# Patient Record
Sex: Male | Born: 1950
Health system: Southern US, Community
[De-identification: ages and names within clinical notes are randomized; demographics above are authoritative.]

## PROBLEM LIST (undated history)

## (undated) ENCOUNTER — Ambulatory Visit: Source: Home / Self Care

## (undated) DIAGNOSIS — I10 Essential (primary) hypertension: Secondary | ICD-10-CM

## (undated) DIAGNOSIS — E119 Type 2 diabetes mellitus without complications: Secondary | ICD-10-CM

## (undated) DIAGNOSIS — H269 Unspecified cataract: Secondary | ICD-10-CM

## (undated) DIAGNOSIS — Z5189 Encounter for other specified aftercare: Secondary | ICD-10-CM

## (undated) DIAGNOSIS — R7302 Impaired glucose tolerance (oral): Secondary | ICD-10-CM

## (undated) DIAGNOSIS — Z9889 Other specified postprocedural states: Secondary | ICD-10-CM

## (undated) DIAGNOSIS — Z87442 Personal history of urinary calculi: Secondary | ICD-10-CM

## (undated) DIAGNOSIS — N289 Disorder of kidney and ureter, unspecified: Secondary | ICD-10-CM

## (undated) DIAGNOSIS — E7800 Pure hypercholesterolemia, unspecified: Secondary | ICD-10-CM

## (undated) DIAGNOSIS — J45909 Unspecified asthma, uncomplicated: Secondary | ICD-10-CM

## (undated) HISTORY — DX: Type 2 diabetes mellitus without complications: E11.9

## (undated) HISTORY — DX: Impaired glucose tolerance (oral): R73.02

## (undated) HISTORY — DX: Essential (primary) hypertension: I10

## (undated) HISTORY — DX: Unspecified cataract: H26.9

## (undated) HISTORY — DX: Unspecified asthma, uncomplicated: J45.909

## (undated) HISTORY — DX: Encounter for other specified aftercare: Z51.89

## (undated) HISTORY — DX: Disorder of kidney and ureter, unspecified: N28.9

## (undated) HISTORY — PX: UMBILICAL HERNIA REPAIR: SUR1181

## (undated) HISTORY — DX: Other specified postprocedural states: Z98.89

## (undated) HISTORY — DX: Personal history of urinary calculi: Z87.442

## (undated) HISTORY — PX: SPLENECTOMY: SUR1306

## (undated) HISTORY — DX: Pure hypercholesterolemia, unspecified: E78.00

---

## 2002-06-24 ENCOUNTER — Encounter: Payer: Self-pay | Admitting: General Surgery

## 2002-06-24 ENCOUNTER — Encounter: Admission: RE | Admit: 2002-06-24 | Discharge: 2002-06-24 | Payer: Self-pay | Admitting: General Surgery

## 2005-02-28 ENCOUNTER — Ambulatory Visit: Payer: Self-pay | Admitting: Internal Medicine

## 2006-06-27 ENCOUNTER — Ambulatory Visit: Payer: Self-pay | Admitting: Internal Medicine

## 2006-09-24 ENCOUNTER — Ambulatory Visit: Payer: Self-pay | Admitting: Internal Medicine

## 2007-09-20 ENCOUNTER — Encounter: Payer: Self-pay | Admitting: *Deleted

## 2007-09-20 DIAGNOSIS — Z9889 Other specified postprocedural states: Secondary | ICD-10-CM | POA: Insufficient documentation

## 2007-09-20 DIAGNOSIS — E7800 Pure hypercholesterolemia, unspecified: Secondary | ICD-10-CM | POA: Insufficient documentation

## 2007-09-20 DIAGNOSIS — I10 Essential (primary) hypertension: Secondary | ICD-10-CM | POA: Insufficient documentation

## 2007-09-20 DIAGNOSIS — Z9089 Acquired absence of other organs: Secondary | ICD-10-CM | POA: Insufficient documentation

## 2007-09-20 HISTORY — DX: Other specified postprocedural states: Z98.89

## 2007-09-20 HISTORY — DX: Pure hypercholesterolemia, unspecified: E78.00

## 2007-09-20 HISTORY — DX: Essential (primary) hypertension: I10

## 2007-12-16 ENCOUNTER — Ambulatory Visit: Payer: Self-pay | Admitting: Internal Medicine

## 2007-12-16 DIAGNOSIS — F528 Other sexual dysfunction not due to a substance or known physiological condition: Secondary | ICD-10-CM | POA: Insufficient documentation

## 2009-01-10 ENCOUNTER — Ambulatory Visit: Payer: Self-pay | Admitting: Internal Medicine

## 2009-01-10 DIAGNOSIS — J45909 Unspecified asthma, uncomplicated: Secondary | ICD-10-CM

## 2009-01-10 DIAGNOSIS — Z9109 Other allergy status, other than to drugs and biological substances: Secondary | ICD-10-CM | POA: Insufficient documentation

## 2009-01-10 HISTORY — DX: Unspecified asthma, uncomplicated: J45.909

## 2009-01-11 ENCOUNTER — Encounter: Payer: Self-pay | Admitting: Internal Medicine

## 2009-01-12 LAB — CONVERTED CEMR LAB
ALT: 28 units/L (ref 0–53)
AST: 28 units/L (ref 0–37)
Albumin: 3.9 g/dL (ref 3.5–5.2)
Alkaline Phosphatase: 127 units/L — ABNORMAL HIGH (ref 39–117)
BUN: 12 mg/dL (ref 6–23)
Bacteria, UA: NEGATIVE
Basophils Absolute: 0.1 10*3/uL (ref 0.0–0.1)
Basophils Relative: 0.6 % (ref 0.0–3.0)
Bilirubin Urine: NEGATIVE
Bilirubin, Direct: 0.2 mg/dL (ref 0.0–0.3)
CO2: 29 meq/L (ref 19–32)
Calcium: 9.5 mg/dL (ref 8.4–10.5)
Chloride: 102 meq/L (ref 96–112)
Cholesterol: 223 mg/dL (ref 0–200)
Creatinine, Ser: 0.8 mg/dL (ref 0.4–1.5)
Crystals: NEGATIVE
Direct LDL: 151.7 mg/dL
Eosinophils Absolute: 0.5 10*3/uL (ref 0.0–0.7)
Eosinophils Relative: 4.3 % (ref 0.0–5.0)
GFR calc Af Amer: 129 mL/min
GFR calc non Af Amer: 106 mL/min
Glucose, Bld: 106 mg/dL — ABNORMAL HIGH (ref 70–99)
HCT: 44.7 % (ref 39.0–52.0)
HDL: 32.5 mg/dL — ABNORMAL LOW (ref >39.0)
Hemoglobin: 15.4 g/dL (ref 13.0–17.0)
Ketones, ur: NEGATIVE mg/dL
Leukocytes, UA: NEGATIVE
Lymphocytes Relative: 35.2 % (ref 12.0–46.0)
MCHC: 34.4 g/dL (ref 30.0–36.0)
MCV: 93.7 fL (ref 78.0–100.0)
Monocytes Absolute: 0.8 10*3/uL (ref 0.1–1.0)
Monocytes Relative: 6.7 % (ref 3.0–12.0)
Mucus, UA: NEGATIVE
Neutro Abs: 6 10*3/uL (ref 1.4–7.7)
Neutrophils Relative %: 53.2 % (ref 43.0–77.0)
Nitrite: NEGATIVE
PSA: 1.17 ng/mL (ref 0.10–4.00)
Platelets: 264 10*3/uL (ref 150–400)
Potassium: 3.6 meq/L (ref 3.5–5.1)
RBC: 4.76 M/uL (ref 4.22–5.81)
RDW: 12.8 % (ref 11.5–14.6)
Sodium: 140 meq/L (ref 135–145)
Specific Gravity, Urine: 1.025 (ref 1.000–1.03)
TSH: 1.34 microintl units/mL (ref 0.35–5.50)
Total Bilirubin: 1 mg/dL (ref 0.3–1.2)
Total CHOL/HDL Ratio: 6.9
Total Protein, Urine: NEGATIVE mg/dL
Total Protein: 7.8 g/dL (ref 6.0–8.3)
Triglycerides: 138 mg/dL (ref 0–149)
Urine Glucose: NEGATIVE mg/dL
Urobilinogen, UA: 1 (ref 0.0–1.0)
VLDL: 28 mg/dL (ref 0–40)
WBC: 11.4 10*3/uL — ABNORMAL HIGH (ref 4.5–10.5)
pH: 6 (ref 5.0–8.0)

## 2009-01-28 ENCOUNTER — Encounter (INDEPENDENT_AMBULATORY_CARE_PROVIDER_SITE_OTHER): Payer: Self-pay | Admitting: *Deleted

## 2009-07-27 ENCOUNTER — Encounter (INDEPENDENT_AMBULATORY_CARE_PROVIDER_SITE_OTHER): Payer: Self-pay | Admitting: *Deleted

## 2009-11-26 HISTORY — PX: COLONOSCOPY: SHX174

## 2010-02-13 ENCOUNTER — Ambulatory Visit: Payer: Self-pay | Admitting: Internal Medicine

## 2010-02-13 DIAGNOSIS — Z87442 Personal history of urinary calculi: Secondary | ICD-10-CM | POA: Insufficient documentation

## 2010-02-13 DIAGNOSIS — R310 Gross hematuria: Secondary | ICD-10-CM

## 2010-02-13 DIAGNOSIS — N529 Male erectile dysfunction, unspecified: Secondary | ICD-10-CM | POA: Insufficient documentation

## 2010-02-13 HISTORY — DX: Personal history of urinary calculi: Z87.442

## 2010-02-13 LAB — CONVERTED CEMR LAB
ALT: 23 units/L (ref 0–53)
AST: 25 units/L (ref 0–37)
Albumin: 4 g/dL (ref 3.5–5.2)
Alkaline Phosphatase: 110 units/L (ref 39–117)
BUN: 12 mg/dL (ref 6–23)
Basophils Absolute: 0.1 10*3/uL (ref 0.0–0.1)
Basophils Relative: 0.7 % (ref 0.0–3.0)
Bilirubin Urine: NEGATIVE
Bilirubin, Direct: 0.2 mg/dL (ref 0.0–0.3)
CO2: 31 meq/L (ref 19–32)
Calcium: 9.5 mg/dL (ref 8.4–10.5)
Chloride: 105 meq/L (ref 96–112)
Cholesterol: 245 mg/dL — ABNORMAL HIGH (ref 0–200)
Creatinine, Ser: 0.9 mg/dL (ref 0.4–1.5)
Direct LDL: 193.9 mg/dL
Eosinophils Absolute: 0.2 10*3/uL (ref 0.0–0.7)
Eosinophils Relative: 2.3 % (ref 0.0–5.0)
GFR calc non Af Amer: 111.72 mL/min (ref >60)
Glucose, Bld: 116 mg/dL — ABNORMAL HIGH (ref 70–99)
HCT: 46 % (ref 39.0–52.0)
HDL: 38.7 mg/dL — ABNORMAL LOW (ref >39.00)
Hemoglobin: 15.3 g/dL (ref 13.0–17.0)
Ketones, ur: NEGATIVE mg/dL
Leukocytes, UA: NEGATIVE
Lymphocytes Relative: 43.7 % (ref 12.0–46.0)
Lymphs Abs: 4.5 10*3/uL — ABNORMAL HIGH (ref 0.7–4.0)
MCHC: 33.2 g/dL (ref 30.0–36.0)
MCV: 96.9 fL (ref 78.0–100.0)
Monocytes Absolute: 0.9 10*3/uL (ref 0.1–1.0)
Monocytes Relative: 9.3 % (ref 3.0–12.0)
Neutro Abs: 4.4 10*3/uL (ref 1.4–7.7)
Neutrophils Relative %: 44 % (ref 43.0–77.0)
Nitrite: NEGATIVE
PSA: 1.45 ng/mL (ref 0.10–4.00)
Platelets: 299 10*3/uL (ref 150.0–400.0)
Potassium: 3.8 meq/L (ref 3.5–5.1)
RBC: 4.75 M/uL (ref 4.22–5.81)
RDW: 13 % (ref 11.5–14.6)
Sodium: 143 meq/L (ref 135–145)
Specific Gravity, Urine: 1.025 (ref 1.000–1.030)
TSH: 1.53 microintl units/mL (ref 0.35–5.50)
Total Bilirubin: 1.3 mg/dL — ABNORMAL HIGH (ref 0.3–1.2)
Total CHOL/HDL Ratio: 6
Total Protein, Urine: NEGATIVE mg/dL
Total Protein: 8.2 g/dL (ref 6.0–8.3)
Triglycerides: 138 mg/dL (ref 0.0–149.0)
Urine Glucose: NEGATIVE mg/dL
Urobilinogen, UA: 1 (ref 0.0–1.0)
VLDL: 27.6 mg/dL (ref 0.0–40.0)
WBC: 10.1 10*3/uL (ref 4.5–10.5)
pH: 6 (ref 5.0–8.0)

## 2010-02-14 LAB — CONVERTED CEMR LAB
Sex Hormone Binding: 27 nmol/L (ref 13–71)
Testosterone Free: 52.4 pg/mL (ref 47.0–244.0)
Testosterone-% Free: 2.2 % (ref 1.6–2.9)
Testosterone: 242.85 ng/dL — ABNORMAL LOW (ref 350–890)

## 2010-02-15 ENCOUNTER — Encounter: Payer: Self-pay | Admitting: Internal Medicine

## 2010-03-03 ENCOUNTER — Encounter (INDEPENDENT_AMBULATORY_CARE_PROVIDER_SITE_OTHER): Payer: Self-pay | Admitting: *Deleted

## 2010-03-08 ENCOUNTER — Encounter: Payer: Self-pay | Admitting: Internal Medicine

## 2010-03-13 ENCOUNTER — Encounter (INDEPENDENT_AMBULATORY_CARE_PROVIDER_SITE_OTHER): Payer: Self-pay | Admitting: *Deleted

## 2010-03-16 ENCOUNTER — Ambulatory Visit: Payer: Self-pay | Admitting: Internal Medicine

## 2010-03-20 ENCOUNTER — Telehealth: Payer: Self-pay | Admitting: Internal Medicine

## 2010-03-22 ENCOUNTER — Encounter: Admission: RE | Admit: 2010-03-22 | Discharge: 2010-03-22 | Payer: Self-pay | Admitting: Internal Medicine

## 2010-03-22 ENCOUNTER — Encounter: Payer: Self-pay | Admitting: Internal Medicine

## 2010-03-22 DIAGNOSIS — N289 Disorder of kidney and ureter, unspecified: Secondary | ICD-10-CM

## 2010-03-22 DIAGNOSIS — N281 Cyst of kidney, acquired: Secondary | ICD-10-CM

## 2010-03-22 HISTORY — DX: Disorder of kidney and ureter, unspecified: N28.9

## 2010-03-28 ENCOUNTER — Encounter: Payer: Self-pay | Admitting: Internal Medicine

## 2010-03-30 ENCOUNTER — Ambulatory Visit: Payer: Self-pay | Admitting: Internal Medicine

## 2010-08-25 ENCOUNTER — Encounter: Admission: RE | Admit: 2010-08-25 | Discharge: 2010-08-25 | Payer: Self-pay | Admitting: Orthopedic Surgery

## 2010-12-26 NOTE — Progress Notes (Signed)
  Phone Note From Pharmacy   [facility]  [address] [phone][facility] Summary of Call: Pharmacy requested clarification on Lotrel 5-20 prescription. Sig does not match quantity pt wants a 30 day supply. Initial call taken by: Scharlene Gloss,  March 20, 2010 2:02 PM  Follow-up for Phone Call        ok to change to #60 - robin to handle Follow-up by: Corwin Levins MD,  March 20, 2010 2:14 PM    Prescriptions: LOTREL 5-20 MG  CAPS (AMLODIPINE BESY-BENAZEPRIL HCL) Take 2 tablets once daily  #60 x 11   Entered by:   Scharlene Gloss   Authorized by:   Corwin Levins MD   Signed by:   Scharlene Gloss on 03/20/2010   Method used:   [facility]ed to ...       [facility]  [address] [phone]* (retail)       [address]       [place], [place]  [id number]       Ph: [phone] or [phone]       Fax: [phone]   RxID:   [id number]

## 2010-12-26 NOTE — Procedures (Signed)
Summary: Colonoscopy  Patient: [name] Note: All result statuses are Final unless otherwise noted.  Tests: (1) Colonoscopy (COL)   COL Colonoscopy           DONE     [facility]     [address]     [place], [place]  [address]           COLONOSCOPY PROCEDURE REPORT           PATIENT:  Benjamin Patton, Benjamin Patton  MR#:  [id number]     BIRTHDATE:  02/08/1951, 58 yrs. old  GENDER:  male     ENDOSCOPIST:  Wilhemina Bonito. Eda Keys, MD     REF. BY:  Screening - recall     PROCEDURE DATE:  03/30/2010     PROCEDURE:  Average-risk screening colonoscopy     G0121     ASA CLASS:  Class II     INDICATIONS:  Routine Risk Screening     MEDICATIONS:   Fentanyl 75 mcg IV, Versed 8 mg IV           DESCRIPTION OF PROCEDURE:   After the risks benefits and     alternatives of the procedure were thoroughly explained, informed     consent was obtained.  Digital rectal exam was performed and     revealed no abnormalities.   The [id number] endoscope was     introduced through the anus and advanced to the cecum, which was     identified by both the appendix and ileocecal valve, limited by     fair prep.Time to cecum = 8:32 min.   The quality of the prep was     Moviprep fair.  The instrument was then slowly withdrawn (time =     10:30 min) as the colon was fully examined.     <<PROCEDUREIMAGES>>           FINDINGS:  A normal appearing cecum, ileocecal valve, and     appendiceal orifice were identified. The ascending, hepatic     flexure, transverse, splenic flexure, descending, sigmoid colon,     and rectum appeared unremarkable.  No polyps or cancers were seen.     fair / adequate prep.   Retroflexed views in the rectum revealed     no abnormalities.    The scope was then withdrawn from the patient     and the procedure completed.           COMPLICATIONS:  None     ENDOSCOPIC IMPRESSION:     1) Normal colon     2) No polyps or cancers     3) Fair / adequate prep           RECOMMENDATIONS:    1) Follow up colonoscopy in 5 years (enhanced prep)           ______________________________     Wilhemina Bonito. Eda Keys, MD           CC:  Corwin Levins, MD;The Patient           n.     Rosalie DoctorWilhemina Bonito. Eda Keys at 03/30/2010 09:31 AM           Junius Argyle, [id number]  Note: An exclamation mark (!) indicates a result that was not dispersed into the flowsheet. Document Creation Date: 03/30/2010 9:31 AM _______________________________________________________________________  (1) Order result status: Final Collection or observation date-time: 03/30/2010 09:21 Requested date-time:  Receipt date-time:  Reported date-time:  Referring Physician:   Ordering Physician: Fransico Setters [phone]) Specimen Source:  Source: Launa Grill Order Number: [id number] Lab site:   Appended Document: Colonoscopy     Procedures Next Due Date:    Colonoscopy: 03/2015

## 2010-12-26 NOTE — Assessment & Plan Note (Signed)
Summary: FU/ NWS #   Vital Signs:  Patient profile:   60 year old male Height:      70.5 inches Weight:      214 pounds BMI:     30.38 O2 Sat:      98 % on Room air Temp:     97.4 degrees F oral Pulse rate:   53 / minute BP sitting:   112 / 80  (left arm) Cuff size:   large  Vitals Entered ByZella Ball Ewing (February 13, 2010 9:18 AM)  O2 Flow:  Room air  CC: followup/RE   CC:  followup/RE.  History of Present Illness: never took the simvastatin  - trying to follow lower chol diet for now and willing to consider statin at [facility] time;  does have a new "AB machine" and seemed to have a "pop" to the back but soon thereafter had some blood in the urine and passed a small stone and no further back pain;  no prior hx of stone or hematuria in the past;  not seen anyone else such as urgent care or ER or urology;  not too excited about gettinng the colon test lst year, but does say he will follow through htis year;  Pt denies CP, sob, doe, wheezing, orthopnea, pnd, worsening LE edema, palps, dizziness or syncope  Pt denies new neuro symptoms such as headache, facial or extremity weakness.  Denies polys, fever or other pain.   Lost 3 lbs from last yr.    Problems Prior to Update: 1)  Preventive Health Care  (ICD-V70.0) 2)  Asthma  (ICD-493.90) 3)  Erectile Dysfunction  (ICD-302.72) 4)  Preventive Health Care  (ICD-V70.0) 5)  Tonsillectomy and Adenoidectomy, Hx of  (ICD-V45.79) 6)  Right Testicular Surgery  () 7)  Inguinal Herniorrhaphy, Hx of  (ICD-V45.89) 8)  Colectomy, Hx of  (ICD-V15.2) 9)  Splenectomy, Hx of  (ICD-V45.79) 10)  Hypercholesterolemia  (ICD-272.0) 11)  Hypertension  (ICD-401.9)  Medications Prior to Update: 1)  Lotrel 5-20 Mg  Caps (Amlodipine Besy-Benazepril Hcl) .... Take 2 Tablets Once Daily 2)  Adult Aspirin Ec Low Strength 81 Mg Tbec (Aspirin) .Marland Kitchen.. 1 By Mouth Once Daily 3)  Symbicort 160-4.5 Mcg/act Aero (Budesonide-Formoterol Fumarate) .... 2 Puffs Two Times A Day 4)   Proair Hfa 108 (90 Base) Mcg/act Aers (Albuterol Sulfate) .... 2 Puffs Up To Four Times Per Day As Needed 5)  Simvastatin 40 Mg Tabs (Simvastatin) .Marland Kitchen.. 1 By Mouth Once Daily  Current Medications (verified): 1)  Lotrel 5-20 Mg  Caps (Amlodipine Besy-Benazepril Hcl) .... Take 2 Tablets Once Daily 2)  Adult Aspirin Ec Low Strength 81 Mg Tbec (Aspirin) .Marland Kitchen.. 1 By Mouth Once Daily 3)  Cialis 20 Mg Tabs (Tadalafil) .Marland Kitchen.. 1 By Mouth Every Other Day As Needed  Allergies (verified): No Known Drug Allergies  Past History:  Past Surgical History: Last updated: 12/16/2007 TONSILLECTOMY AND ADENOIDECTOMY, HX OF (ICD-V45.79) * RIGHT TESTICULAR SURGERY COLECTOMY, HX OF (ICD-V15.2) SPLENECTOMY, HX OF (ICD-V45.79) s/p ventral hernia repair  Family History: Last updated: 12/16/2007 uncel with colon polyps, HTN, stroke  Social History: Last updated: 12/16/2007 Never Smoked Alcohol use-no Single works machine maintenance  Risk Factors: Smoking Status: never (12/16/2007)  Past Medical History: HYPERCHOLESTEROLEMIA (ICD-272.0) HYPERTENSION (ICD-401.9) Asthma Nephrolithiasis, hx of - per pt hx E.D.   Review of Systems  The patient denies anorexia, fever, weight loss, vision loss, decreased hearing, hoarseness, chest pain, syncope, dyspnea on exertion, peripheral edema, prolonged cough, headaches, hemoptysis, abdominal pain,  melena, hematochezia, severe indigestion/heartburn, hematuria, muscle weakness, suspicious skin lesions, transient blindness, difficulty walking, depression, unusual weight change, abnormal bleeding, enlarged lymph nodes, and angioedema.         all otherwise negative per pt -  except has some ED symptoms worsening in the past year,  Physical Exam  General:  alert and overweight-appearing.   Head:  normocephalic and atraumatic.   Eyes:  vision grossly intact, pupils equal, and pupils round.   Ears:  R ear normal and no external deformities.   Nose:  no external  deformity and no nasal discharge.   Mouth:  no gingival abnormalities and pharynx pink and moist.   Neck:  supple and no masses.   Lungs:  normal respiratory effort and normal breath sounds.   Heart:  normal rate and regular rhythm.   Abdomen:  soft, non-tender, and normal bowel sounds.   Genitalia:  Testes bilaterally descended without nodularity, tenderness or masses. No scrotal masses or lesions. No penis lesions or urethral discharge. Msk:  no joint tenderness and no joint swelling.   Extremities:  no edema, no erythema  Neurologic:  cranial nerves II-XII intact and strength normal in all extremities.     Impression & Recommendations:  Problem # 1:  Preventive Health Care (ICD-V70.0)  Overall doing well, age appropriate education and counseling updated and referral for appropriate preventive services done unless declined, immunizations up to date or declined, diet counseling done if overweight, urged to quit smoking if smokes , most recent labs reviewed and current ordered if appropriate, ecg reviewed or declined (interpretation per ECG scanned in the EMR if done); information regarding Medicare Prevention requirements given if appropriate ; due for colonoscopy  Orders: Gastroenterology Referral (GI) TLB-BMP (Basic Metabolic Panel-BMET) (80048-METABOL) TLB-CBC Platelet - w/Differential (85025-CBCD) TLB-Hepatic/Liver Function Pnl (80076-HEPATIC) TLB-Lipid Panel (80061-LIPID) TLB-TSH (Thyroid Stimulating Hormone) (84443-TSH) TLB-PSA (Prostate Specific Antigen) (84153-PSA) TLB-Udip w/ Micro (81001-URINE)  Problem # 2:  GROSS HEMATURIA (ICD-599.71)  prob stone by hx, but not proven; will check renal u/s - r/o obstruction, refer urology to see if further eval felt needed, check urine with above  Orders: Radiology Referral (Radiology) Urology Referral (Urology)  Problem # 3:  ASTHMA (ICD-493.90)  The following medications were removed from the medication list:    Symbicort  160-4.5 Mcg/act Aero (Budesonide-formoterol fumarate) .Marland Kitchen... 2 puffs two times a day    Proair Hfa 108 (90 Base) Mcg/act Aers (Albuterol sulfate) .Marland Kitchen... 2 puffs up to four times per day as needed stable overall by hx and exam, ok to continue meds/tx as is - pt declines refills meds  Problem # 4:  HYPERCHOLESTEROLEMIA (ICD-272.0)  The following medications were removed from the medication list:    Simvastatin 40 Mg Tabs (Simvastatin) .Marland Kitchen... 1 by mouth once daily  Labs Reviewed: SGOT: 28 (01/10/2009)   SGPT: 28 (01/10/2009)   HDL:32.5 (01/10/2009)  LDL:DEL (01/10/2009)  Chol:223 (01/10/2009)  Trig:138 (01/10/2009) pt declines statin, Pt to continue diet efforts,  to check labs - goal LDL less than 70   Problem # 5:  ERECTILE DYSFUNCTION, ORGANIC (ICD-607.84)  to check testosterone; for cialis tx  Orders: T-Testosterone, Free and Total [phone])  His updated medication list for this problem includes:    Cialis 20 Mg Tabs (Tadalafil) .Marland Kitchen... 1 by mouth every other day as needed  Problem # 6:  SPLENECTOMY, HX OF (ICD-V45.79) for pneumovax today  Complete Medication List: 1)  Lotrel 5-20 Mg Caps (Amlodipine besy-benazepril hcl) .... Take 2 tablets once daily 2)  Adult Aspirin Ec Low Strength 81 Mg Tbec (Aspirin) .Marland Kitchen.. 1 by mouth once daily 3)  Cialis 20 Mg Tabs (Tadalafil) .Marland Kitchen.. 1 by mouth every other day as needed  Patient Instructions: 1)  Please take all new medications as prescribed  2)  Continue all previous medications as before this visit  3)  Please go to the Lab in the basement for your blood and/or urine tests today  4)  You will be contacted about the referral(s) to: colonoscopy, kidney ultrasound, and urology 5)  you had the pneumonia shot today 6)  Please schedule a follow-up appointment in 1 year or sooner if needed Prescriptions: CIALIS 20 MG TABS (TADALAFIL) 1 by mouth every other day as needed  #5 x 11   Entered and Authorized by:   Corwin Levins MD   Signed by:    Corwin Levins MD on 02/13/2010   Method used:   Print then Give to Patient   RxID:   [id number] LOTREL 5-20 MG  CAPS (AMLODIPINE BESY-BENAZEPRIL HCL) Take 2 tablets once daily  #30 x 11   Entered and Authorized by:   Corwin Levins MD   Signed by:   Corwin Levins MD on 02/13/2010   Method used:   Print then Give to Patient   RxID:   [id number]   Appended Document: Immunization Entry      Immunizations Administered:  Pneumonia Vaccine:    Vaccine Type: Pneumovax    Site: left deltoid    Mfr: Merck    Dose: 0.5 ml    Route: IM    Given by: Zella Ball Ewing    Exp. Date: 07/10/2011    Lot #: [id number]    VIS given: 06/23/96 version given February 13, 2010.

## 2010-12-26 NOTE — Letter (Signed)
Summary: Previsit letter  [facility]  [address] [place], [place] [address]   Phone: [phone]  Fax: [phone]       03/03/2010 MRN: [id number]  [facility] [address] [place], [place]  [address]  Dear [name],  Welcome to the Gastroenterology Division at [facility].    You are scheduled to see a nurse for your pre-procedure visit on 03-16-10 at 9am on the 3rd floor at [facility], [address]  We ask that you try to arrive at our office 15 minutes prior to your appointment time to allow for check-in.  Your nurse visit will consist of discussing your medical and surgical history, your immediate family medical history, and your medications.    Please bring a complete list of all your medications or, if you prefer, bring the medication bottles and we will list them.  We will need to be aware of both prescribed and over the counter drugs.  We will need to know exact dosage information as well.  If you are on blood thinners (Coumadin, Plavix, Aggrenox, Ticlid, etc.) please call our office today/prior to your appointment, as we need to consult with your physician about holding your medication.   Please be prepared to read and sign documents such as consent forms, a financial agreement, and acknowledgement forms.  If necessary, and with your consent, a friend or relative is welcome to sit-in on the nurse visit with you.  Please bring your insurance card so that we may make a copy of it.  If your insurance requires a referral to see a specialist, please bring your referral form from your primary care physician.  No co-pay is required for this nurse visit.     If you cannot keep your appointment, please call [phone] to cancel or reschedule prior to your appointment date.  This allows [place] the opportunity to schedule an appointment for another patient in need of care.    Thank you for choosing [facility] for your medical needs.  We  appreciate the opportunity to care for you.  Please visit [place] at our website  to learn more about our practice.                     Sincerely.                                                                                                                   The Gastroenterology Division

## 2010-12-26 NOTE — Miscellaneous (Signed)
Summary: Orders Update  Clinical Lists Changes  Problems: Added new problem of UNSPECIFIED DISORDER OF KIDNEY AND URETER (ICD-593.9) Orders: Added new Referral order of Urology Referral (Urology) - Signed

## 2010-12-26 NOTE — Consult Note (Signed)
Summary: [facility]   Imported By: Lester [place] 02/21/2010 09:12:12  _____________________________________________________________________  External Attachment:    Type:   Image     Comment:   External Document

## 2010-12-26 NOTE — Consult Note (Signed)
Summary: [facility]   Imported By: Sherian Rein 03/31/2010 14:08:00  _____________________________________________________________________  External Attachment:    Type:   Image     Comment:   External Document

## 2010-12-26 NOTE — Letter (Signed)
Summary: [facility]   Imported By: Sherian Rein 03/14/2010 08:28:08  _____________________________________________________________________  External Attachment:    Type:   Image     Comment:   External Document

## 2010-12-26 NOTE — Letter (Signed)
Summary: [facility] Instructions  [facility]  [address] [place], [place] [address]   Phone: [phone]  Fax: [phone]       DERYCK HIPPLER    [birth year]    MRN: [id number]        Procedure Day /Date: Thursday 03/30/10     Arrival Time: 7:30 a.m.     Procedure Time: 8:30 a.m.     Location of Procedure:                    _x_   Endoscopy Center (4th Floor)                        PREPARATION FOR COLONOSCOPY WITH MOVIPREP   Starting 5 days prior to your procedure  03/25/10 do not eat nuts, seeds, popcorn, corn, beans, peas,  salads, or any raw vegetables.  Do not take any fiber supplements (e.g. Metamucil, Citrucel, and Benefiber).  THE DAY BEFORE YOUR PROCEDURE         DATE:  03/29/10 DAY: Wednesday  1.  Drink clear liquids the entire day-NO SOLID FOOD  2.  Do not drink anything colored red or purple.  Avoid juices with pulp.  No orange juice.  3.  Drink at least 64 oz. (8 glasses) of fluid/clear liquids during the day to prevent dehydration and help the prep work efficiently.  CLEAR LIQUIDS INCLUDE: Water Jello Ice Popsicles Tea (sugar ok, no milk/cream) Powdered fruit flavored drinks Coffee (sugar ok, no milk/cream) Gatorade Juice: apple, white grape, white cranberry  Lemonade Clear bullion, consomm, broth Carbonated beverages (any kind) Strained chicken noodle soup Hard Candy                             4.  In the morning, mix first dose of MoviPrep solution:    Empty 1 Pouch A and 1 Pouch B into the disposable container    Add lukewarm drinking water to the top line of the container. Mix to dissolve    Refrigerate (mixed solution should be used within 24 hrs)  5.  Begin drinking the prep at 5:00 p.m. The MoviPrep container is divided by 4 marks.   Every 15 minutes drink the solution down to the next mark (approximately 8 oz) until the full liter is complete.   6.  Follow completed prep with 16 oz of clear liquid of your choice  (Nothing red or purple).  Continue to drink clear liquids until bedtime.  7.  Before going to bed, mix second dose of MoviPrep solution:    Empty 1 Pouch A and 1 Pouch B into the disposable container    Add lukewarm drinking water to the top line of the container. Mix to dissolve    Refrigerate  THE DAY OF YOUR PROCEDURE      DATE: 03/30/10  DAY: Thursday  Beginning at 3:30 a.m. (5 hours before procedure):         1. Every 15 minutes, drink the solution down to the next mark (approx 8 oz) until the full liter is complete.  2. Follow completed prep with 16 oz. of clear liquid of your choice.    3. You may drink clear liquids until  6:30 a.m.  (2 HOURS BEFORE PROCEDURE).   MEDICATION INSTRUCTIONS  Unless otherwise instructed, you should take regular prescription medications with a small sip of water   as early as possible  the morning of your procedure.         OTHER INSTRUCTIONS  You will need a responsible adult at least 59 years of age to accompany you and drive you home.   This person must remain in the waiting room during your procedure.  Wear loose fitting clothing that is easily removed.  Leave jewelry and other valuables at home.  However, you may wish to bring a book to read or  an iPod/MP3 player to listen to music as you wait for your procedure to start.  Remove all body piercing jewelry and leave at home.  Total time from sign-in until discharge is approximately 2-3 hours.  You should go home directly after your procedure and rest.  You can resume normal activities the  day after your procedure.  The day of your procedure you should not:   Drive   Make legal decisions   Operate machinery   Drink alcohol   Return to work  You will receive specific instructions about eating, activities and medications before you leave.    The above instructions have been reviewed and explained to me by   Wyona Almas RN  March 16, 2010 9:29 AM     I fully  understand and can verbalize these instructions _____________________________ Date _________

## 2010-12-26 NOTE — Miscellaneous (Signed)
Summary: [facility] Previsit/prep  Clinical Lists Changes  Medications: Added new medication of MOVIPREP 100 GM  SOLR (PEG-KCL-NACL-NASULF-NA ASC-C) As per prep instructions. - Signed Rx of MOVIPREP 100 GM  SOLR (PEG-KCL-NACL-NASULF-NA ASC-C) As per prep instructions.;  #1 x 0;  Signed;  Entered by: Wyona Almas RN;  Authorized by: Hilarie Fredrickson MD;  Method used: [facility]  [address] [phone]*, [address], [place], [place], [place]  [id number], Ph: [phone] or [phone], Fax: [phone] Observations: Added new observation of NKA: T (03/16/2010 8:51)    Prescriptions: MOVIPREP 100 GM  SOLR (PEG-KCL-NACL-NASULF-NA ASC-C) As per prep instructions.  #1 x 0   Entered by:   Wyona Almas RN   Authorized by:   Hilarie Fredrickson MD   Signed by:   Wyona Almas RN on 03/16/2010   Method used:   [facility]ronically to        [facility]  [address] [phone]* (retail)       [address]       [place], [place]  [id number]       Ph: [phone] or [phone]       Fax: [phone]   RxID:   [id number]

## 2011-04-02 ENCOUNTER — Other Ambulatory Visit: Payer: Self-pay

## 2011-04-02 MED ORDER — AMLODIPINE BESY-BENAZEPRIL HCL 5-20 MG PO CAPS: 1.0000 | ORAL_CAPSULE | Freq: Two times a day (BID) | ORAL | Status: DC

## 2011-05-06 ENCOUNTER — Other Ambulatory Visit: Payer: Self-pay | Admitting: Internal Medicine

## 2011-05-08 ENCOUNTER — Encounter: Payer: Self-pay | Admitting: Internal Medicine

## 2011-05-08 DIAGNOSIS — Z0001 Encounter for general adult medical examination with abnormal findings: Secondary | ICD-10-CM | POA: Insufficient documentation

## 2011-05-08 DIAGNOSIS — Z0000 Encounter for general adult medical examination without abnormal findings: Secondary | ICD-10-CM | POA: Insufficient documentation

## 2011-05-09 ENCOUNTER — Other Ambulatory Visit (INDEPENDENT_AMBULATORY_CARE_PROVIDER_SITE_OTHER): Payer: BC Managed Care – PPO

## 2011-05-09 ENCOUNTER — Other Ambulatory Visit: Payer: Self-pay | Admitting: Internal Medicine

## 2011-05-09 ENCOUNTER — Encounter: Payer: Self-pay | Admitting: Internal Medicine

## 2011-05-09 ENCOUNTER — Ambulatory Visit (INDEPENDENT_AMBULATORY_CARE_PROVIDER_SITE_OTHER): Payer: BC Managed Care – PPO | Admitting: Internal Medicine

## 2011-05-09 VITALS — BP 108/82 | HR 49 | Temp 97.2°F | Ht 70.0 in | Wt 213.4 lb

## 2011-05-09 DIAGNOSIS — Z0000 Encounter for general adult medical examination without abnormal findings: Secondary | ICD-10-CM

## 2011-05-09 DIAGNOSIS — N289 Disorder of kidney and ureter, unspecified: Secondary | ICD-10-CM

## 2011-05-09 LAB — BASIC METABOLIC PANEL
BUN: 11 mg/dL (ref 6–23)
CO2: 29 mEq/L (ref 19–32)
Chloride: 106 mEq/L (ref 96–112)
Creatinine, Ser: 0.8 mg/dL (ref 0.4–1.5)
Glucose, Bld: 114 mg/dL — ABNORMAL HIGH (ref 70–99)

## 2011-05-09 LAB — CBC WITH DIFFERENTIAL/PLATELET
Basophils Absolute: 0.1 10*3/uL (ref 0.0–0.1)
Eosinophils Absolute: 0.3 10*3/uL (ref 0.0–0.7)
Lymphocytes Relative: 40.4 % (ref 12.0–46.0)
MCHC: 34.5 g/dL (ref 30.0–36.0)
MCV: 95 fl (ref 78.0–100.0)
Monocytes Absolute: 1.3 10*3/uL — ABNORMAL HIGH (ref 0.1–1.0)
Neutrophils Relative %: 45.7 % (ref 43.0–77.0)
RDW: 13.7 % (ref 11.5–14.6)

## 2011-05-09 LAB — LDL CHOLESTEROL, DIRECT: Direct LDL: 156.3 mg/dL

## 2011-05-09 LAB — PSA: PSA: 0.88 ng/mL (ref 0.10–4.00)

## 2011-05-09 LAB — HEPATIC FUNCTION PANEL
Albumin: 4.1 g/dL (ref 3.5–5.2)
Alkaline Phosphatase: 119 U/L — ABNORMAL HIGH (ref 39–117)
Bilirubin, Direct: 0.1 mg/dL (ref 0.0–0.3)

## 2011-05-09 LAB — URINALYSIS, ROUTINE W REFLEX MICROSCOPIC
Leukocytes, UA: NEGATIVE
Nitrite: NEGATIVE
Specific Gravity, Urine: 1.02 (ref 1.000–1.030)
pH: 6.5 (ref 5.0–8.0)

## 2011-05-09 LAB — LIPID PANEL
Total CHOL/HDL Ratio: 7
Triglycerides: 174 mg/dL — ABNORMAL HIGH (ref 0.0–149.0)

## 2011-05-09 MED ORDER — AMLODIPINE BESY-BENAZEPRIL HCL 5-20 MG PO CAPS: ORAL_CAPSULE | ORAL | Status: DC

## 2011-05-09 MED ORDER — ASPIRIN EC 81 MG PO TBEC: 81.0000 mg | DELAYED_RELEASE_TABLET | Freq: Every day | ORAL | Status: AC
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
## 2011-05-09 NOTE — Patient Instructions (Signed)
Continue all other medications as before Please go to LAB in the Basement for the blood and/or urine tests to be done today Please call the phone number [phone] (the [employer] System) for results of testing in 2-3 days;  When calling, simply dial the number, and when prompted enter the MRN number above (the Medical Record Number) and the # key, then the message should start. Please return in 1 year for your yearly visit, or sooner if needed, with Lab testing done 3-5 days before  

## 2011-11-14 ENCOUNTER — Ambulatory Visit: Payer: BC Managed Care – PPO | Admitting: Internal Medicine

## 2012-05-30 ENCOUNTER — Other Ambulatory Visit: Payer: Self-pay | Admitting: Internal Medicine

## 2012-09-24 ENCOUNTER — Other Ambulatory Visit: Payer: Self-pay | Admitting: Internal Medicine

## 2013-05-06 ENCOUNTER — Other Ambulatory Visit (INDEPENDENT_AMBULATORY_CARE_PROVIDER_SITE_OTHER): Payer: Managed Care, Other (non HMO)

## 2013-05-06 ENCOUNTER — Encounter: Payer: Self-pay | Admitting: Internal Medicine

## 2013-05-06 ENCOUNTER — Ambulatory Visit (INDEPENDENT_AMBULATORY_CARE_PROVIDER_SITE_OTHER): Payer: Managed Care, Other (non HMO) | Admitting: Internal Medicine

## 2013-05-06 VITALS — BP 112/80 | HR 79 | Temp 98.9°F | Ht 70.5 in | Wt 204.2 lb

## 2013-05-06 DIAGNOSIS — M545 Low back pain, unspecified: Secondary | ICD-10-CM

## 2013-05-06 DIAGNOSIS — Z0000 Encounter for general adult medical examination without abnormal findings: Secondary | ICD-10-CM

## 2013-05-06 LAB — CBC WITH DIFFERENTIAL/PLATELET
Eosinophils Relative: 2 % (ref 0.0–5.0)
Lymphocytes Relative: 34.9 % (ref 12.0–46.0)
Monocytes Absolute: 0.9 10*3/uL (ref 0.1–1.0)
Monocytes Relative: 7 % (ref 3.0–12.0)
Neutrophils Relative %: 55.6 % (ref 43.0–77.0)
Platelets: 300 10*3/uL (ref 150.0–400.0)
WBC: 12.7 10*3/uL — ABNORMAL HIGH (ref 4.5–10.5)

## 2013-05-06 LAB — BASIC METABOLIC PANEL
BUN: 13 mg/dL (ref 6–23)
Chloride: 108 mEq/L (ref 96–112)
Creatinine, Ser: 0.9 mg/dL (ref 0.4–1.5)
Glucose, Bld: 134 mg/dL — ABNORMAL HIGH (ref 70–99)
Potassium: 3.7 mEq/L (ref 3.5–5.1)

## 2013-05-06 LAB — HEPATIC FUNCTION PANEL
ALT: 19 U/L (ref 0–53)
AST: 24 U/L (ref 0–37)
Albumin: 3.9 g/dL (ref 3.5–5.2)
Total Protein: 7.4 g/dL (ref 6.0–8.3)

## 2013-05-06 LAB — URINALYSIS, ROUTINE W REFLEX MICROSCOPIC
Bilirubin Urine: NEGATIVE
Leukocytes, UA: NEGATIVE
Nitrite: NEGATIVE
Specific Gravity, Urine: 1.025 (ref 1.000–1.030)
pH: 6 (ref 5.0–8.0)

## 2013-05-06 LAB — LIPID PANEL
Cholesterol: 176 mg/dL (ref 0–200)
LDL Cholesterol: 111 mg/dL — ABNORMAL HIGH (ref 0–99)
VLDL: 33.8 mg/dL (ref 0.0–40.0)

## 2013-05-06 LAB — TSH: TSH: 0.71 u[IU]/mL (ref 0.35–5.50)

## 2013-05-06 MED ORDER — AMLODIPINE BESY-BENAZEPRIL HCL 5-20 MG PO CAPS: ORAL_CAPSULE | ORAL | Status: DC

## 2013-05-06 MED ORDER — ASPIRIN 81 MG PO TBEC: 81.0000 mg | DELAYED_RELEASE_TABLET | Freq: Every day | ORAL | Status: DC

## 2013-05-06 NOTE — Progress Notes (Signed)
Subjective:    Patient ID: Benjamin Patton, male    DOB: 09-04-1951, 62 y.o.   MRN: [id number]  HPI  Here for wellness and f/u;  Overall doing ok;  Pt denies CP, worsening SOB, DOE, wheezing, orthopnea, PND, worsening LE edema, palpitations, dizziness or syncope.  Pt denies neurological change such as new headache, facial or extremity weakness.  Pt denies polydipsia, polyuria, or low sugar symptoms. Pt states overall good compliance with treatment and medications, good tolerability, and has been trying to follow lower cholesterol diet.  Pt denies worsening depressive symptoms, suicidal ideation or panic. No fever, night sweats, wt loss, loss of appetite, or other constitutional symptoms.  Pt states good ability with ADL's, has low fall risk, home safety reviewed and adequate, no other significant changes in hearing or vision, and only occasionally active with exercise. Did have some right lower back pain x 2 days, ? Passed stone, pain now resolved, no fever and Denies urinary symptoms such as dysuria, frequency, urgency, flank pain, hematuria or n/v, fever, chills. Past Medical History  Diagnosis Date  . ASTHMA 01/10/2009  . HYPERCHOLESTEROLEMIA 09/20/2007  . HYPERTENSION 09/20/2007  . COLECTOMY, HX OF 09/20/2007  . ERECTILE DYSFUNCTION 12/16/2007  . INGUINAL HERNIORRHAPHY, HX OF 09/20/2007  . NEPHROLITHIASIS, HX OF 02/13/2010  . 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 Unspecified disorder of kidney and ureter 03/22/2010   No past surgical history on file.  reports that he has never smoked. He does not have any smokeless tobacco history on file. His alcohol and drug histories are not on file. family history is not on file. No Known Allergies No current outpatient prescriptions on file prior to visit.   No current facility-administered medications on file prior to visit.   Review of Systems Constitutional: Negative for diaphoresis, activity change, appetite change or unexpected weight  change.  HENT: Negative for hearing loss, ear pain, facial swelling, mouth sores and neck stiffness.   Eyes: Negative for pain, redness and visual disturbance.  Respiratory: Negative for shortness of breath and wheezing.   Cardiovascular: Negative for chest pain and palpitations.  Gastrointestinal: Negative for diarrhea, blood in stool, abdominal distention or other pain Genitourinary: Negative for hematuria, flank pain or change in urine volume.  Musculoskeletal: Negative for myalgias and joint swelling.  Skin: Negative for color change and wound.  Neurological: Negative for syncope and numbness. other than noted Hematological: Negative for adenopathy.  Psychiatric/Behavioral: Negative for hallucinations, self-injury, decreased concentration and agitation.      Objective:   Physical Exam BP 112/80  Pulse 79  Temp(Src) 98.9 F (37.2 C) (Oral)  Ht 5' 10.5" (1.791 m)  Wt 204 lb 4 oz (92.647 kg)  BMI 28.88 kg/m2  SpO2 96% VS noted,  Constitutional: Pt is oriented to person, place, and time. Appears well-developed and well-nourished.  Head: Normocephalic and atraumatic.  Right Ear: External ear normal.  Left Ear: External ear normal.  Nose: Nose normal.  Mouth/Throat: Oropharynx is clear and moist.  Eyes: Conjunctivae and EOM are normal. Pupils are equal, round, and reactive to light.  Neck: Normal range of motion. Neck supple. No JVD present. No tracheal deviation present.  Cardiovascular: Normal rate, regular rhythm, normal heart sounds and intact distal pulses.   Pulmonary/Chest: Effort normal and breath sounds normal.  Abdominal: Soft. Bowel sounds are normal. There is no tenderness. No HSM  Musculoskeletal: Normal range of motion. Exhibits no edema.  Lymphadenopathy:  Has no cervical adenopathy.  Neurological: Pt is alert and  oriented to person, place, and time. Pt has normal reflexes. No cranial nerve deficit. , motor 5/5, spine nontender, no paravertebral tender or spasm  noted Skin: Skin is warm and dry. No rash noted.  Psychiatric:  Has  normal mood and affect. Behavior is normal.     Assessment & Plan:

## 2013-05-06 NOTE — Patient Instructions (Addendum)
Please continue all other medications as before, and refills have been done if requested.  Please also start Aspirin 81 mg - 1 per day - enteric coated only, to help reduce risk of stroke and heart attack  You can also use Tylenol or advil as needed for pain if recurs  Please go to the LAB in the Basement (turn left off the elevator) for the tests to be done today You will be contacted by phone if any changes need to be made immediately.  Otherwise, you will receive a letter about your results with an explanation  If the urine testing shows blood, we may need to have a CT scan done to look for a kidney stone  Please remember to sign up for My Chart if you have not done so, as this will be important to you in the future with finding out test results, communicating by private email, and scheduling acute appointments online when needed.  Please return in 1 year for your yearly visit, or sooner if needed, with Lab testing done 3-5 days before

## 2013-05-06 NOTE — Assessment & Plan Note (Signed)
Resolved, ? msk vs renal stone vs other- for UA with labs today, consider CT

## 2013-05-06 NOTE — Assessment & Plan Note (Signed)

## 2013-05-07 ENCOUNTER — Encounter: Payer: Self-pay | Admitting: Internal Medicine

## 2013-05-07 ENCOUNTER — Ambulatory Visit: Payer: Managed Care, Other (non HMO)

## 2013-05-07 DIAGNOSIS — R7309 Other abnormal glucose: Secondary | ICD-10-CM

## 2013-05-07 LAB — HEMOGLOBIN A1C: Hgb A1c MFr Bld: 6.6 % — ABNORMAL HIGH (ref 4.6–6.5)

## 2013-09-05 ENCOUNTER — Ambulatory Visit (INDEPENDENT_AMBULATORY_CARE_PROVIDER_SITE_OTHER): Payer: Managed Care, Other (non HMO) | Admitting: Family Medicine

## 2013-09-05 ENCOUNTER — Ambulatory Visit: Payer: Managed Care, Other (non HMO)

## 2013-09-05 VITALS — BP 132/80 | HR 53 | Temp 99.0°F | Resp 17 | Ht 71.0 in | Wt 204.0 lb

## 2013-09-05 DIAGNOSIS — M545 Low back pain: Secondary | ICD-10-CM

## 2013-09-05 DIAGNOSIS — Z87442 Personal history of urinary calculi: Secondary | ICD-10-CM

## 2013-09-05 DIAGNOSIS — R109 Unspecified abdominal pain: Secondary | ICD-10-CM

## 2013-09-05 DIAGNOSIS — N289 Disorder of kidney and ureter, unspecified: Secondary | ICD-10-CM

## 2013-09-05 LAB — COMPREHENSIVE METABOLIC PANEL
ALT: 26 U/L (ref 0–53)
AST: 28 U/L (ref 0–37)
Albumin: 4.1 g/dL (ref 3.5–5.2)
Alkaline Phosphatase: 112 U/L (ref 39–117)
BUN: 11 mg/dL (ref 6–23)
Creat: 0.82 mg/dL (ref 0.50–1.35)
Glucose, Bld: 109 mg/dL — ABNORMAL HIGH (ref 70–99)
Potassium: 4.1 mEq/L (ref 3.5–5.3)
Sodium: 139 mEq/L (ref 135–145)
Total Bilirubin: 0.8 mg/dL (ref 0.3–1.2)
Total Protein: 7.4 g/dL (ref 6.0–8.3)

## 2013-09-05 LAB — POCT URINALYSIS DIPSTICK
Glucose, UA: NEGATIVE
Nitrite, UA: NEGATIVE
Protein, UA: NEGATIVE
Urobilinogen, UA: 1

## 2013-09-05 LAB — POCT UA - MICROSCOPIC ONLY
Casts, Ur, LPF, POC: NEGATIVE
Crystals, Ur, HPF, POC: NEGATIVE
WBC, Ur, HPF, POC: NEGATIVE
Yeast, UA: NEGATIVE

## 2013-09-05 LAB — POCT CBC
Granulocyte percent: 50.7 %G (ref 37–80)
MCH, POC: 32 pg — AB (ref 27–31.2)
MCV: 99.7 fL — AB (ref 80–97)
MID (cbc): 0.7 (ref 0–0.9)
POC LYMPH PERCENT: 41.5 %L (ref 10–50)
Platelet Count, POC: 324 10*3/uL (ref 142–424)
RDW, POC: 13.6 %

## 2013-09-05 MED ORDER — MELOXICAM 15 MG PO TABS: 15.0000 mg | ORAL_TABLET | Freq: Every day | ORAL | Status: DC

## 2013-09-05 MED ORDER — KETOROLAC TROMETHAMINE 60 MG/2ML IM SOLN
60.0000 mg | Freq: Once | INTRAMUSCULAR | Status: AC
  Administered 2013-09-05: 60 mg via INTRAMUSCULAR
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
## 2013-09-05 NOTE — Patient Instructions (Signed)
Unsure what is causing your pain but it could be inflammation of your ribs or diaphragm so start taking the meloxicam every morning for about 2 wks.  In the meantime if the pain doesn't go away or it comes back, come back and see me and we can re-examine you and decide whether you need to get a CT scan.  You could very well have a kidney stone, but I don't think it has anything to do with your current pain.

## 2013-09-08 ENCOUNTER — Encounter: Payer: Self-pay | Admitting: Family Medicine

## 2013-11-01 ENCOUNTER — Ambulatory Visit: Payer: Managed Care, Other (non HMO)

## 2013-11-01 ENCOUNTER — Ambulatory Visit (INDEPENDENT_AMBULATORY_CARE_PROVIDER_SITE_OTHER): Payer: Managed Care, Other (non HMO) | Admitting: Family Medicine

## 2013-11-01 DIAGNOSIS — M25519 Pain in unspecified shoulder: Secondary | ICD-10-CM

## 2013-11-01 DIAGNOSIS — M25512 Pain in left shoulder: Secondary | ICD-10-CM
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
## 2014-04-16 ENCOUNTER — Other Ambulatory Visit: Payer: Self-pay | Admitting: Internal Medicine

## 2014-05-17 ENCOUNTER — Other Ambulatory Visit: Payer: Self-pay | Admitting: Internal Medicine

## 2014-06-14 ENCOUNTER — Ambulatory Visit (INDEPENDENT_AMBULATORY_CARE_PROVIDER_SITE_OTHER): Payer: BC Managed Care – PPO | Admitting: Family Medicine

## 2014-06-14 ENCOUNTER — Ambulatory Visit (INDEPENDENT_AMBULATORY_CARE_PROVIDER_SITE_OTHER): Payer: BC Managed Care – PPO

## 2014-06-14 VITALS — BP 114/70 | HR 66 | Temp 97.7°F | Resp 16 | Ht 70.0 in | Wt 203.6 lb

## 2014-06-14 DIAGNOSIS — M25559 Pain in unspecified hip: Secondary | ICD-10-CM

## 2014-06-14 DIAGNOSIS — M25551 Pain in right hip: Secondary | ICD-10-CM

## 2014-06-14 DIAGNOSIS — T148XXA Other injury of unspecified body region, initial encounter: Secondary | ICD-10-CM

## 2014-06-14 DIAGNOSIS — M545 Low back pain, unspecified: Secondary | ICD-10-CM

## 2014-06-14 MED ORDER — MELOXICAM 15 MG PO TABS: 15.0000 mg | ORAL_TABLET | Freq: Every day | ORAL | Status: DC

## 2014-06-14 MED ORDER — HYDROCODONE-ACETAMINOPHEN 5-325 MG PO TABS: 1.0000 | ORAL_TABLET | Freq: Three times a day (TID) | ORAL | Status: DC | PRN

## 2014-06-14 MED ORDER — CYCLOBENZAPRINE HCL 5 MG PO TABS: 5.0000 mg | ORAL_TABLET | Freq: Every evening | ORAL | Status: DC | PRN

## 2014-06-14 NOTE — Patient Instructions (Signed)
Back Pain, Adult Back pain is very common. The pain often gets better over time. The cause of back pain is usually not dangerous. Most people can learn to manage their back pain on their own.  HOME CARE   Stay active. Start with short walks on flat ground if you can. Try to walk farther each day.  Do not sit, drive, or stand in one place for more than 30 minutes. Do not stay in bed.  Do not avoid exercise or work. Activity can help your back heal faster.  Be careful when you bend or lift an object. Bend at your knees, keep the object close to you, and do not twist.  Sleep on a firm mattress. Lie on your side, and bend your knees. If you lie on your back, put a pillow under your knees.  Only take medicines as told by your doctor.  Put ice on the injured area.  Put ice in a plastic bag.  Place a towel between your skin and the bag.  Leave the ice on for 15-20 minutes, 03-04 times a day for the first 2 to 3 days. After that, you can switch between ice and heat packs.  Ask your doctor about back exercises or massage.  Avoid feeling anxious or stressed. Find good ways to deal with stress, such as exercise. GET HELP RIGHT AWAY IF:   Your pain does not go away with rest or medicine.  Your pain does not go away in 1 week.  You have new problems.  You do not feel well.  The pain spreads into your legs.  You cannot control when you poop (bowel movement) or pee (urinate).  Your arms or legs feel weak or lose feeling (numbness).  You feel sick to your stomach (nauseous) or throw up (vomit).  You have belly (abdominal) pain.  You feel like you may pass out (faint). MAKE SURE YOU:   Understand these instructions.  Will watch your condition.  Will get help right away if you are not doing well or get worse. Document Released: 04/30/2008 Document Revised: 02/04/2012 Document Reviewed: 04/02/2011 [employer] Patient Information 2015 [employer]. This information is not intended  to replace advice given to you by your health care provider. Make sure you discuss any questions you have with your health care provider.  
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
## 2014-07-07 ENCOUNTER — Encounter: Payer: Self-pay | Admitting: Internal Medicine

## 2014-07-07 ENCOUNTER — Ambulatory Visit (INDEPENDENT_AMBULATORY_CARE_PROVIDER_SITE_OTHER): Payer: BC Managed Care – PPO | Admitting: Internal Medicine

## 2014-07-07 ENCOUNTER — Other Ambulatory Visit: Payer: Self-pay | Admitting: Internal Medicine

## 2014-07-07 ENCOUNTER — Other Ambulatory Visit (INDEPENDENT_AMBULATORY_CARE_PROVIDER_SITE_OTHER): Payer: BC Managed Care – PPO

## 2014-07-07 VITALS — BP 118/78 | HR 61 | Temp 98.2°F | Wt 209.2 lb

## 2014-07-07 DIAGNOSIS — R7309 Other abnormal glucose: Secondary | ICD-10-CM

## 2014-07-07 DIAGNOSIS — I10 Essential (primary) hypertension: Secondary | ICD-10-CM

## 2014-07-07 DIAGNOSIS — Q619 Cystic kidney disease, unspecified: Secondary | ICD-10-CM

## 2014-07-07 DIAGNOSIS — F528 Other sexual dysfunction not due to a substance or known physiological condition: Secondary | ICD-10-CM

## 2014-07-07 DIAGNOSIS — Z0000 Encounter for general adult medical examination without abnormal findings: Secondary | ICD-10-CM

## 2014-07-07 DIAGNOSIS — R7302 Impaired glucose tolerance (oral): Secondary | ICD-10-CM

## 2014-07-07 DIAGNOSIS — E7800 Pure hypercholesterolemia, unspecified: Secondary | ICD-10-CM

## 2014-07-07 DIAGNOSIS — E119 Type 2 diabetes mellitus without complications: Secondary | ICD-10-CM | POA: Insufficient documentation

## 2014-07-07 DIAGNOSIS — N281 Cyst of kidney, acquired: Secondary | ICD-10-CM

## 2014-07-07 HISTORY — DX: Impaired glucose tolerance (oral): R73.02

## 2014-07-07 LAB — HEPATIC FUNCTION PANEL
ALBUMIN: 3.8 g/dL (ref 3.5–5.2)
ALK PHOS: 101 U/L (ref 39–117)
ALT: 22 U/L (ref 0–53)
AST: 24 U/L (ref 0–37)
Bilirubin, Direct: 0.1 mg/dL (ref 0.0–0.3)
Total Bilirubin: 1.4 mg/dL — ABNORMAL HIGH (ref 0.2–1.2)
Total Protein: 7 g/dL (ref 6.0–8.3)

## 2014-07-07 LAB — URINALYSIS, ROUTINE W REFLEX MICROSCOPIC
BILIRUBIN URINE: NEGATIVE
KETONES UR: NEGATIVE
LEUKOCYTES UA: NEGATIVE
NITRITE: NEGATIVE
PH: 6.5 (ref 5.0–8.0)
Specific Gravity, Urine: 1.015 (ref 1.000–1.030)
Total Protein, Urine: NEGATIVE
UROBILINOGEN UA: 1 (ref 0.0–1.0)
Urine Glucose: NEGATIVE

## 2014-07-07 LAB — BASIC METABOLIC PANEL
BUN: 12 mg/dL (ref 6–23)
CHLORIDE: 107 meq/L (ref 96–112)
CO2: 27 meq/L (ref 19–32)
Calcium: 9.8 mg/dL (ref 8.4–10.5)
Creatinine, Ser: 0.9 mg/dL (ref 0.4–1.5)
GFR: 115.99 mL/min (ref >60.00)
GLUCOSE: 108 mg/dL — AB (ref 70–99)
POTASSIUM: 5 meq/L (ref 3.5–5.1)
Sodium: 141 mEq/L (ref 135–145)

## 2014-07-07 LAB — CBC WITH DIFFERENTIAL/PLATELET
BASOS ABS: 0.4 10*3/uL — AB (ref 0.0–0.1)
Basophils Relative: 3.3 % — ABNORMAL HIGH (ref 0.0–3.0)
EOS PCT: 2.6 % (ref 0.0–5.0)
Eosinophils Absolute: 0.3 10*3/uL (ref 0.0–0.7)
HCT: 45.6 % (ref 39.0–52.0)
Hemoglobin: 15.2 g/dL (ref 13.0–17.0)
Lymphocytes Relative: 45.4 % (ref 12.0–46.0)
Lymphs Abs: 5 10*3/uL — ABNORMAL HIGH (ref 0.7–4.0)
MCHC: 33.3 g/dL (ref 30.0–36.0)
MCV: 95.9 fl (ref 78.0–100.0)
MONO ABS: 1.1 10*3/uL — AB (ref 0.1–1.0)
Monocytes Relative: 10.1 % (ref 3.0–12.0)
NEUTROS PCT: 38.6 % — AB (ref 43.0–77.0)
Neutro Abs: 4.3 10*3/uL (ref 1.4–7.7)
PLATELETS: 315 10*3/uL (ref 150.0–400.0)
RBC: 4.75 Mil/uL (ref 4.22–5.81)
RDW: 13.7 % (ref 11.5–15.5)
WBC: 11 10*3/uL — ABNORMAL HIGH (ref 4.0–10.5)

## 2014-07-07 LAB — TSH: TSH: 0.93 u[IU]/mL (ref 0.35–4.50)

## 2014-07-07 LAB — LIPID PANEL
Cholesterol: 198 mg/dL (ref 0–200)
HDL: 29.7 mg/dL — ABNORMAL LOW (ref >39.00)
LDL CALC: 133 mg/dL — AB (ref 0–99)
NonHDL: 168.3
TRIGLYCERIDES: 176 mg/dL — AB (ref 0.0–149.0)
Total CHOL/HDL Ratio: 7
VLDL: 35.2 mg/dL (ref 0.0–40.0)

## 2014-07-07 LAB — HEMOGLOBIN A1C: Hgb A1c MFr Bld: 6.9 % — ABNORMAL HIGH (ref 4.6–6.5)

## 2014-07-07 LAB — PSA: PSA: 0.79 ng/mL (ref 0.10–4.00)

## 2014-07-07 MED ORDER — ATORVASTATIN CALCIUM 10 MG PO TABS: 10.0000 mg | ORAL_TABLET | Freq: Every day | ORAL | Status: DC

## 2014-07-07 MED ORDER — SILDENAFIL CITRATE 100 MG PO TABS
50.0000 mg | ORAL_TABLET | Freq: Every day | ORAL | Status: DC | PRN
Start: 2014-07-07 — End: 2015-04-07

## 2014-07-07 MED ORDER — AMLODIPINE BESY-BENAZEPRIL HCL 5-20 MG PO CAPS: 1.0000 | ORAL_CAPSULE | Freq: Every day | ORAL | Status: DC

## 2014-07-07 NOTE — Assessment & Plan Note (Signed)
stable overall by history and exam, recent data reviewed with pt, and pt to continue medical treatment as before,  to f/u any worsening symptoms or concerns Lab Results  Component Value Date   HGBA1C 6.6* 05/07/2013   For f/u a1c

## 2014-07-07 NOTE — Assessment & Plan Note (Signed)
Mild to mod, for lower chol diet, to f/u any worsening symptoms or concerns, f/u labs today, consider statin for ldl > 100 Lab Results  Component Value Date   LDLCALC 111* 05/06/2013

## 2014-07-07 NOTE — Assessment & Plan Note (Signed)

## 2014-07-07 NOTE — Patient Instructions (Signed)

## 2014-07-07 NOTE — Assessment & Plan Note (Signed)
For refill viagra prn

## 2014-07-07 NOTE — Assessment & Plan Note (Signed)
stable overall by history and exam, recent data reviewed with pt, and pt to continue medical treatment as before,  to f/u any worsening symptoms or concerns BP Readings from Last 3 Encounters:  07/07/14 118/78  06/14/14 114/70  11/01/13 148/92

## 2014-07-07 NOTE — Progress Notes (Signed)
Subjective:    Patient ID: Benjamin Patton, male    DOB: [birth year], 63 y.o.   MRN: [id number]  HPI Here for wellness and f/u;  Overall doing ok;  Pt denies CP, worsening SOB, DOE, wheezing, orthopnea, PND, worsening LE edema, palpitations, dizziness or syncope.  Pt denies neurological change such as new headache, facial or extremity weakness.  Pt denies polydipsia, polyuria, or low sugar symptoms. Pt states overall good compliance with treatment and medications, good tolerability, and has been trying to follow lower cholesterol diet.  Pt denies worsening depressive symptoms, suicidal ideation or panic. No fever, night sweats, wt loss, loss of appetite, or other constitutional symptoms.  Pt states good ability with ADL's, has low fall risk, home safety reviewed and adequate, no other significant changes in hearing or vision, and occasionally active with exercise, with more lifting weights and gym exercise recently. Recalls had left renal cyst 2011, neg f/u at urology, not sure if he was supposed to fu. Past Medical History  Diagnosis Date  . ASTHMA 01/10/2009  . HYPERCHOLESTEROLEMIA 09/20/2007  . HYPERTENSION 09/20/2007  . COLECTOMY, HX OF 09/20/2007  . ERECTILE DYSFUNCTION 12/16/2007  . INGUINAL HERNIORRHAPHY, HX OF 09/20/2007  . NEPHROLITHIASIS, HX OF 02/13/2010  . 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 Unspecified disorder of kidney and ureter 03/22/2010  . Impaired glucose tolerance 07/07/2014   No past surgical history on file.  reports that he has never smoked. He does not have any smokeless tobacco history on file. He reports that he does not drink alcohol or use illicit drugs. family history includes Heart disease in his brother and sister; Hypertension in his father and mother. No Known Allergies Current Outpatient Prescriptions on File Prior to Visit  Medication Sig Dispense Refill  . amLODipine-benazepril (LOTREL) 5-20 MG per capsule TAKE 2 CAPSULES BY MOUTH EVERY DAY  180  capsule  0  . aspirin 81 MG EC tablet Take 1 tablet (81 mg total) by mouth daily. Swallow whole.  30 tablet  12   No current facility-administered medications on file prior to visit.   Review of Systems Constitutional: Negative for increased diaphoresis, other activity, appetite or other siginficant weight change  HENT: Negative for worsening hearing loss, ear pain, facial swelling, mouth sores and neck stiffness.   Eyes: Negative for other worsening pain, redness or visual disturbance.  Respiratory: Negative for shortness of breath and wheezing.   Cardiovascular: Negative for chest pain and palpitations.  Gastrointestinal: Negative for diarrhea, blood in stool, abdominal distention or other pain Genitourinary: Negative for hematuria, flank pain or change in urine volume.  Musculoskeletal: Negative for myalgias or other joint complaints.  Skin: Negative for color change and wound.  Neurological: Negative for syncope and numbness. other than noted Hematological: Negative for adenopathy. or other swelling Psychiatric/Behavioral: Negative for hallucinations, self-injury, decreased concentration or other worsening agitation.      Objective:   Physical Exam BP 118/78  Pulse 61  Temp(Src) 98.2 F (36.8 C) (Oral)  Wt 209 lb 4 oz (94.915 kg)  SpO2 97% VS noted,  Constitutional: Pt is oriented to person, place, and time. Appears well-developed and well-nourished.  Head: Normocephalic and atraumatic.  Right Ear: External ear normal.  Left Ear: External ear normal.  Nose: Nose normal.  Mouth/Throat: Oropharynx is clear and moist.  Eyes: Conjunctivae and EOM are normal. Pupils are equal, round, and reactive to light.  Neck: Normal range of motion. Neck supple. No JVD present. No tracheal deviation present.  Cardiovascular: Normal rate, regular rhythm, normal heart sounds and intact distal pulses.   Pulmonary/Chest: Effort normal and breath sounds without rales or wheezing  Abdominal: Soft.  Bowel sounds are normal. NT. No HSM  Musculoskeletal: Normal range of motion. Exhibits no edema.  Lymphadenopathy:  Has no cervical adenopathy.  Neurological: Pt is alert and oriented to person, place, and time. Pt has normal reflexes. No cranial nerve deficit. Motor grossly intact Skin: Skin is warm and dry. No rash noted.  Psychiatric:  Has normal mood and affect. Behavior is normal.     Assessment & Plan:

## 2014-07-07 NOTE — Progress Notes (Signed)
Pre visit review using our clinic review tool, if applicable. No additional management support is needed unless otherwise documented below in the visit note. 

## 2014-07-07 NOTE — Assessment & Plan Note (Signed)
Will ask for records from [facility], likely had ct there 2011, with some recommendation or not for f/u after

## 2014-08-12 ENCOUNTER — Encounter: Payer: Self-pay | Admitting: Internal Medicine

## 2014-08-28 ENCOUNTER — Other Ambulatory Visit: Payer: Self-pay | Admitting: Family Medicine

## 2014-08-30 NOTE — Telephone Encounter (Signed)
Dr Marin Comment, do you want to give RFs on this or have pt RTC?

## 2014-10-06 ENCOUNTER — Ambulatory Visit (INDEPENDENT_AMBULATORY_CARE_PROVIDER_SITE_OTHER): Payer: BC Managed Care – PPO | Admitting: Internal Medicine

## 2014-10-06 ENCOUNTER — Telehealth: Payer: Self-pay | Admitting: Internal Medicine

## 2014-10-06 ENCOUNTER — Ambulatory Visit (INDEPENDENT_AMBULATORY_CARE_PROVIDER_SITE_OTHER)
Admission: RE | Admit: 2014-10-06 | Discharge: 2014-10-06 | Disposition: A | Discharge status: Home / Self Care | Payer: BC Managed Care – PPO | Source: Ambulatory Visit | Attending: Internal Medicine | Admitting: Internal Medicine

## 2014-10-06 ENCOUNTER — Encounter: Payer: Self-pay | Admitting: Internal Medicine

## 2014-10-06 VITALS — BP 110/78 | HR 67 | Temp 98.5°F | Resp 14 | Wt 207.1 lb

## 2014-10-06 DIAGNOSIS — R0781 Pleurodynia: Secondary | ICD-10-CM

## 2014-10-06 DIAGNOSIS — Z23 Encounter for immunization: Secondary | ICD-10-CM

## 2014-10-06 DIAGNOSIS — R9431 Abnormal electrocardiogram [ECG] [EKG]: Secondary | ICD-10-CM

## 2014-10-06 DIAGNOSIS — Z9081 Acquired absence of spleen: Secondary | ICD-10-CM

## 2014-10-06 DIAGNOSIS — R079 Chest pain, unspecified: Secondary | ICD-10-CM

## 2014-10-06 NOTE — Progress Notes (Signed)
Pre visit review using our clinic review tool, if applicable. No additional management support is needed unless otherwise documented below in the visit note. 

## 2014-10-06 NOTE — Telephone Encounter (Signed)
Patient Information:  Caller Name: [facility]  Phone: [phone]  Patient: Benjamin Patton, Benjamin Patton  Gender: Male  DOB: [birth year]  Age: 63 Years  PCP: Cathlean Cower (Adults only)  Office Follow Up:  Does the office need to follow up with this patient?: No  Instructions For The Office: N/A   Symptoms  Reason For Call & Symptoms: Patient reports cramping left chest pain, worsened with deep breaths.  Pain rated at 3-4 of 10; intermittent; cramps may last all day; pain only with deep breaths.  He feels that this started afeter BP med reduced from BID to once daily.  (Last medication change 07/07/14.)  Emergent symptoms ruled out.  See Today in Office per Chest Pain guideline due to Intermittent chest pains persisit > 3 days.  Reviewed Health History In EMR: Yes  Reviewed Medications In EMR: Yes  Reviewed Allergies In EMR: Yes  Reviewed Surgeries / Procedures: Yes  Date of Onset of Symptoms: 09/22/2014  Guideline(s) Used:  Chest Pain  Disposition Per Guideline:   See Today in Office  Reason For Disposition Reached:   Intermittent chest pains persist > 3 days  Advice Given:  Fleeting Chest Pain:  Fleeting chest pains that last only a few seconds and then go away are generally not serious. They may be from pinched muscles or nerves in your chest wall.  Chest Pain Only When Coughing:  Chest pains that occur with coughing generally come from the chest wall and from irritation of the airways. They are usually not serious.  Expected Course:  These mild chest pains usually disappear within 3 days.  Call Back If:  Severe chest pain  Difficulty breathing  Fever  You become worse.  Patient Will Follow Care Advice:  YES  Appointment Scheduled:  10/06/2014 13:15:00 Appointment Scheduled Provider:  Unice Cobble
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
## 2014-10-06 NOTE — Patient Instructions (Addendum)
The Cardiology referral will be scheduled and you'll be notified of the time.Please call the Referral Co-Ordinator @ [phone] if you have not been notified of appointment time within 7-10 days. Take Aleve one to 2 every 8-12 hours with food as needed for the pleuritic pain.
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
## 2014-10-14 ENCOUNTER — Encounter: Payer: BC Managed Care – PPO | Admitting: Cardiology

## 2014-11-08 ENCOUNTER — Encounter: Payer: Self-pay | Admitting: Cardiology

## 2014-11-11 ENCOUNTER — Ambulatory Visit: Payer: BC Managed Care – PPO | Admitting: Cardiology

## 2014-12-01 ENCOUNTER — Ambulatory Visit: Payer: BC Managed Care – PPO | Admitting: Cardiology

## 2015-03-28 ENCOUNTER — Ambulatory Visit: Payer: Self-pay | Admitting: Internal Medicine

## 2015-04-07 ENCOUNTER — Encounter: Payer: Self-pay | Admitting: Cardiovascular Disease

## 2015-04-07 ENCOUNTER — Ambulatory Visit (INDEPENDENT_AMBULATORY_CARE_PROVIDER_SITE_OTHER): Payer: BLUE CROSS/BLUE SHIELD | Admitting: Cardiovascular Disease

## 2015-04-07 VITALS — BP 132/90 | HR 68 | Ht 70.0 in | Wt 208.6 lb

## 2015-04-07 DIAGNOSIS — R079 Chest pain, unspecified: Secondary | ICD-10-CM | POA: Diagnosis not present

## 2015-04-07 DIAGNOSIS — I10 Essential (primary) hypertension: Secondary | ICD-10-CM

## 2015-04-07 DIAGNOSIS — Z79899 Other long term (current) drug therapy: Secondary | ICD-10-CM

## 2015-04-07 DIAGNOSIS — R0789 Other chest pain: Secondary | ICD-10-CM

## 2015-04-07 DIAGNOSIS — R011 Cardiac murmur, unspecified: Secondary | ICD-10-CM

## 2015-04-07 DIAGNOSIS — R9431 Abnormal electrocardiogram [ECG] [EKG]: Secondary | ICD-10-CM | POA: Diagnosis not present

## 2015-04-07 DIAGNOSIS — E785 Hyperlipidemia, unspecified: Secondary | ICD-10-CM

## 2015-04-07 LAB — LIPID PANEL
CHOL/HDL RATIO: 5.7 ratio
CHOLESTEROL: 193 mg/dL (ref 0–200)
HDL: 34 mg/dL — ABNORMAL LOW (ref >=40)
LDL CALC: 136 mg/dL — AB (ref 0–99)
TRIGLYCERIDES: 116 mg/dL (ref <150)
VLDL: 23 mg/dL (ref 0–40)

## 2015-04-07 LAB — COMPREHENSIVE METABOLIC PANEL
ALBUMIN: 4.2 g/dL (ref 3.5–5.2)
ALK PHOS: 127 U/L — AB (ref 39–117)
ALT: 21 U/L (ref 0–53)
AST: 19 U/L (ref 0–37)
BILIRUBIN TOTAL: 0.7 mg/dL (ref 0.2–1.2)
BUN: 11 mg/dL (ref 6–23)
CALCIUM: 9.8 mg/dL (ref 8.4–10.5)
CO2: 28 mEq/L (ref 19–32)
Chloride: 104 mEq/L (ref 96–112)
Creat: 0.94 mg/dL (ref 0.50–1.35)
Glucose, Bld: 121 mg/dL — ABNORMAL HIGH (ref 70–99)
Potassium: 4.6 mEq/L (ref 3.5–5.3)
SODIUM: 141 meq/L (ref 135–145)
Total Protein: 7.7 g/dL (ref 6.0–8.3)

## 2015-04-07 LAB — CBC
HCT: 46.1 % (ref 39.0–52.0)
Hemoglobin: 15.5 g/dL (ref 13.0–17.0)
MCH: 31.3 pg (ref 26.0–34.0)
MCHC: 33.6 g/dL (ref 30.0–36.0)
MCV: 92.9 fL (ref 78.0–100.0)
MPV: 9.7 fL (ref 8.6–12.4)
PLATELETS: 395 10*3/uL (ref 150–400)
RBC: 4.96 MIL/uL (ref 4.22–5.81)
RDW: 14.1 % (ref 11.5–15.5)
WBC: 11.5 10*3/uL — AB (ref 4.0–10.5)

## 2015-04-07 LAB — TSH: TSH: 1.314 u[IU]/mL (ref 0.350–4.500)

## 2015-04-07 NOTE — Patient Instructions (Signed)
Your physician recommends that you schedule a follow-up appointment in: As Needed unless Echo come back Abnormal  Your physician recommends that you return for lab work CBC, CMP, TSH, FASTING LIPIDS  Your physician has requested that you have an echocardiogram. Echocardiography is a painless test that uses sound waves to create images of your heart. It provides your doctor with information about the size and shape of your heart and how well your heart's chambers and valves are working. This procedure takes approximately one hour. There are no restrictions for this procedure.

## 2015-04-08 ENCOUNTER — Encounter: Payer: Self-pay | Admitting: Cardiovascular Disease

## 2015-04-08 DIAGNOSIS — R079 Chest pain, unspecified: Secondary | ICD-10-CM | POA: Insufficient documentation

## 2015-04-08 DIAGNOSIS — R0789 Other chest pain: Secondary | ICD-10-CM | POA: Insufficient documentation

## 2015-04-08 DIAGNOSIS — E785 Hyperlipidemia, unspecified: Secondary | ICD-10-CM | POA: Insufficient documentation
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
## 2015-04-13 ENCOUNTER — Ambulatory Visit (HOSPITAL_COMMUNITY)
Admission: RE | Admit: 2015-04-13 | Discharge: 2015-04-13 | Disposition: A | Discharge status: Home / Self Care | Payer: BLUE CROSS/BLUE SHIELD | Source: Ambulatory Visit | Attending: Cardiovascular Disease | Admitting: Cardiovascular Disease

## 2015-04-13 DIAGNOSIS — R011 Cardiac murmur, unspecified: Secondary | ICD-10-CM

## 2015-04-13 DIAGNOSIS — I10 Essential (primary) hypertension: Secondary | ICD-10-CM

## 2015-04-13 DIAGNOSIS — R9431 Abnormal electrocardiogram [ECG] [EKG]: Secondary | ICD-10-CM | POA: Diagnosis not present

## 2015-04-13 DIAGNOSIS — E785 Hyperlipidemia, unspecified: Secondary | ICD-10-CM | POA: Insufficient documentation

## 2015-04-13 DIAGNOSIS — J45909 Unspecified asthma, uncomplicated: Secondary | ICD-10-CM | POA: Insufficient documentation

## 2015-04-13 DIAGNOSIS — R079 Chest pain, unspecified: Secondary | ICD-10-CM

## 2015-04-21 ENCOUNTER — Encounter: Payer: Self-pay | Admitting: *Deleted

## 2015-04-29 ENCOUNTER — Other Ambulatory Visit: Payer: Self-pay | Admitting: *Deleted

## 2015-04-29 ENCOUNTER — Telehealth: Payer: Self-pay | Admitting: *Deleted

## 2015-04-29 DIAGNOSIS — Z79899 Other long term (current) drug therapy: Secondary | ICD-10-CM

## 2015-04-29 DIAGNOSIS — E7889 Other lipoprotein metabolism disorders: Secondary | ICD-10-CM

## 2015-04-29 DIAGNOSIS — Z0000 Encounter for general adult medical examination without abnormal findings: Secondary | ICD-10-CM

## 2015-04-29 MED ORDER — ATORVASTATIN CALCIUM 20 MG PO TABS: 20.0000 mg | ORAL_TABLET | Freq: Every day | ORAL | Status: DC

## 2015-04-29 NOTE — Telephone Encounter (Signed)
-----   Message from Troy Sine, MD sent at 04/17/2015 10:11 PM EDT ----- Add atorvastatin 20 mg

## 2015-04-29 NOTE — Telephone Encounter (Signed)
Informed patient of lab results and recommendations. He asked for the numbers. These were provdied to the patient. He has agreed to try the atorvastatin. Prescription sent to [place]  Will be mailed to patient to recheck cholesterol in 8 -12 weeks.

## 2015-06-02 ENCOUNTER — Other Ambulatory Visit: Payer: Self-pay | Admitting: Internal Medicine

## 2015-08-09 ENCOUNTER — Other Ambulatory Visit: Payer: Self-pay | Admitting: Internal Medicine

## 2015-09-01 ENCOUNTER — Other Ambulatory Visit: Payer: Self-pay | Admitting: Internal Medicine

## 2015-10-02 ENCOUNTER — Other Ambulatory Visit: Payer: Self-pay | Admitting: Internal Medicine

## 2015-12-09 ENCOUNTER — Ambulatory Visit (INDEPENDENT_AMBULATORY_CARE_PROVIDER_SITE_OTHER): Payer: BLUE CROSS/BLUE SHIELD | Admitting: Internal Medicine

## 2015-12-09 ENCOUNTER — Encounter: Payer: Self-pay | Admitting: Internal Medicine

## 2015-12-09 ENCOUNTER — Other Ambulatory Visit (INDEPENDENT_AMBULATORY_CARE_PROVIDER_SITE_OTHER): Payer: BLUE CROSS/BLUE SHIELD

## 2015-12-09 VITALS — BP 130/74 | HR 86 | Temp 98.1°F | Ht 70.0 in | Wt 207.0 lb

## 2015-12-09 DIAGNOSIS — I10 Essential (primary) hypertension: Secondary | ICD-10-CM

## 2015-12-09 DIAGNOSIS — Z0000 Encounter for general adult medical examination without abnormal findings: Secondary | ICD-10-CM | POA: Diagnosis not present

## 2015-12-09 DIAGNOSIS — R7302 Impaired glucose tolerance (oral): Secondary | ICD-10-CM

## 2015-12-09 DIAGNOSIS — E7800 Pure hypercholesterolemia, unspecified: Secondary | ICD-10-CM

## 2015-12-09 LAB — BASIC METABOLIC PANEL
BUN: 16 mg/dL (ref 6–23)
CALCIUM: 9.4 mg/dL (ref 8.4–10.5)
CHLORIDE: 104 meq/L (ref 96–112)
CO2: 28 meq/L (ref 19–32)
CREATININE: 0.99 mg/dL (ref 0.40–1.50)
GFR: 98.14 mL/min (ref >60.00)
GLUCOSE: 108 mg/dL — AB (ref 70–99)
Potassium: 3.9 mEq/L (ref 3.5–5.1)
Sodium: 140 mEq/L (ref 135–145)

## 2015-12-09 LAB — URINALYSIS, ROUTINE W REFLEX MICROSCOPIC
Bilirubin Urine: NEGATIVE
KETONES UR: NEGATIVE
LEUKOCYTES UA: NEGATIVE
NITRITE: NEGATIVE
Specific Gravity, Urine: 1.02 (ref 1.000–1.030)
Total Protein, Urine: NEGATIVE
URINE GLUCOSE: NEGATIVE
Urobilinogen, UA: 2 — AB (ref 0.0–1.0)
pH: 6 (ref 5.0–8.0)

## 2015-12-09 LAB — CBC WITH DIFFERENTIAL/PLATELET
Basophils Absolute: 0.1 10*3/uL (ref 0.0–0.1)
Basophils Relative: 0.5 % (ref 0.0–3.0)
EOS ABS: 0.3 10*3/uL (ref 0.0–0.7)
EOS PCT: 2.2 % (ref 0.0–5.0)
HCT: 45.3 % (ref 39.0–52.0)
HEMOGLOBIN: 14.9 g/dL (ref 13.0–17.0)
Lymphocytes Relative: 27.6 % (ref 12.0–46.0)
Lymphs Abs: 3.4 10*3/uL (ref 0.7–4.0)
MCHC: 32.8 g/dL (ref 30.0–36.0)
MCV: 94.1 fl (ref 78.0–100.0)
MONO ABS: 1.2 10*3/uL — AB (ref 0.1–1.0)
Monocytes Relative: 9.6 % (ref 3.0–12.0)
Neutro Abs: 7.4 10*3/uL (ref 1.4–7.7)
Neutrophils Relative %: 60.1 % (ref 43.0–77.0)
Platelets: 325 10*3/uL (ref 150.0–400.0)
RBC: 4.82 Mil/uL (ref 4.22–5.81)
RDW: 13.5 % (ref 11.5–15.5)
WBC: 12.2 10*3/uL — AB (ref 4.0–10.5)

## 2015-12-09 LAB — LIPID PANEL
CHOLESTEROL: 204 mg/dL — AB (ref 0–200)
HDL: 33.4 mg/dL — AB (ref >39.00)
LDL Cholesterol: 148 mg/dL — ABNORMAL HIGH (ref 0–99)
NonHDL: 170.55
TRIGLYCERIDES: 111 mg/dL (ref 0.0–149.0)
Total CHOL/HDL Ratio: 6
VLDL: 22.2 mg/dL (ref 0.0–40.0)

## 2015-12-09 LAB — HEPATIC FUNCTION PANEL
ALBUMIN: 4.1 g/dL (ref 3.5–5.2)
ALT: 21 U/L (ref 0–53)
AST: 24 U/L (ref 0–37)
Alkaline Phosphatase: 117 U/L (ref 39–117)
Bilirubin, Direct: 0.1 mg/dL (ref 0.0–0.3)
TOTAL PROTEIN: 7.9 g/dL (ref 6.0–8.3)
Total Bilirubin: 0.8 mg/dL (ref 0.2–1.2)

## 2015-12-09 LAB — HEMOGLOBIN A1C: HEMOGLOBIN A1C: 7.9 % — AB (ref 4.6–6.5)

## 2015-12-09 LAB — TSH: TSH: 0.98 u[IU]/mL (ref 0.35–4.50)

## 2015-12-09 MED ORDER — ATORVASTATIN CALCIUM 20 MG PO TABS: 20.0000 mg | ORAL_TABLET | Freq: Every day | ORAL | Status: DC

## 2015-12-09 MED ORDER — AMLODIPINE BESY-BENAZEPRIL HCL 5-20 MG PO CAPS: 1.0000 | ORAL_CAPSULE | Freq: Every day | ORAL | Status: DC

## 2015-12-09 NOTE — Progress Notes (Signed)
Pre visit review using our clinic review tool, if applicable. No additional management support is needed unless otherwise documented below in the visit note. 
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
## 2015-12-09 NOTE — Patient Instructions (Addendum)
Please continue all other medications as before, and refills have been done if requested.  Please have the pharmacy call with any other refills you may need.  Please continue your efforts at being more active, low cholesterol diet, and weight control.  You are otherwise up to date with prevention measures today.  Please keep your appointments with your specialists as you may have planned  You will be contacted regarding the referral for: colonoscopy  Please go to the LAB in the Basement (turn left off the elevator) for the tests to be done today  You will be contacted by phone if any changes need to be made immediately.  Otherwise, you will receive a letter about your results with an explanation, but please check with MyChart first.  Please remember to sign up for MyChart if you have not done so, as this will be important to you in the future with finding out test results, communicating by private email, and scheduling acute appointments online when needed.  Please return in 6 months, or sooner if needed, with Lab testing done 3-5 days before  

## 2015-12-10 ENCOUNTER — Other Ambulatory Visit: Payer: Self-pay | Admitting: Internal Medicine

## 2015-12-10 LAB — HEPATITIS C ANTIBODY: HCV AB: NEGATIVE

## 2015-12-10 MED ORDER — METFORMIN HCL ER 500 MG PO TB24: 500.0000 mg | ORAL_TABLET | Freq: Every day | ORAL | Status: DC

## 2015-12-10 NOTE — Assessment & Plan Note (Signed)
stable overall by history and exam, recent data reviewed with pt, and pt to continue medical treatment as before,  to f/u any worsening symptoms or concerns BP Readings from Last 3 Encounters:  12/09/15 130/74  04/07/15 132/90  10/06/14 110/78

## 2015-12-10 NOTE — Assessment & Plan Note (Signed)
stable overall by history and exam,  and pt to continue medical treatment as before, consider statin, to f/u any worsening symptoms or concerns

## 2015-12-10 NOTE — Assessment & Plan Note (Signed)
stable overall by history and exam, recent data reviewed with pt, and pt to continue medical treatment as before,  to f/u any worsening symptoms or concerns Lab Results  Component Value Date   HGBA1C 7.9* 12/09/2015

## 2015-12-10 NOTE — Assessment & Plan Note (Signed)

## 2016-01-12 ENCOUNTER — Encounter: Payer: Self-pay | Admitting: Internal Medicine

## 2016-11-22 ENCOUNTER — Encounter: Payer: Self-pay | Admitting: Family Medicine

## 2016-11-22 ENCOUNTER — Ambulatory Visit (INDEPENDENT_AMBULATORY_CARE_PROVIDER_SITE_OTHER): Payer: BLUE CROSS/BLUE SHIELD | Admitting: Family Medicine

## 2016-11-22 VITALS — BP 134/76 | HR 77 | Temp 98.3°F | Ht 70.0 in | Wt 199.0 lb

## 2016-11-22 DIAGNOSIS — J069 Acute upper respiratory infection, unspecified: Secondary | ICD-10-CM | POA: Diagnosis not present

## 2016-11-22 DIAGNOSIS — R05 Cough: Secondary | ICD-10-CM | POA: Diagnosis not present

## 2016-11-22 DIAGNOSIS — R059 Cough, unspecified: Secondary | ICD-10-CM

## 2016-11-22 DIAGNOSIS — Z9109 Other allergy status, other than to drugs and biological substances: Secondary | ICD-10-CM

## 2016-11-22 MED ORDER — BENZONATATE 100 MG PO CAPS: 100.0000 mg | ORAL_CAPSULE | Freq: Two times a day (BID) | ORAL | 0 refills | Status: DC | PRN

## 2016-11-22 MED ORDER — AZITHROMYCIN 250 MG PO TABS: ORAL_TABLET | ORAL | 0 refills | Status: DC

## 2016-11-22 MED ORDER — FLUTICASONE PROPIONATE 50 MCG/ACT NA SUSP: 2.0000 | Freq: Every day | NASAL | 6 refills | Status: DC

## 2016-11-22 NOTE — Assessment & Plan Note (Signed)
Pt was inaccurately listed as having asthma.  He does not have history of asthma. His diagnosis is bronchospasm due to environmental allergies particularly dust.
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
## 2016-11-22 NOTE — Patient Instructions (Addendum)
   IF you received an x-ray today, you will receive an invoice from [facility]. Please contact [facility] at [phone] with questions or concerns regarding your invoice.   IF you received labwork today, you will receive an invoice from [facility]. Please contact [facility] at [phone] with questions or concerns regarding your invoice.   Our billing staff will not be able to assist you with questions regarding bills from these companies.  You will be contacted with the lab results as soon as they are available. The fastest way to get your results is to activate your My Chart account. Instructions are located on the last page of this paperwork. If you have not heard from us regarding the results in 2 weeks, please contact this office.      Upper Respiratory Infection, Adult Most upper respiratory infections (URIs) are a viral infection of the air passages leading to the lungs. A URI affects the nose, throat, and upper air passages. The most common type of URI is nasopharyngitis and is typically referred to as "the common cold." URIs run their course and usually go away on their own. Most of the time, a URI does not require medical attention, but sometimes a bacterial infection in the upper airways can follow a viral infection. This is called a secondary infection. Sinus and middle ear infections are common types of secondary upper respiratory infections. Bacterial pneumonia can also complicate a URI. A URI can worsen asthma and chronic obstructive pulmonary disease (COPD). Sometimes, these complications can require emergency medical care and may be life threatening. What are the causes? Almost all URIs are caused by viruses. A virus is a type of germ and can spread from one person to another. What increases the risk? You may be at risk for a URI if:  You smoke.  You have chronic heart or lung disease.  You have a weakened defense (immune) system.  You are very  young or very old.  You have nasal allergies or asthma.  You work in crowded or poorly ventilated areas.  You work in health care facilities or schools. What are the signs or symptoms? Symptoms typically develop 2-3 days after you come in contact with a cold virus. Most viral URIs last 7-10 days. However, viral URIs from the influenza virus (flu virus) can last 14-18 days and are typically more severe. Symptoms may include:  Runny or stuffy (congested) nose.  Sneezing.  Cough.  Sore throat.  Headache.  Fatigue.  Fever.  Loss of appetite.  Pain in your forehead, behind your eyes, and over your cheekbones (sinus pain).  Muscle aches. How is this diagnosed? Your health care provider may diagnose a URI by:  Physical exam.  Tests to check that your symptoms are not due to another condition such as:  Strep throat.  Sinusitis.  Pneumonia.  Asthma. How is this treated? A URI goes away on its own with time. It cannot be cured with medicines, but medicines may be prescribed or recommended to relieve symptoms. Medicines may help:  Reduce your fever.  Reduce your cough.  Relieve nasal congestion. Follow these instructions at home:  Take medicines only as directed by your health care provider.  Gargle warm saltwater or take cough drops to comfort your throat as directed by your health care provider.  Use a warm mist humidifier or inhale steam from a shower to increase air moisture. This may make it easier to breathe.  Drink enough fluid to keep your urine clear   or pale yellow.  Eat soups and other clear broths and maintain good nutrition.  Rest as needed.  Return to work when your temperature has returned to normal or as your health care provider advises. You may need to stay home longer to avoid infecting others. You can also use a face mask and careful hand washing to prevent spread of the virus.  Increase the usage of your inhaler if you have asthma.  Do not  use any tobacco products, including cigarettes, chewing tobacco, or electronic cigarettes. If you need help quitting, ask your health care provider. How is this prevented? The best way to protect yourself from getting a cold is to practice good hygiene.  Avoid oral or hand contact with people with cold symptoms.  Wash your hands often if contact occurs. There is no clear evidence that vitamin C, vitamin E, echinacea, or exercise reduces the chance of developing a cold. However, it is always recommended to get plenty of rest, exercise, and practice good nutrition. Contact a health care provider if:  You are getting worse rather than better.  Your symptoms are not controlled by medicine.  You have chills.  You have worsening shortness of breath.  You have brown or red mucus.  You have yellow or brown nasal discharge.  You have pain in your face, especially when you bend forward.  You have a fever.  You have swollen neck glands.  You have pain while swallowing.  You have white areas in the back of your throat. Get help right away if:  You have severe or persistent:  Headache.  Ear pain.  Sinus pain.  Chest pain.  You have chronic lung disease and any of the following:  Wheezing.  Prolonged cough.  Coughing up blood.  A change in your usual mucus.  You have a stiff neck.  You have changes in your:  Vision.  Hearing.  Thinking.  Mood. This information is not intended to replace advice given to you by your health care provider. Make sure you discuss any questions you have with your health care provider. Document Released: 05/08/2001 Document Revised: 07/15/2016 Document Reviewed: 02/17/2014 Elsevier Interactive Patient Education  2017 [employer]  

## 2016-12-10 ENCOUNTER — Other Ambulatory Visit: Payer: Self-pay | Admitting: Internal Medicine

## 2018-01-13 ENCOUNTER — Other Ambulatory Visit: Payer: Self-pay | Admitting: Internal Medicine

## 2018-06-20 ENCOUNTER — Other Ambulatory Visit: Payer: Self-pay | Admitting: Internal Medicine

## 2018-06-30 ENCOUNTER — Other Ambulatory Visit: Payer: Self-pay | Admitting: Internal Medicine

## 2018-06-30 ENCOUNTER — Other Ambulatory Visit (INDEPENDENT_AMBULATORY_CARE_PROVIDER_SITE_OTHER): Payer: BLUE CROSS/BLUE SHIELD

## 2018-06-30 ENCOUNTER — Encounter: Payer: Self-pay | Admitting: Internal Medicine

## 2018-06-30 ENCOUNTER — Ambulatory Visit: Payer: BLUE CROSS/BLUE SHIELD | Admitting: Internal Medicine

## 2018-06-30 VITALS — BP 136/88 | HR 91 | Temp 98.4°F | Ht 70.0 in | Wt 206.0 lb

## 2018-06-30 DIAGNOSIS — Z9081 Acquired absence of spleen: Secondary | ICD-10-CM | POA: Diagnosis not present

## 2018-06-30 DIAGNOSIS — Z23 Encounter for immunization: Secondary | ICD-10-CM | POA: Diagnosis not present

## 2018-06-30 DIAGNOSIS — M1712 Unilateral primary osteoarthritis, left knee: Secondary | ICD-10-CM | POA: Insufficient documentation

## 2018-06-30 DIAGNOSIS — Z0000 Encounter for general adult medical examination without abnormal findings: Secondary | ICD-10-CM | POA: Diagnosis not present

## 2018-06-30 DIAGNOSIS — Z114 Encounter for screening for human immunodeficiency virus [HIV]: Secondary | ICD-10-CM | POA: Diagnosis not present

## 2018-06-30 DIAGNOSIS — R7302 Impaired glucose tolerance (oral): Secondary | ICD-10-CM

## 2018-06-30 DIAGNOSIS — R3129 Other microscopic hematuria: Secondary | ICD-10-CM

## 2018-06-30 DIAGNOSIS — I10 Essential (primary) hypertension: Secondary | ICD-10-CM

## 2018-06-30 LAB — BASIC METABOLIC PANEL
BUN: 10 mg/dL (ref 6–23)
CO2: 31 meq/L (ref 19–32)
CREATININE: 0.94 mg/dL (ref 0.40–1.50)
Calcium: 9.8 mg/dL (ref 8.4–10.5)
Chloride: 105 mEq/L (ref 96–112)
GFR: 103.36 mL/min (ref >60.00)
Glucose, Bld: 127 mg/dL — ABNORMAL HIGH (ref 70–99)
Potassium: 4.1 mEq/L (ref 3.5–5.1)
Sodium: 142 mEq/L (ref 135–145)

## 2018-06-30 LAB — PSA: PSA: 1.57 ng/mL (ref 0.10–4.00)

## 2018-06-30 LAB — URINALYSIS, ROUTINE W REFLEX MICROSCOPIC
Bilirubin Urine: NEGATIVE
Ketones, ur: NEGATIVE
Leukocytes, UA: NEGATIVE
Nitrite: NEGATIVE
Specific Gravity, Urine: 1.02 (ref 1.000–1.030)
Total Protein, Urine: NEGATIVE
Urine Glucose: NEGATIVE
Urobilinogen, UA: 1 (ref 0.0–1.0)
pH: 6 (ref 5.0–8.0)

## 2018-06-30 LAB — CBC WITH DIFFERENTIAL/PLATELET
BASOS ABS: 0.1 10*3/uL (ref 0.0–0.1)
Basophils Relative: 0.8 % (ref 0.0–3.0)
Eosinophils Absolute: 0.2 10*3/uL (ref 0.0–0.7)
Eosinophils Relative: 2.2 % (ref 0.0–5.0)
HEMATOCRIT: 41.6 % (ref 39.0–52.0)
Hemoglobin: 14.2 g/dL (ref 13.0–17.0)
LYMPHS ABS: 3.3 10*3/uL (ref 0.7–4.0)
LYMPHS PCT: 38.8 % (ref 12.0–46.0)
MCHC: 34 g/dL (ref 30.0–36.0)
MCV: 94.2 fl (ref 78.0–100.0)
MONOS PCT: 8.7 % (ref 3.0–12.0)
Monocytes Absolute: 0.7 10*3/uL (ref 0.1–1.0)
NEUTROS PCT: 49.5 % (ref 43.0–77.0)
Neutro Abs: 4.2 10*3/uL (ref 1.4–7.7)
Platelets: 322 10*3/uL (ref 150.0–400.0)
RBC: 4.42 Mil/uL (ref 4.22–5.81)
RDW: 14 % (ref 11.5–15.5)
WBC: 8.5 10*3/uL (ref 4.0–10.5)

## 2018-06-30 LAB — HEMOGLOBIN A1C: Hgb A1c MFr Bld: 7.3 % — ABNORMAL HIGH (ref 4.6–6.5)

## 2018-06-30 LAB — HEPATIC FUNCTION PANEL
ALBUMIN: 4 g/dL (ref 3.5–5.2)
ALK PHOS: 104 U/L (ref 39–117)
ALT: 13 U/L (ref 0–53)
AST: 16 U/L (ref 0–37)
BILIRUBIN DIRECT: 0.1 mg/dL (ref 0.0–0.3)
Total Bilirubin: 0.7 mg/dL (ref 0.2–1.2)
Total Protein: 7.1 g/dL (ref 6.0–8.3)

## 2018-06-30 LAB — LIPID PANEL
CHOL/HDL RATIO: 6
CHOLESTEROL: 181 mg/dL (ref 0–200)
HDL: 29.6 mg/dL — AB (ref >39.00)
LDL CALC: 122 mg/dL — AB (ref 0–99)
NonHDL: 151.43
TRIGLYCERIDES: 147 mg/dL (ref 0.0–149.0)
VLDL: 29.4 mg/dL (ref 0.0–40.0)

## 2018-06-30 LAB — TSH: TSH: 1.75 u[IU]/mL (ref 0.35–4.50)

## 2018-06-30 MED ORDER — AMLODIPINE BESY-BENAZEPRIL HCL 5-20 MG PO CAPS: 1.0000 | ORAL_CAPSULE | Freq: Every day | ORAL | 3 refills | Status: DC

## 2018-06-30 MED ORDER — METFORMIN HCL ER 500 MG PO TB24: 1000.0000 mg | ORAL_TABLET | Freq: Every day | ORAL | 3 refills | Status: DC

## 2018-06-30 NOTE — Assessment & Plan Note (Signed)
stable overall by history and exam, recent data reviewed with pt, and pt to continue medical treatment as before,  to f/u any worsening symptoms or concerns Lab Results  Component Value Date   HGBA1C 7.3 (H) 06/30/2018
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
## 2018-06-30 NOTE — Assessment & Plan Note (Signed)
For f/u sports medicine

## 2018-06-30 NOTE — Assessment & Plan Note (Signed)
stable overall by history and exam, recent data reviewed with pt, and pt to continue medical treatment as before,  to f/u any worsening symptoms or concerns, for med refill 

## 2018-06-30 NOTE — Patient Instructions (Addendum)
You had the Pneumovax pneumonia shot today, and the Meningococcal today  You will be contacted regarding the referral for: colonoscopy  Please continue all other medications as before, and refills have been done if requested.  Please have the pharmacy call with any other refills you may need.  Please continue your efforts at being more active, low cholesterol diet, and weight control.  You are otherwise up to date with prevention measures today.  Please keep your appointments with your specialists as you may have planned  Please go to the LAB in the Basement (turn left off the elevator) for the tests to be done today  You will be contacted by phone if any changes need to be made immediately.  Otherwise, you will receive a letter about your results with an explanation, but please check with MyChart first.  Please remember to sign up for MyChart if you have not done so, as this will be important to you in the future with finding out test results, communicating by private email, and scheduling acute appointments online when needed.  Please return in 6 months, or sooner if needed, with Lab testing done 3-5 days before

## 2018-06-30 NOTE — Assessment & Plan Note (Signed)

## 2018-06-30 NOTE — Assessment & Plan Note (Signed)
For meningoccal vacc today

## 2018-07-01 LAB — HIV ANTIBODY (ROUTINE TESTING W REFLEX): HIV 1&2 Ab, 4th Generation: NONREACTIVE

## 2018-07-21 ENCOUNTER — Encounter: Payer: Self-pay | Admitting: Internal Medicine

## 2018-08-27 ENCOUNTER — Ambulatory Visit (AMBULATORY_SURGERY_CENTER): Payer: Self-pay | Admitting: *Deleted

## 2018-08-27 ENCOUNTER — Encounter: Payer: Self-pay | Admitting: Internal Medicine

## 2018-08-27 VITALS — Ht 70.5 in | Wt 204.0 lb

## 2018-08-27 DIAGNOSIS — Z1211 Encounter for screening for malignant neoplasm of colon: Secondary | ICD-10-CM

## 2018-08-27 MED ORDER — NA SULFATE-K SULFATE-MG SULF 17.5-3.13-1.6 GM/177ML PO SOLN: 1.0000 | Freq: Once | ORAL | 0 refills | Status: AC

## 2018-08-27 NOTE — Progress Notes (Signed)
No egg or soy allergy known to patient  No issues with past sedation with any surgeries  or procedures, no intubation problems  No diet pills per patient No home 02 use per patient  No blood thinners per patient  Pt states  issues with constipation - he will go 2-3 days with out having a BM- occ has a hard stool  No A fib or A flutter  EMMI video sent to pt's e mail - pt declined

## 2018-08-29 DIAGNOSIS — Z87442 Personal history of urinary calculi: Secondary | ICD-10-CM | POA: Diagnosis not present

## 2018-08-29 DIAGNOSIS — R3129 Other microscopic hematuria: Secondary | ICD-10-CM | POA: Diagnosis not present

## 2018-08-29 DIAGNOSIS — N281 Cyst of kidney, acquired: Secondary | ICD-10-CM | POA: Diagnosis not present

## 2018-09-10 ENCOUNTER — Ambulatory Visit (AMBULATORY_SURGERY_CENTER): Payer: BLUE CROSS/BLUE SHIELD | Admitting: Internal Medicine

## 2018-09-10 ENCOUNTER — Encounter: Payer: Self-pay | Admitting: Internal Medicine

## 2018-09-10 VITALS — BP 145/89 | HR 49 | Temp 97.1°F | Resp 11 | Ht 70.0 in | Wt 206.0 lb

## 2018-09-10 DIAGNOSIS — D120 Benign neoplasm of cecum: Secondary | ICD-10-CM | POA: Diagnosis not present

## 2018-09-10 DIAGNOSIS — D123 Benign neoplasm of transverse colon: Secondary | ICD-10-CM | POA: Diagnosis not present

## 2018-09-10 DIAGNOSIS — Z1211 Encounter for screening for malignant neoplasm of colon: Secondary | ICD-10-CM | POA: Diagnosis not present

## 2018-09-10 DIAGNOSIS — D122 Benign neoplasm of ascending colon: Secondary | ICD-10-CM

## 2018-09-10 DIAGNOSIS — K6389 Other specified diseases of intestine: Secondary | ICD-10-CM | POA: Diagnosis not present

## 2018-09-10 HISTORY — PX: COLONOSCOPY: SHX174

## 2018-09-10 MED ORDER — SODIUM CHLORIDE 0.9 % IV SOLN: 500.0000 mL | Freq: Once | INTRAVENOUS | Status: DC

## 2018-09-10 NOTE — Progress Notes (Signed)
Pt's states no medical or surgical changes since previsit or office visit. 

## 2018-09-10 NOTE — Op Note (Signed)
[place] Patient Name: Benjamin Patton Procedure Date: 09/10/2018 8:11 AM MRN: [id number] Endoscopist: Docia Chuck. Henrene Pastor , MD Age: 67 Referring MD:  Date of Birth: January 04, 1951 Gender: Male Account #: [id number] Procedure:                Colonoscopy with cold snare polypectomy x 3 Indications:              Screening for colorectal malignant neoplasm.                            Previous examinations 2005 and 2011 with fair                            preparation Medicines:                Monitored Anesthesia Care Procedure:                Pre-Anesthesia Assessment:                           - Prior to the procedure, a History and Physical                            was performed, and patient medications and                            allergies were reviewed. The patient's tolerance of                            previous anesthesia was also reviewed. The risks                            and benefits of the procedure and the sedation                            options and risks were discussed with the patient.                            All questions were answered, and informed consent                            was obtained. Prior Anticoagulants: The patient has                            taken no previous anticoagulant or antiplatelet                            agents. ASA Grade Assessment: II - A patient with                            mild systemic disease. After reviewing the risks                            and benefits, the patient was deemed in  satisfactory condition to undergo the procedure.                           After obtaining informed consent, the colonoscope                            was passed under direct vision. Throughout the                            procedure, the patient's blood pressure, pulse, and                            oxygen saturations were monitored continuously. The                            Model CF-HQ190L  [id number]) scope was introduced                            through the anus and advanced to the the cecum,                            identified by appendiceal orifice and ileocecal                            valve. The ileocecal valve, appendiceal orifice,                            and rectum were photographed. The quality of the                            bowel preparation was excellent. The colonoscopy                            was performed without difficulty. The patient                            tolerated the procedure well. The bowel preparation                            used was SUPREP. Scope In: 8:16:20 AM Scope Out: 8:37:35 AM Scope Withdrawal Time: 0 hours 16 minutes 21 seconds  Total Procedure Duration: 0 hours 21 minutes 15 seconds  Findings:                 Three polyps were found in the transverse colon,                            ascending colon and cecum. The polyps were 2 to 4                            mm in size. These polyps were removed with a cold                            snare. Resection and retrieval were complete.  The exam was otherwise without abnormality on                            direct and retroflexion views. Complications:            No immediate complications. Estimated blood loss:                            None. Estimated Blood Loss:     Estimated blood loss: none. Impression:               - Three 2 to 4 mm polyps in the transverse colon,                            in the ascending colon and in the cecum, removed                            with a cold snare. Resected and retrieved.                           - The examination was otherwise normal on direct                            and retroflexion views. Recommendation:           - Repeat colonoscopy in 5 years for surveillance                            with same extensive prep.                           - Patient has a contact number available for                             emergencies. The signs and symptoms of potential                            delayed complications were discussed with the                            patient. Return to normal activities tomorrow.                            Written discharge instructions were provided to the                            patient.                           - Resume previous diet.                           - Continue present medications.                           - Await pathology results. Docia Chuck. Henrene Pastor, MD 09/10/2018 8:42:54 AM This  report has been signed electronically.

## 2018-09-10 NOTE — Progress Notes (Signed)
A and O x3. Report to RN. Tolerated MAC anesthesia well.

## 2018-09-10 NOTE — Patient Instructions (Signed)
YOU HAD AN ENDOSCOPIC PROCEDURE TODAY AT THE [facility]:   Refer to the procedure report that was given to you for any specific questions about what was found during the examination.  If the procedure report does not answer your questions, please call your gastroenterologist to clarify.  If you requested that your care partner not be given the details of your procedure findings, then the procedure report has been included in a sealed envelope for you to review at your convenience later.  YOU SHOULD EXPECT: Some feelings of bloating in the abdomen. Passage of more gas than usual.  Walking can help get rid of the air that was put into your GI tract during the procedure and reduce the bloating. If you had a lower endoscopy (such as a colonoscopy or flexible sigmoidoscopy) you may notice spotting of blood in your stool or on the toilet paper. If you underwent a bowel prep for your procedure, you may not have a normal bowel movement for a few days.  Please Note:  You might notice some irritation and congestion in your nose or some drainage.  This is from the oxygen used during your procedure.  There is no need for concern and it should clear up in a day or so.  SYMPTOMS TO REPORT IMMEDIATELY:   Following lower endoscopy (colonoscopy or flexible sigmoidoscopy):  Excessive amounts of blood in the stool  Significant tenderness or worsening of abdominal pains  Swelling of the abdomen that is new, acute  Fever of 100F or higher   For urgent or emergent issues, a gastroenterologist can be reached at any hour by calling [phone].   DIET:  We do recommend a small meal at first, but then you may proceed to your regular diet.  Drink plenty of fluids but you should avoid alcoholic beverages for 24 hours.  ACTIVITY:  You should plan to take it easy for the rest of today and you should NOT DRIVE or use heavy machinery until tomorrow (because of the sedation medicines used during the test).     FOLLOW UP: Our staff will call the number listed on your records the next business day following your procedure to check on you and address any questions or concerns that you may have regarding the information given to you following your procedure. If we do not reach you, we will leave a message.  However, if you are feeling well and you are not experiencing any problems, there is no need to return our call.  We will assume that you have returned to your regular daily activities without incident.  If any biopsies were taken you will be contacted by phone or by letter within the next 1-3 weeks.  Please call us at [phone] if you have not heard about the biopsies in 3 weeks.    SIGNATURES/CONFIDENTIALITY: You and/or your care partner have signed paperwork which will be entered into your electronic medical record.  These signatures attest to the fact that that the information above on your After Visit Summary has been reviewed and is understood.  Full responsibility of the confidentiality of this discharge information lies with you and/or your care-partner.  Polyp information given. 

## 2018-09-10 NOTE — Progress Notes (Signed)
Called to room to assist during endoscopic procedure.  Patient ID and intended procedure confirmed with present staff. Received instructions for my participation in the procedure from the performing physician.  

## 2018-09-11 ENCOUNTER — Telehealth: Payer: Self-pay

## 2018-09-11 DIAGNOSIS — R319 Hematuria, unspecified: Secondary | ICD-10-CM | POA: Diagnosis not present

## 2018-09-11 DIAGNOSIS — R3129 Other microscopic hematuria: Secondary | ICD-10-CM | POA: Diagnosis not present

## 2018-09-11 NOTE — Telephone Encounter (Signed)
  Follow up Call-  Call back number 09/10/2018  Post procedure Call Back phone  # [phone]  Permission to leave phone message Yes  Some recent data might be hidden     Left message

## 2018-09-11 NOTE — Telephone Encounter (Signed)
Left message

## 2018-09-13 ENCOUNTER — Encounter: Payer: Self-pay | Admitting: Internal Medicine

## 2018-09-18 DIAGNOSIS — Q625 Duplication of ureter: Secondary | ICD-10-CM | POA: Diagnosis not present

## 2018-09-18 DIAGNOSIS — R319 Hematuria, unspecified: Secondary | ICD-10-CM | POA: Diagnosis not present

## 2018-09-18 DIAGNOSIS — Q539 Undescended testicle, unspecified: Secondary | ICD-10-CM | POA: Diagnosis not present

## 2018-12-31 ENCOUNTER — Ambulatory Visit: Payer: BLUE CROSS/BLUE SHIELD | Admitting: Internal Medicine

## 2019-04-27 ENCOUNTER — Other Ambulatory Visit: Payer: Self-pay

## 2019-04-27 ENCOUNTER — Ambulatory Visit (INDEPENDENT_AMBULATORY_CARE_PROVIDER_SITE_OTHER): Payer: BLUE CROSS/BLUE SHIELD | Admitting: Internal Medicine

## 2019-04-27 ENCOUNTER — Ambulatory Visit (INDEPENDENT_AMBULATORY_CARE_PROVIDER_SITE_OTHER)
Admission: RE | Admit: 2019-04-27 | Discharge: 2019-04-27 | Disposition: A | Discharge status: Home / Self Care | Payer: BLUE CROSS/BLUE SHIELD | Source: Ambulatory Visit | Attending: Internal Medicine | Admitting: Internal Medicine

## 2019-04-27 ENCOUNTER — Encounter: Payer: Self-pay | Admitting: Internal Medicine

## 2019-04-27 VITALS — BP 128/80 | HR 94 | Temp 98.9°F | Ht 70.0 in | Wt 204.0 lb

## 2019-04-27 DIAGNOSIS — E7800 Pure hypercholesterolemia, unspecified: Secondary | ICD-10-CM | POA: Diagnosis not present

## 2019-04-27 DIAGNOSIS — L608 Other nail disorders: Secondary | ICD-10-CM | POA: Diagnosis not present

## 2019-04-27 DIAGNOSIS — E559 Vitamin D deficiency, unspecified: Secondary | ICD-10-CM

## 2019-04-27 DIAGNOSIS — I10 Essential (primary) hypertension: Secondary | ICD-10-CM

## 2019-04-27 DIAGNOSIS — M25531 Pain in right wrist: Secondary | ICD-10-CM | POA: Insufficient documentation

## 2019-04-27 DIAGNOSIS — M19031 Primary osteoarthritis, right wrist: Secondary | ICD-10-CM | POA: Diagnosis not present

## 2019-04-27 DIAGNOSIS — E538 Deficiency of other specified B group vitamins: Secondary | ICD-10-CM

## 2019-04-27 DIAGNOSIS — Z0000 Encounter for general adult medical examination without abnormal findings: Secondary | ICD-10-CM

## 2019-04-27 DIAGNOSIS — R7302 Impaired glucose tolerance (oral): Secondary | ICD-10-CM

## 2019-04-27 DIAGNOSIS — E611 Iron deficiency: Secondary | ICD-10-CM

## 2019-04-27 MED ORDER — METFORMIN HCL 500 MG PO TABS: 500.0000 mg | ORAL_TABLET | Freq: Two times a day (BID) | ORAL | 3 refills | Status: DC

## 2019-04-27 NOTE — Patient Instructions (Signed)
Ok to change the metformin ER to the regular metformin (I sent a new prescription)  Please continue all other medications as before, and refills have been done if requested.  Please have the pharmacy call with any other refills you may need.  Please continue your efforts at being more active, low cholesterol diet, and weight control  Please keep your appointments with your specialists as you may have planned  Please go to the XRAY Department in the Basement (go straight as you get off the elevator) for the x-ray testing  Please go to the LAB in the Basement (turn left off the elevator) for the tests to be done today  You will be contacted by phone if any changes need to be made immediately.  Otherwise, you will receive a letter about your results with an explanation, but please check with MyChart first.  Please remember to sign up for MyChart if you have not done so, as this will be important to you in the future with finding out test results, communicating by private email, and scheduling acute appointments online when needed.  Please return in 3 months, or sooner if needed, with Lab testing done 3-5 days before

## 2019-04-27 NOTE — Assessment & Plan Note (Signed)
stable overall by history and exam, recent data reviewed with pt, and pt to continue medical treatment as before,  to f/u any worsening symptoms or concerns  

## 2019-04-27 NOTE — Assessment & Plan Note (Signed)
With small effusion and persistent pain ? Wrist vs tendinopathy at the wrist level - for xray r/o fx, declines other pain med

## 2019-04-27 NOTE — Assessment & Plan Note (Addendum)
stable overall by history and exam, recent data reviewed with pt, and pt to continue medical treatment as before except to change the metformin ER to metformin IR due to recall,  to f/u any worsening symptoms or concerns

## 2019-04-27 NOTE — Assessment & Plan Note (Signed)
Mild benign, very unlikely to be malignant, to cont to follow for eventual improvement

## 2019-04-27 NOTE — Assessment & Plan Note (Signed)
Pt encouraged to check BP at home with goal < 140/90
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
## 2019-04-28 ENCOUNTER — Encounter: Payer: Self-pay | Admitting: Internal Medicine

## 2019-06-01 ENCOUNTER — Other Ambulatory Visit: Payer: Self-pay | Admitting: Internal Medicine

## 2019-06-10 ENCOUNTER — Telehealth: Payer: Self-pay | Admitting: Internal Medicine

## 2019-06-10 MED ORDER — AMLODIPINE BESY-BENAZEPRIL HCL 5-20 MG PO CAPS: 1.0000 | ORAL_CAPSULE | Freq: Every day | ORAL | 0 refills | Status: DC

## 2019-06-10 NOTE — Telephone Encounter (Signed)
Patient states that his pharmacy did not get the script for amlodipine on 7/6.  Patient is requesting this script to be resent.

## 2019-06-10 NOTE — Telephone Encounter (Signed)
Needs to go to [facility] on  [address]

## 2019-08-04 ENCOUNTER — Other Ambulatory Visit: Payer: Self-pay | Admitting: Internal Medicine

## 2019-08-04 ENCOUNTER — Encounter: Payer: Self-pay | Admitting: Internal Medicine

## 2019-08-04 ENCOUNTER — Other Ambulatory Visit: Payer: Self-pay

## 2019-08-04 ENCOUNTER — Ambulatory Visit (INDEPENDENT_AMBULATORY_CARE_PROVIDER_SITE_OTHER): Payer: BC Managed Care – PPO | Admitting: Internal Medicine

## 2019-08-04 ENCOUNTER — Other Ambulatory Visit (INDEPENDENT_AMBULATORY_CARE_PROVIDER_SITE_OTHER): Payer: BC Managed Care – PPO

## 2019-08-04 VITALS — BP 132/86 | HR 54 | Temp 98.1°F | Ht 70.0 in | Wt 204.0 lb

## 2019-08-04 DIAGNOSIS — E611 Iron deficiency: Secondary | ICD-10-CM | POA: Diagnosis not present

## 2019-08-04 DIAGNOSIS — E538 Deficiency of other specified B group vitamins: Secondary | ICD-10-CM

## 2019-08-04 DIAGNOSIS — E119 Type 2 diabetes mellitus without complications: Secondary | ICD-10-CM

## 2019-08-04 DIAGNOSIS — Z0000 Encounter for general adult medical examination without abnormal findings: Secondary | ICD-10-CM

## 2019-08-04 DIAGNOSIS — E7800 Pure hypercholesterolemia, unspecified: Secondary | ICD-10-CM | POA: Diagnosis not present

## 2019-08-04 DIAGNOSIS — Z125 Encounter for screening for malignant neoplasm of prostate: Secondary | ICD-10-CM | POA: Diagnosis not present

## 2019-08-04 DIAGNOSIS — E559 Vitamin D deficiency, unspecified: Secondary | ICD-10-CM | POA: Diagnosis not present

## 2019-08-04 LAB — CBC WITH DIFFERENTIAL/PLATELET
Basophils Absolute: 0.1 10*3/uL (ref 0.0–0.1)
Basophils Relative: 1 % (ref 0.0–3.0)
Eosinophils Absolute: 0.2 10*3/uL (ref 0.0–0.7)
Eosinophils Relative: 1.9 % (ref 0.0–5.0)
HCT: 44 % (ref 39.0–52.0)
Hemoglobin: 14.8 g/dL (ref 13.0–17.0)
Lymphocytes Relative: 43.5 % (ref 12.0–46.0)
Lymphs Abs: 4.5 10*3/uL — ABNORMAL HIGH (ref 0.7–4.0)
MCHC: 33.8 g/dL (ref 30.0–36.0)
MCV: 94.1 fl (ref 78.0–100.0)
Monocytes Absolute: 0.9 10*3/uL (ref 0.1–1.0)
Monocytes Relative: 8.7 % (ref 3.0–12.0)
Neutro Abs: 4.6 10*3/uL (ref 1.4–7.7)
Neutrophils Relative %: 44.9 % (ref 43.0–77.0)
Platelets: 310 10*3/uL (ref 150.0–400.0)
RBC: 4.67 Mil/uL (ref 4.22–5.81)
RDW: 13.7 % (ref 11.5–15.5)
WBC: 10.3 10*3/uL (ref 4.0–10.5)

## 2019-08-04 LAB — URINALYSIS, ROUTINE W REFLEX MICROSCOPIC
Bilirubin Urine: NEGATIVE
Ketones, ur: NEGATIVE
Leukocytes,Ua: NEGATIVE
Nitrite: NEGATIVE
Specific Gravity, Urine: 1.025 (ref 1.000–1.030)
Total Protein, Urine: NEGATIVE
Urine Glucose: NEGATIVE
Urobilinogen, UA: 0.2 (ref 0.0–1.0)
pH: 5.5 (ref 5.0–8.0)

## 2019-08-04 LAB — HEPATIC FUNCTION PANEL
ALT: 16 U/L (ref 0–53)
AST: 18 U/L (ref 0–37)
Albumin: 4.3 g/dL (ref 3.5–5.2)
Alkaline Phosphatase: 121 U/L — ABNORMAL HIGH (ref 39–117)
Bilirubin, Direct: 0.1 mg/dL (ref 0.0–0.3)
Total Bilirubin: 0.7 mg/dL (ref 0.2–1.2)
Total Protein: 7.7 g/dL (ref 6.0–8.3)

## 2019-08-04 LAB — LIPID PANEL
Cholesterol: 198 mg/dL (ref 0–200)
HDL: 34.3 mg/dL — ABNORMAL LOW (ref >39.00)
LDL Cholesterol: 134 mg/dL — ABNORMAL HIGH (ref 0–99)
NonHDL: 163.34
Total CHOL/HDL Ratio: 6
Triglycerides: 145 mg/dL (ref 0.0–149.0)
VLDL: 29 mg/dL (ref 0.0–40.0)

## 2019-08-04 LAB — MICROALBUMIN / CREATININE URINE RATIO
Creatinine,U: 108.5 mg/dL
Microalb Creat Ratio: 0.6 mg/g (ref 0.0–30.0)
Microalb, Ur: <0.7 mg/dL (ref 0.0–1.9)

## 2019-08-04 LAB — PSA: PSA: 1.78 ng/mL (ref 0.10–4.00)

## 2019-08-04 LAB — BASIC METABOLIC PANEL
BUN: 12 mg/dL (ref 6–23)
CO2: 29 mEq/L (ref 19–32)
Calcium: 9.6 mg/dL (ref 8.4–10.5)
Chloride: 103 mEq/L (ref 96–112)
Creatinine, Ser: 0.87 mg/dL (ref 0.40–1.50)
GFR: 105.97 mL/min (ref >60.00)
Glucose, Bld: 153 mg/dL — ABNORMAL HIGH (ref 70–99)
Potassium: 3.8 mEq/L (ref 3.5–5.1)
Sodium: 140 mEq/L (ref 135–145)

## 2019-08-04 LAB — VITAMIN B12: Vitamin B-12: 475 pg/mL (ref 211–911)

## 2019-08-04 LAB — IBC PANEL
Iron: 123 ug/dL (ref 42–165)
Saturation Ratios: 40.7 % (ref 20.0–50.0)
Transferrin: 216 mg/dL (ref 212.0–360.0)

## 2019-08-04 LAB — VITAMIN D 25 HYDROXY (VIT D DEFICIENCY, FRACTURES): VITD: 11.2 ng/mL — ABNORMAL LOW (ref 30.00–100.00)

## 2019-08-04 LAB — TSH: TSH: 2.49 u[IU]/mL (ref 0.35–4.50)

## 2019-08-04 LAB — HEMOGLOBIN A1C: Hgb A1c MFr Bld: 8 % — ABNORMAL HIGH (ref 4.6–6.5)

## 2019-08-04 MED ORDER — VITAMIN D (ERGOCALCIFEROL) 1.25 MG (50000 UNIT) PO CAPS: 50000.0000 [IU] | ORAL_CAPSULE | ORAL | 0 refills | Status: DC

## 2019-08-04 MED ORDER — METFORMIN HCL 500 MG PO TABS: 1000.0000 mg | ORAL_TABLET | Freq: Two times a day (BID) | ORAL | 3 refills | Status: DC
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
## 2019-08-04 NOTE — Assessment & Plan Note (Signed)
?   Control, for a1c with labs, for diet, wt control, f/u 6 mo

## 2019-08-04 NOTE — Assessment & Plan Note (Signed)

## 2019-08-04 NOTE — Patient Instructions (Signed)

## 2019-09-15 ENCOUNTER — Other Ambulatory Visit: Payer: Self-pay | Admitting: Internal Medicine

## 2019-11-14 ENCOUNTER — Other Ambulatory Visit: Payer: Self-pay | Admitting: Internal Medicine

## 2019-11-16 NOTE — Telephone Encounter (Signed)
No need for contd high dose Vit D  OK to plan to change to OTC Vitamin D3 at 2000 units per day, indefinitely.

## 2019-11-17 NOTE — Telephone Encounter (Signed)
Called pt there was no answer LMOM w/MD response../lmb 

## 2020-01-17 ENCOUNTER — Ambulatory Visit: Payer: BC Managed Care – PPO | Attending: Internal Medicine

## 2020-01-17 DIAGNOSIS — Z23 Encounter for immunization: Secondary | ICD-10-CM | POA: Insufficient documentation

## 2020-01-17 NOTE — Progress Notes (Signed)
   Covid-19 Vaccination Clinic  Name:  Benjamin Patton    MRN: [id number] DOB: May 03, 1951  01/17/2020  [name] was observed post Covid-19 immunization for 15 minutes without incidence. He was provided with Vaccine Information Sheet and instruction to access the V-Safe system.   Mr. Bend was instructed to call 911 with any severe reactions post vaccine: Marland Kitchen Difficulty breathing  . Swelling of your face and throat  . A fast heartbeat  . A bad rash all over your body  . Dizziness and weakness    Immunizations Administered    Name Date Dose VIS Date Route   Pfizer COVID-19 Vaccine 01/17/2020 10:22 AM 0.3 mL 11/06/2019 Intramuscular   Manufacturer: [place]   Lot: [id number]   [place]: [id number]

## 2020-02-02 ENCOUNTER — Other Ambulatory Visit: Payer: Self-pay

## 2020-02-02 ENCOUNTER — Encounter: Payer: Self-pay | Admitting: Internal Medicine

## 2020-02-02 ENCOUNTER — Ambulatory Visit (INDEPENDENT_AMBULATORY_CARE_PROVIDER_SITE_OTHER): Payer: BC Managed Care – PPO | Admitting: Internal Medicine

## 2020-02-02 VITALS — BP 158/94 | HR 74 | Temp 98.2°F | Ht 70.0 in | Wt 201.0 lb

## 2020-02-02 DIAGNOSIS — I10 Essential (primary) hypertension: Secondary | ICD-10-CM | POA: Diagnosis not present

## 2020-02-02 DIAGNOSIS — E7800 Pure hypercholesterolemia, unspecified: Secondary | ICD-10-CM

## 2020-02-02 DIAGNOSIS — E119 Type 2 diabetes mellitus without complications: Secondary | ICD-10-CM

## 2020-02-02 DIAGNOSIS — Z0000 Encounter for general adult medical examination without abnormal findings: Secondary | ICD-10-CM | POA: Diagnosis not present

## 2020-02-02 DIAGNOSIS — E559 Vitamin D deficiency, unspecified: Secondary | ICD-10-CM | POA: Diagnosis not present

## 2020-02-02 DIAGNOSIS — Z125 Encounter for screening for malignant neoplasm of prostate: Secondary | ICD-10-CM | POA: Diagnosis not present

## 2020-02-02 LAB — HEPATIC FUNCTION PANEL
ALT: 16 U/L (ref 0–53)
AST: 17 U/L (ref 0–37)
Albumin: 4.1 g/dL (ref 3.5–5.2)
Alkaline Phosphatase: 137 U/L — ABNORMAL HIGH (ref 39–117)
Bilirubin, Direct: 0.1 mg/dL (ref 0.0–0.3)
Total Bilirubin: 0.6 mg/dL (ref 0.2–1.2)
Total Protein: 7.5 g/dL (ref 6.0–8.3)

## 2020-02-02 LAB — CBC WITH DIFFERENTIAL/PLATELET
Basophils Absolute: 0.1 10*3/uL (ref 0.0–0.1)
Basophils Relative: 0.7 % (ref 0.0–3.0)
Eosinophils Absolute: 0.2 10*3/uL (ref 0.0–0.7)
Eosinophils Relative: 1.9 % (ref 0.0–5.0)
HCT: 43.3 % (ref 39.0–52.0)
Hemoglobin: 14.9 g/dL (ref 13.0–17.0)
Lymphocytes Relative: 44.5 % (ref 12.0–46.0)
Lymphs Abs: 4.3 10*3/uL — ABNORMAL HIGH (ref 0.7–4.0)
MCHC: 34.4 g/dL (ref 30.0–36.0)
MCV: 94.9 fl (ref 78.0–100.0)
Monocytes Absolute: 0.8 10*3/uL (ref 0.1–1.0)
Monocytes Relative: 8.1 % (ref 3.0–12.0)
Neutro Abs: 4.3 10*3/uL (ref 1.4–7.7)
Neutrophils Relative %: 44.8 % (ref 43.0–77.0)
Platelets: 317 10*3/uL (ref 150.0–400.0)
RBC: 4.56 Mil/uL (ref 4.22–5.81)
RDW: 12.9 % (ref 11.5–15.5)
WBC: 9.6 10*3/uL (ref 4.0–10.5)

## 2020-02-02 LAB — URINALYSIS, ROUTINE W REFLEX MICROSCOPIC
Bilirubin Urine: NEGATIVE
Ketones, ur: NEGATIVE
Leukocytes,Ua: NEGATIVE
Nitrite: NEGATIVE
RBC / HPF: NONE SEEN (ref 0–?)
Specific Gravity, Urine: 1.025 (ref 1.000–1.030)
Total Protein, Urine: NEGATIVE
Urine Glucose: >=1000 — AB
Urobilinogen, UA: 0.2 (ref 0.0–1.0)
pH: 5.5 (ref 5.0–8.0)

## 2020-02-02 LAB — BASIC METABOLIC PANEL
BUN: 14 mg/dL (ref 6–23)
CO2: 28 mEq/L (ref 19–32)
Calcium: 9.5 mg/dL (ref 8.4–10.5)
Chloride: 102 mEq/L (ref 96–112)
Creatinine, Ser: 0.88 mg/dL (ref 0.40–1.50)
GFR: 104.43 mL/min (ref >60.00)
Glucose, Bld: 261 mg/dL — ABNORMAL HIGH (ref 70–99)
Potassium: 4.5 mEq/L (ref 3.5–5.1)
Sodium: 136 mEq/L (ref 135–145)

## 2020-02-02 LAB — MICROALBUMIN / CREATININE URINE RATIO
Creatinine,U: 111.6 mg/dL
Microalb Creat Ratio: 0.6 mg/g (ref 0.0–30.0)
Microalb, Ur: <0.7 mg/dL (ref 0.0–1.9)

## 2020-02-02 LAB — LIPID PANEL
Cholesterol: 209 mg/dL — ABNORMAL HIGH (ref 0–200)
HDL: 34 mg/dL — ABNORMAL LOW (ref >39.00)
LDL Cholesterol: 145 mg/dL — ABNORMAL HIGH (ref 0–99)
NonHDL: 175.21
Total CHOL/HDL Ratio: 6
Triglycerides: 151 mg/dL — ABNORMAL HIGH (ref 0.0–149.0)
VLDL: 30.2 mg/dL (ref 0.0–40.0)

## 2020-02-02 LAB — HEMOGLOBIN A1C: Hgb A1c MFr Bld: 10.1 % — ABNORMAL HIGH (ref 4.6–6.5)

## 2020-02-02 LAB — TSH: TSH: 1.3 u[IU]/mL (ref 0.35–4.50)

## 2020-02-02 LAB — VITAMIN D 25 HYDROXY (VIT D DEFICIENCY, FRACTURES): VITD: 20.6 ng/mL — ABNORMAL LOW (ref 30.00–100.00)

## 2020-02-02 LAB — PSA: PSA: 1.4 ng/mL (ref 0.10–4.00)

## 2020-02-02 MED ORDER — AMLODIPINE BESY-BENAZEPRIL HCL 10-20 MG PO CAPS: 1.0000 | ORAL_CAPSULE | Freq: Every day | ORAL | 3 refills | Status: DC

## 2020-02-02 NOTE — Assessment & Plan Note (Signed)
stable overall by history and exam, recent data reviewed with pt, and pt to continue medical treatment as before,  to f/u any worsening symptoms or concerns  

## 2020-02-02 NOTE — Assessment & Plan Note (Signed)
For f/u lab 

## 2020-02-02 NOTE — Assessment & Plan Note (Signed)

## 2020-02-02 NOTE — Assessment & Plan Note (Signed)
Declines statin 

## 2020-02-02 NOTE — Patient Instructions (Addendum)
Please take OTC Vitamin D3 at 2000 units per day, indefinitely.  Ok to increase the generic lotrel to the 10 - 20 mg per day  Please continue all other medications as before, and refills have been done if requested.  Please have the pharmacy call with any other refills you may need.  Please continue your efforts at being more active, low cholesterol diet, and weight control.  You are otherwise up to date with prevention measures today.  Please keep your appointments with your specialists as you may have planned  Please go to the LAB at the blood drawing area for the tests to be done  You will be contacted by phone if any changes need to be made immediately.  Otherwise, you will receive a letter about your results with an explanation, but please check with MyChart first.  Please remember to sign up for MyChart if you have not done so, as this will be important to you in the future with finding out test results, communicating by private email, and scheduling acute appointments online when needed.  Please make an Appointment to return in 6 months, or sooner if needed

## 2020-02-02 NOTE — Assessment & Plan Note (Signed)
Uncontrolled, for increased lotrl to 10/20
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
## 2020-02-04 ENCOUNTER — Encounter: Payer: Self-pay | Admitting: Internal Medicine

## 2020-02-04 ENCOUNTER — Other Ambulatory Visit: Payer: Self-pay | Admitting: Internal Medicine

## 2020-02-04 DIAGNOSIS — E119 Type 2 diabetes mellitus without complications: Secondary | ICD-10-CM

## 2020-02-04 MED ORDER — ATORVASTATIN CALCIUM 20 MG PO TABS: 20.0000 mg | ORAL_TABLET | Freq: Every day | ORAL | 3 refills | Status: DC

## 2020-02-04 MED ORDER — GLIPIZIDE ER 10 MG PO TB24: 10.0000 mg | ORAL_TABLET | Freq: Every day | ORAL | 3 refills | Status: DC

## 2020-02-04 MED ORDER — VITAMIN D (ERGOCALCIFEROL) 1.25 MG (50000 UNIT) PO CAPS: 50000.0000 [IU] | ORAL_CAPSULE | ORAL | 0 refills | Status: DC

## 2020-02-05 ENCOUNTER — Telehealth: Payer: Self-pay

## 2020-02-05 NOTE — Telephone Encounter (Signed)
New message    Calling for test results  

## 2020-02-05 NOTE — Telephone Encounter (Signed)
Returned call to patient today but not able to reach him.

## 2020-02-09 ENCOUNTER — Ambulatory Visit: Payer: BC Managed Care – PPO | Attending: Internal Medicine

## 2020-02-09 DIAGNOSIS — Z23 Encounter for immunization: Secondary | ICD-10-CM

## 2020-02-09 NOTE — Progress Notes (Signed)
   Covid-19 Vaccination Clinic  Name:  Benjamin Patton    MRN: [id number] DOB: 20-Oct-1951  02/09/2020  [name] was observed post Covid-19 immunization for 15 minutes without incident. He was provided with Vaccine Information Sheet and instruction to access the V-Safe system.   [name] was instructed to call 911 with any severe reactions post vaccine: Marland Kitchen Difficulty breathing  . Swelling of face and throat  . A fast heartbeat  . A bad rash all over body  . Dizziness and weakness   Immunizations Administered    Name Date Dose VIS Date Route   Pfizer COVID-19 Vaccine 02/09/2020  8:23 AM 0.3 mL 11/06/2019 Intramuscular   Manufacturer: [place]   Lot: [id number]   [place]: [id number]

## 2020-03-03 ENCOUNTER — Encounter: Payer: Self-pay | Admitting: Internal Medicine

## 2020-04-08 LAB — HM DIABETES EYE EXAM

## 2020-04-11 ENCOUNTER — Encounter: Payer: Self-pay | Admitting: Internal Medicine

## 2020-04-23 ENCOUNTER — Other Ambulatory Visit: Payer: Self-pay | Admitting: Internal Medicine

## 2020-04-23 NOTE — Telephone Encounter (Signed)
Please refill as per office routine med refill policy (all routine meds refilled for 3 mo or monthly per pt preference up to one year from last visit, then month to month grace period for 3 mo, then further med refills will have to be denied)  

## 2020-04-26 MED ORDER — GLIMEPIRIDE 4 MG PO TABS: 4.0000 mg | ORAL_TABLET | Freq: Every day | ORAL | 3 refills | Status: DC

## 2020-04-26 NOTE — Telephone Encounter (Signed)
Ok for change - done erx 

## 2020-04-28 ENCOUNTER — Other Ambulatory Visit: Payer: Self-pay | Admitting: Internal Medicine

## 2020-06-08 ENCOUNTER — Other Ambulatory Visit: Payer: Self-pay | Admitting: Internal Medicine

## 2020-07-22 DIAGNOSIS — E119 Type 2 diabetes mellitus without complications: Secondary | ICD-10-CM | POA: Diagnosis not present

## 2020-07-22 DIAGNOSIS — H40022 Open angle with borderline findings, high risk, left eye: Secondary | ICD-10-CM | POA: Diagnosis not present

## 2020-07-22 DIAGNOSIS — H2513 Age-related nuclear cataract, bilateral: Secondary | ICD-10-CM | POA: Diagnosis not present

## 2020-08-23 DIAGNOSIS — H40022 Open angle with borderline findings, high risk, left eye: Secondary | ICD-10-CM | POA: Diagnosis not present

## 2020-09-20 DIAGNOSIS — H40022 Open angle with borderline findings, high risk, left eye: Secondary | ICD-10-CM | POA: Diagnosis not present

## 2020-09-28 ENCOUNTER — Other Ambulatory Visit: Payer: Self-pay

## 2020-09-28 ENCOUNTER — Ambulatory Visit
Admission: EM | Admit: 2020-09-28 | Discharge: 2020-09-28 | Disposition: A | Discharge status: Home / Self Care | Payer: PPO | Attending: Physician Assistant | Admitting: Physician Assistant

## 2020-09-28 ENCOUNTER — Encounter: Payer: Self-pay | Admitting: Emergency Medicine

## 2020-09-28 DIAGNOSIS — M542 Cervicalgia: Secondary | ICD-10-CM

## 2020-09-28 MED ORDER — DICLOFENAC SODIUM 1 % EX GEL: 2.0000 g | Freq: Four times a day (QID) | CUTANEOUS | 0 refills | Status: DC

## 2020-09-28 MED ORDER — MELOXICAM 7.5 MG PO TABS: 7.5000 mg | ORAL_TABLET | Freq: Every day | ORAL | 0 refills | Status: DC
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
## 2020-09-28 NOTE — Discharge Instructions (Addendum)
Start Mobic. Do not take ibuprofen (motrin/advil)/ naproxen (aleve) while on mobic. Voltaren gel as needed. Continue compresses, rest. As pain improves, can start stretches. Follow up with PCP if symptoms not improving. Monitor for loss of grip strength.

## 2020-09-28 NOTE — ED Triage Notes (Signed)
Patient c/o RT sided neck stiffness x 3 days.   Patient stated he worked out the morning before the symptoms began.   Patient stated the stiffness has progressively became worst. Patient has difficulty with ROM of his neck.   Patient has used warm compresses and Ibuprofen "one pill" w/o any relief of symptoms.

## 2020-10-18 DIAGNOSIS — H40022 Open angle with borderline findings, high risk, left eye: Secondary | ICD-10-CM | POA: Diagnosis not present

## 2020-11-07 ENCOUNTER — Other Ambulatory Visit: Payer: Self-pay

## 2020-11-08 ENCOUNTER — Encounter: Payer: Self-pay | Admitting: Internal Medicine

## 2020-11-08 ENCOUNTER — Ambulatory Visit (INDEPENDENT_AMBULATORY_CARE_PROVIDER_SITE_OTHER): Payer: PPO | Admitting: Internal Medicine

## 2020-11-08 ENCOUNTER — Ambulatory Visit (INDEPENDENT_AMBULATORY_CARE_PROVIDER_SITE_OTHER): Payer: PPO

## 2020-11-08 ENCOUNTER — Encounter (INDEPENDENT_AMBULATORY_CARE_PROVIDER_SITE_OTHER): Payer: Self-pay

## 2020-11-08 VITALS — BP 118/78 | HR 65 | Temp 98.8°F | Ht 70.0 in | Wt 191.0 lb

## 2020-11-08 DIAGNOSIS — E1165 Type 2 diabetes mellitus with hyperglycemia: Secondary | ICD-10-CM

## 2020-11-08 DIAGNOSIS — I10 Essential (primary) hypertension: Secondary | ICD-10-CM

## 2020-11-08 DIAGNOSIS — N281 Cyst of kidney, acquired: Secondary | ICD-10-CM

## 2020-11-08 DIAGNOSIS — E7800 Pure hypercholesterolemia, unspecified: Secondary | ICD-10-CM

## 2020-11-08 DIAGNOSIS — R634 Abnormal weight loss: Secondary | ICD-10-CM

## 2020-11-08 DIAGNOSIS — E559 Vitamin D deficiency, unspecified: Secondary | ICD-10-CM | POA: Diagnosis not present

## 2020-11-08 LAB — HEMOGLOBIN A1C: Hgb A1c MFr Bld: 13 % — ABNORMAL HIGH (ref 4.6–6.5)

## 2020-11-08 LAB — BASIC METABOLIC PANEL
BUN: 16 mg/dL (ref 6–23)
CO2: 28 mEq/L (ref 19–32)
Calcium: 9.6 mg/dL (ref 8.4–10.5)
Chloride: 97 mEq/L (ref 96–112)
Creatinine, Ser: 1.04 mg/dL (ref 0.40–1.50)
GFR: 74.01 mL/min (ref >60.00)
Glucose, Bld: 291 mg/dL — ABNORMAL HIGH (ref 70–99)
Potassium: 4.1 mEq/L (ref 3.5–5.1)
Sodium: 133 mEq/L — ABNORMAL LOW (ref 135–145)

## 2020-11-08 LAB — URINALYSIS, ROUTINE W REFLEX MICROSCOPIC
Bilirubin Urine: NEGATIVE
Leukocytes,Ua: NEGATIVE
Nitrite: NEGATIVE
Specific Gravity, Urine: >=1.03 — AB (ref 1.000–1.030)
Total Protein, Urine: NEGATIVE
Urine Glucose: >=1000 — AB
Urobilinogen, UA: 1 (ref 0.0–1.0)
pH: 5.5 (ref 5.0–8.0)

## 2020-11-08 LAB — CBC WITH DIFFERENTIAL/PLATELET
Basophils Absolute: 0.1 10*3/uL (ref 0.0–0.1)
Basophils Relative: 0.7 % (ref 0.0–3.0)
Eosinophils Absolute: 0.2 10*3/uL (ref 0.0–0.7)
Eosinophils Relative: 1.7 % (ref 0.0–5.0)
HCT: 47.2 % (ref 39.0–52.0)
Hemoglobin: 15.9 g/dL (ref 13.0–17.0)
Lymphocytes Relative: 42.2 % (ref 12.0–46.0)
Lymphs Abs: 4.4 10*3/uL — ABNORMAL HIGH (ref 0.7–4.0)
MCHC: 33.8 g/dL (ref 30.0–36.0)
MCV: 94.1 fl (ref 78.0–100.0)
Monocytes Absolute: 0.9 10*3/uL (ref 0.1–1.0)
Monocytes Relative: 8.9 % (ref 3.0–12.0)
Neutro Abs: 4.9 10*3/uL (ref 1.4–7.7)
Neutrophils Relative %: 46.5 % (ref 43.0–77.0)
Platelets: 305 10*3/uL (ref 150.0–400.0)
RBC: 5.02 Mil/uL (ref 4.22–5.81)
RDW: 13.9 % (ref 11.5–15.5)
WBC: 10.5 10*3/uL (ref 4.0–10.5)

## 2020-11-08 LAB — LIPID PANEL
Cholesterol: 250 mg/dL — ABNORMAL HIGH (ref 0–200)
HDL: 33 mg/dL — ABNORMAL LOW (ref >39.00)
NonHDL: 217.32
Total CHOL/HDL Ratio: 8
Triglycerides: 205 mg/dL — ABNORMAL HIGH (ref 0.0–149.0)
VLDL: 41 mg/dL — ABNORMAL HIGH (ref 0.0–40.0)

## 2020-11-08 LAB — HEPATIC FUNCTION PANEL
ALT: 18 U/L (ref 0–53)
AST: 17 U/L (ref 0–37)
Albumin: 4.2 g/dL (ref 3.5–5.2)
Alkaline Phosphatase: 127 U/L — ABNORMAL HIGH (ref 39–117)
Bilirubin, Direct: 0.2 mg/dL (ref 0.0–0.3)
Total Bilirubin: 0.9 mg/dL (ref 0.2–1.2)
Total Protein: 8.1 g/dL (ref 6.0–8.3)

## 2020-11-08 LAB — TSH: TSH: 2.19 u[IU]/mL (ref 0.35–4.50)

## 2020-11-08 LAB — LDL CHOLESTEROL, DIRECT: Direct LDL: 183 mg/dL

## 2020-11-08 LAB — VITAMIN D 25 HYDROXY (VIT D DEFICIENCY, FRACTURES): VITD: 19.11 ng/mL — ABNORMAL LOW (ref 30.00–100.00)

## 2020-11-08 MED ORDER — AMLODIPINE BESY-BENAZEPRIL HCL 10-20 MG PO CAPS: 1.0000 | ORAL_CAPSULE | Freq: Every day | ORAL | 3 refills | Status: DC

## 2020-11-08 NOTE — Progress Notes (Deleted)
   Subjective:    Patient ID: Benjamin Patton, male    DOB: April 28, 1951, 69 y.o.   MRN: [id number]  HPI   Has been losing wt for unclera reason except for taking the metformin start. Sometimes misses bfast or lunch some days just busy, even though retired June 1.  Had to stop exercise due to wt loss.  Wt Readings from Last 3 Encounters:  11/08/20 191 lb (86.6 kg)  02/02/20 201 lb (91.2 kg)  08/04/19 204 lb (92.5 kg)     Review of Systems     Objective:   Physical Exam BP 118/78 (BP Location: Left Arm, Patient Position: Sitting, Cuff Size: Large)   Pulse 65   Temp 98.8 F (37.1 C) (Oral)   Ht 5\' 10"  (1.778 m)   Wt 191 lb (86.6 kg)   SpO2 98%   BMI 27.41 kg/m         Assessment & Plan:

## 2020-11-08 NOTE — Patient Instructions (Signed)
Please continue all other medications as before, and refills have been done if requested.  Please have the pharmacy call with any other refills you may need.  Please continue your efforts at being more active, low cholesterol diet, and weight control.  You are otherwise up to date with prevention measures today.  Please keep your appointments with your specialists as you may have planned  You will be contacted regarding the referral for: CT scan for the abdomen/pelvis  Please go to the XRAY Department in the first floor for the x-ray testing  Please go to the LAB at the blood drawing area for the tests to be done  You will be contacted by phone if any changes need to be made immediately.  Otherwise, you will receive a letter about your results with an explanation, but please check with MyChart first.  Please remember to sign up for MyChart if you have not done so, as this will be important to you in the future with finding out test results, communicating by private email, and scheduling acute appointments online when needed.  Please make an Appointment to return in 6 months, or sooner if needed

## 2020-11-09 ENCOUNTER — Ambulatory Visit
Admission: RE | Admit: 2020-11-09 | Discharge: 2020-11-09 | Disposition: A | Discharge status: Home / Self Care | Payer: PPO | Source: Ambulatory Visit | Attending: Internal Medicine | Admitting: Internal Medicine

## 2020-11-09 DIAGNOSIS — D7389 Other diseases of spleen: Secondary | ICD-10-CM | POA: Diagnosis not present

## 2020-11-09 DIAGNOSIS — Q630 Accessory kidney: Secondary | ICD-10-CM | POA: Diagnosis not present

## 2020-11-09 DIAGNOSIS — K529 Noninfective gastroenteritis and colitis, unspecified: Secondary | ICD-10-CM | POA: Diagnosis not present

## 2020-11-09 DIAGNOSIS — N281 Cyst of kidney, acquired: Secondary | ICD-10-CM

## 2020-11-09 MED ORDER — IOPAMIDOL (ISOVUE-300) INJECTION 61%
100.0000 mL | Freq: Once | INTRAVENOUS | Status: AC | PRN
  Administered 2020-11-09: 14:00:00 100 mL via INTRAVENOUS

## 2020-11-10 ENCOUNTER — Encounter: Payer: Self-pay | Admitting: Internal Medicine

## 2020-11-10 ENCOUNTER — Other Ambulatory Visit: Payer: Self-pay | Admitting: Internal Medicine

## 2020-11-10 DIAGNOSIS — I700 Atherosclerosis of aorta: Secondary | ICD-10-CM | POA: Insufficient documentation

## 2020-11-10 DIAGNOSIS — R935 Abnormal findings on diagnostic imaging of other abdominal regions, including retroperitoneum: Secondary | ICD-10-CM

## 2020-11-14 DIAGNOSIS — H1132 Conjunctival hemorrhage, left eye: Secondary | ICD-10-CM | POA: Diagnosis not present

## 2020-11-16 ENCOUNTER — Other Ambulatory Visit: Payer: Self-pay | Admitting: Internal Medicine

## 2020-11-16 ENCOUNTER — Encounter: Payer: Self-pay | Admitting: Internal Medicine

## 2020-11-16 DIAGNOSIS — E1165 Type 2 diabetes mellitus with hyperglycemia: Secondary | ICD-10-CM

## 2020-11-24 ENCOUNTER — Encounter: Payer: Self-pay | Admitting: Internal Medicine

## 2020-11-24 DIAGNOSIS — R634 Abnormal weight loss: Secondary | ICD-10-CM | POA: Insufficient documentation

## 2020-11-24 NOTE — Assessment & Plan Note (Signed)
Etiology unclear, for cxr, labs as ordered

## 2020-11-24 NOTE — Assessment & Plan Note (Signed)
Last vitamin D Lab Results  Component Value Date   VD25OH 19.11 (L) 11/08/2020  for vit d 2000 u qd

## 2020-11-24 NOTE — Assessment & Plan Note (Signed)
Also for f/u ct abd/pelvis

## 2020-11-24 NOTE — Assessment & Plan Note (Signed)
Lab Results  Component Value Date   LDLCALC 145 (H) 02/02/2020  pt prefers no statin for now, for lower chol diet
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
## 2020-11-24 NOTE — Assessment & Plan Note (Signed)
?   Control, for f/u a1c, consider endo if not improved

## 2020-11-24 NOTE — Assessment & Plan Note (Signed)
BP Readings from Last 3 Encounters:  11/08/20 118/78  09/28/20 (!) 139/102  02/02/20 (!) 158/94  improved, stable overall by history and exam, recent data reviewed with pt, and pt to continue medical treatment as before,  to f/u any worsening symptoms or concerns

## 2020-11-29 DIAGNOSIS — H401131 Primary open-angle glaucoma, bilateral, mild stage: Secondary | ICD-10-CM | POA: Diagnosis not present

## 2021-01-16 ENCOUNTER — Other Ambulatory Visit: Payer: Self-pay

## 2021-01-18 ENCOUNTER — Ambulatory Visit: Payer: PPO | Admitting: Endocrinology

## 2021-01-18 ENCOUNTER — Encounter: Payer: Self-pay | Admitting: Endocrinology

## 2021-01-18 ENCOUNTER — Other Ambulatory Visit: Payer: Self-pay

## 2021-01-18 VITALS — BP 120/90 | HR 78 | Ht 71.0 in | Wt 195.2 lb

## 2021-01-18 DIAGNOSIS — E1165 Type 2 diabetes mellitus with hyperglycemia: Secondary | ICD-10-CM

## 2021-01-18 LAB — POCT GLYCOSYLATED HEMOGLOBIN (HGB A1C): Hemoglobin A1C: 10.6 % — AB (ref 4.0–5.6)

## 2021-01-18 MED ORDER — ACCU-CHEK GUIDE VI STRP: 1.0000 | ORAL_STRIP | Freq: Every day | 12 refills | Status: DC

## 2021-01-18 MED ORDER — RYBELSUS 3 MG PO TABS: 3.0000 mg | ORAL_TABLET | Freq: Every day | ORAL | 3 refills | Status: DC

## 2021-01-18 NOTE — Patient Instructions (Addendum)
good diet and exercise significantly improve the control of your diabetes.  please let me know if you wish to be referred to a dietician.  high blood sugar is very risky to your health.  you should see an eye doctor and dentist every year.  It is very important to get all recommended vaccinations.  Controlling your blood pressure and cholesterol drastically reduces the damage diabetes does to your body.  Those who smoke should quit.  Please discuss these with your doctor.  check your blood sugar once a day.  vary the time of day when you check, between before the 3 meals, and at bedtime.  also check if you have symptoms of your blood sugar being too high or too low.  please keep a record of the readings and bring it to your next appointment here (or you can bring the meter itself).  You can write it on any piece of paper.  please call us sooner if your blood sugar goes below 70, or if most of your readings are over 200.   Here is a new meter.  I have sent a prescription to your pharmacy, for strips.   We will need to take this complex situation in stages.    For now, please: Please continue the same metformin, and:  I have sent a prescription to your pharmacy, to add "Rybelsus."  Please come back for a follow-up appointment in 1 month.
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
## 2021-02-06 ENCOUNTER — Ambulatory Visit: Payer: PPO | Admitting: Internal Medicine

## 2021-02-06 ENCOUNTER — Encounter: Payer: Self-pay | Admitting: Internal Medicine

## 2021-02-06 ENCOUNTER — Other Ambulatory Visit (INDEPENDENT_AMBULATORY_CARE_PROVIDER_SITE_OTHER): Payer: PPO

## 2021-02-06 VITALS — BP 128/84 | HR 58 | Ht 71.0 in | Wt 191.0 lb

## 2021-02-06 DIAGNOSIS — R935 Abnormal findings on diagnostic imaging of other abdominal regions, including retroperitoneum: Secondary | ICD-10-CM

## 2021-02-06 DIAGNOSIS — Z86010 Personal history of colonic polyps: Secondary | ICD-10-CM

## 2021-02-06 LAB — BASIC METABOLIC PANEL
BUN: 10 mg/dL (ref 6–23)
CO2: 28 mEq/L (ref 19–32)
Calcium: 9.1 mg/dL (ref 8.4–10.5)
Chloride: 102 mEq/L (ref 96–112)
Creatinine, Ser: 0.81 mg/dL (ref 0.40–1.50)
GFR: 90.72 mL/min (ref >60.00)
Glucose, Bld: 168 mg/dL — ABNORMAL HIGH (ref 70–99)
Potassium: 3.6 mEq/L (ref 3.5–5.1)
Sodium: 138 mEq/L (ref 135–145)
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
## 2021-02-06 NOTE — Patient Instructions (Signed)
Your provider has requested that you go to the basement level for lab work before leaving today. Press "B" on the elevator. The lab is located at the first door on the left as you exit the elevator.  You have been scheduled for a CT Enterography scan of the abdomen and pelvis at [facility] ([address]---this is in the same building as [employer]).   You are scheduled on 02/09/2021 at 4:00pm. You should arrive at 2:45pm for registration and to drink contrast. Please follow the written instructions below on the day of your exam:  WARNING: IF YOU ARE ALLERGIC TO IODINE/X-RAY DYE, PLEASE NOTIFY RADIOLOGY IMMEDIATELY AT [phone]! YOU WILL BE GIVEN A 13 HOUR PREMEDICATION PREP.  1) Do not eat or drink anything after 12:00pm (4 hours prior to your test)  You may take any medications as prescribed with a small amount of water, if necessary. If you take any of the following medications: METFORMIN, GLUCOPHAGE, GLUCOVANCE, AVANDAMET, RIOMET, FORTAMET, [place]er MET, JANUMET, GLUMETZA or METAGLIP, you MAY be asked to HOLD this medication 48 hours AFTER the exam.  The purpose of you drinking the oral contrast is to aid in the visualization of your intestinal tract. The contrast solution may cause some diarrhea. Depending on your individual set of symptoms, you may also receive an intravenous injection of x-ray contrast/dye. Plan on being at [facility] for 30 minutes or longer, depending on the type of exam you are having performed.  This test typically takes 30-45 minutes to complete.  If you have any questions regarding your exam or if you need to reschedule, you may call the CT department at [phone] between the hours of 8:00 am and 5:00 pm, Monday-Friday.  ________________________________________________________________________

## 2021-02-09 ENCOUNTER — Other Ambulatory Visit: Payer: Self-pay

## 2021-02-09 ENCOUNTER — Ambulatory Visit (INDEPENDENT_AMBULATORY_CARE_PROVIDER_SITE_OTHER)
Admission: RE | Admit: 2021-02-09 | Discharge: 2021-02-09 | Disposition: A | Discharge status: Home / Self Care | Payer: PPO | Source: Ambulatory Visit | Attending: Internal Medicine | Admitting: Internal Medicine

## 2021-02-09 DIAGNOSIS — R935 Abnormal findings on diagnostic imaging of other abdominal regions, including retroperitoneum: Secondary | ICD-10-CM | POA: Diagnosis not present

## 2021-02-09 DIAGNOSIS — K760 Fatty (change of) liver, not elsewhere classified: Secondary | ICD-10-CM | POA: Diagnosis not present

## 2021-02-09 MED ORDER — IOHEXOL 300 MG/ML  SOLN
100.0000 mL | Freq: Once | INTRAMUSCULAR | Status: AC | PRN
  Administered 2021-02-09: 100 mL via INTRAVENOUS

## 2021-03-28 DIAGNOSIS — H5203 Hypermetropia, bilateral: Secondary | ICD-10-CM | POA: Diagnosis not present

## 2021-03-28 DIAGNOSIS — H524 Presbyopia: Secondary | ICD-10-CM | POA: Diagnosis not present

## 2021-03-28 DIAGNOSIS — H2513 Age-related nuclear cataract, bilateral: Secondary | ICD-10-CM | POA: Diagnosis not present

## 2021-03-28 DIAGNOSIS — H401131 Primary open-angle glaucoma, bilateral, mild stage: Secondary | ICD-10-CM | POA: Diagnosis not present

## 2021-03-28 DIAGNOSIS — E119 Type 2 diabetes mellitus without complications: Secondary | ICD-10-CM | POA: Diagnosis not present

## 2021-03-28 LAB — HM DIABETES EYE EXAM

## 2021-03-31 ENCOUNTER — Encounter: Payer: Self-pay | Admitting: Internal Medicine

## 2021-04-06 ENCOUNTER — Encounter: Payer: Self-pay | Admitting: Internal Medicine

## 2021-04-21 ENCOUNTER — Other Ambulatory Visit: Payer: Self-pay | Admitting: Internal Medicine

## 2021-05-09 ENCOUNTER — Ambulatory Visit (INDEPENDENT_AMBULATORY_CARE_PROVIDER_SITE_OTHER): Payer: PPO | Admitting: Internal Medicine

## 2021-05-09 ENCOUNTER — Encounter: Payer: Self-pay | Admitting: Internal Medicine

## 2021-05-09 ENCOUNTER — Other Ambulatory Visit: Payer: Self-pay

## 2021-05-09 VITALS — BP 134/88 | HR 71 | Temp 98.1°F | Ht 71.0 in | Wt 197.0 lb

## 2021-05-09 DIAGNOSIS — E1165 Type 2 diabetes mellitus with hyperglycemia: Secondary | ICD-10-CM | POA: Diagnosis not present

## 2021-05-09 DIAGNOSIS — E559 Vitamin D deficiency, unspecified: Secondary | ICD-10-CM | POA: Diagnosis not present

## 2021-05-09 DIAGNOSIS — I700 Atherosclerosis of aorta: Secondary | ICD-10-CM | POA: Diagnosis not present

## 2021-05-09 DIAGNOSIS — Z23 Encounter for immunization: Secondary | ICD-10-CM

## 2021-05-09 DIAGNOSIS — Z0000 Encounter for general adult medical examination without abnormal findings: Secondary | ICD-10-CM | POA: Diagnosis not present

## 2021-05-09 LAB — CBC WITH DIFFERENTIAL/PLATELET
Basophils Absolute: 0.1 10*3/uL (ref 0.0–0.1)
Basophils Relative: 0.7 % (ref 0.0–3.0)
Eosinophils Absolute: 0.2 10*3/uL (ref 0.0–0.7)
Eosinophils Relative: 1.9 % (ref 0.0–5.0)
HCT: 44.7 % (ref 39.0–52.0)
Hemoglobin: 15 g/dL (ref 13.0–17.0)
Lymphocytes Relative: 44.8 % (ref 12.0–46.0)
Lymphs Abs: 4.2 10*3/uL — ABNORMAL HIGH (ref 0.7–4.0)
MCHC: 33.5 g/dL (ref 30.0–36.0)
MCV: 93.1 fl (ref 78.0–100.0)
Monocytes Absolute: 0.9 10*3/uL (ref 0.1–1.0)
Monocytes Relative: 9.6 % (ref 3.0–12.0)
Neutro Abs: 4 10*3/uL (ref 1.4–7.7)
Neutrophils Relative %: 43 % (ref 43.0–77.0)
Platelets: 318 10*3/uL (ref 150.0–400.0)
RBC: 4.8 Mil/uL (ref 4.22–5.81)
RDW: 13.5 % (ref 11.5–15.5)
WBC: 9.3 10*3/uL (ref 4.0–10.5)

## 2021-05-09 LAB — MICROALBUMIN / CREATININE URINE RATIO
Creatinine,U: 174.9 mg/dL
Microalb Creat Ratio: 0.4 mg/g (ref 0.0–30.0)
Microalb, Ur: <0.7 mg/dL (ref 0.0–1.9)

## 2021-05-09 LAB — HEPATIC FUNCTION PANEL
ALT: 13 U/L (ref 0–53)
AST: 16 U/L (ref 0–37)
Albumin: 4.3 g/dL (ref 3.5–5.2)
Alkaline Phosphatase: 111 U/L (ref 39–117)
Bilirubin, Direct: 0.1 mg/dL (ref 0.0–0.3)
Total Bilirubin: 1 mg/dL (ref 0.2–1.2)
Total Protein: 7.6 g/dL (ref 6.0–8.3)

## 2021-05-09 LAB — BASIC METABOLIC PANEL
BUN: 13 mg/dL (ref 6–23)
CO2: 28 mEq/L (ref 19–32)
Calcium: 9.7 mg/dL (ref 8.4–10.5)
Chloride: 104 mEq/L (ref 96–112)
Creatinine, Ser: 1.02 mg/dL (ref 0.40–1.50)
GFR: 75.49 mL/min (ref >60.00)
Glucose, Bld: 155 mg/dL — ABNORMAL HIGH (ref 70–99)
Potassium: 4.4 mEq/L (ref 3.5–5.1)
Sodium: 138 mEq/L (ref 135–145)

## 2021-05-09 LAB — URINALYSIS, ROUTINE W REFLEX MICROSCOPIC
Bilirubin Urine: NEGATIVE
Ketones, ur: NEGATIVE
Leukocytes,Ua: NEGATIVE
Nitrite: NEGATIVE
Specific Gravity, Urine: 1.025 (ref 1.000–1.030)
Total Protein, Urine: NEGATIVE
Urine Glucose: 100 — AB
Urobilinogen, UA: 1 (ref 0.0–1.0)
pH: 5.5 (ref 5.0–8.0)

## 2021-05-09 LAB — HEMOGLOBIN A1C: Hgb A1c MFr Bld: 9.1 % — ABNORMAL HIGH (ref 4.6–6.5)

## 2021-05-09 LAB — LIPID PANEL
Cholesterol: 216 mg/dL — ABNORMAL HIGH (ref 0–200)
HDL: 32.7 mg/dL — ABNORMAL LOW (ref >39.00)
LDL Cholesterol: 159 mg/dL — ABNORMAL HIGH (ref 0–99)
NonHDL: 183.04
Total CHOL/HDL Ratio: 7
Triglycerides: 122 mg/dL (ref 0.0–149.0)
VLDL: 24.4 mg/dL (ref 0.0–40.0)

## 2021-05-09 LAB — TSH: TSH: 1.49 u[IU]/mL (ref 0.35–4.50)

## 2021-05-09 LAB — PSA: PSA: 1.32 ng/mL (ref 0.10–4.00)

## 2021-05-09 LAB — VITAMIN D 25 HYDROXY (VIT D DEFICIENCY, FRACTURES): VITD: 20.87 ng/mL — ABNORMAL LOW (ref 30.00–100.00)

## 2021-05-09 NOTE — Assessment & Plan Note (Signed)
Lab Results  Component Value Date   HGBA1C 9.1 (H) 05/09/2021   Stable, pt to continue current medical treatment  - to f/u endo

## 2021-05-09 NOTE — Assessment & Plan Note (Signed)
Age and sex appropriate education and counseling updated with regular exercise and diet Referrals for preventative services - none needed Immunizations addressed - for Tdap Smoking counseling  - none needed Evidence for depression or other mood disorder - none significant Most recent labs reviewed. I have personally reviewed and have noted: 1) the patient's medical and social history 2) The patient's current medications and supplements 3) The patient's height, weight, and BMI have been recorded in the chart

## 2021-05-09 NOTE — Patient Instructions (Signed)
You had the Tap tetanus shot today  Please continue all other medications as before  Please have the pharmacy call with any other refills you may need.  Please continue your efforts at being more active, low cholesterol diet, and weight control.  You are otherwise up to date with prevention measures today.  Please keep your appointments with your specialists as you may have planned  Please make sure to follow up with Endocrinology Dr Loanne Drilling for sugar  Please go to the LAB at the blood drawing area for the tests to be done  You will be contacted by phone if any changes need to be made immediately.  Otherwise, you will receive a letter about your results with an explanation, but please check with MyChart first.  Please remember to sign up for MyChart if you have not done so, as this will be important to you in the future with finding out test results, communicating by private email, and scheduling acute appointments online when needed.  Please make an Appointment to return in 6 months, or sooner if needed
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
## 2021-05-09 NOTE — Assessment & Plan Note (Signed)
Last vitamin D Lab Results  Component Value Date   VD25OH 20.87 (L) 05/09/2021   Low to start oral replacement

## 2021-05-09 NOTE — Assessment & Plan Note (Addendum)
Cont  low chol diet, declines statin

## 2021-08-13 IMAGING — CT CT ENTEROGRAPHY (ABD-PELV W/ CM)
2 of 6 series · 15 of 46 positions shown, 17 images · IV contrast (OMNIPAQUE 300)
Comparison: 11/09/2020.

CLINICAL DATA: Abnormal CT 11/09/2020, jejunal mesenteric
stranding.

EXAM:
CT ABDOMEN AND PELVIS WITH CONTRAST (ENTEROGRAPHY)
TECHNIQUE: Multidetector CT of the abdomen and pelvis during bolus
administration of intravenous contrast. Negative oral contrast was
given.
CONTRAST:  100mL OMNIPAQUE IOHEXOL 300 MG/ML  SOLN

[Series 4: entero thins · axial · 0.75mm/px · z∈[-519,-41]mm · 12 of 269 slices shown, 14 images]
[im 15/269  soft-tissue]
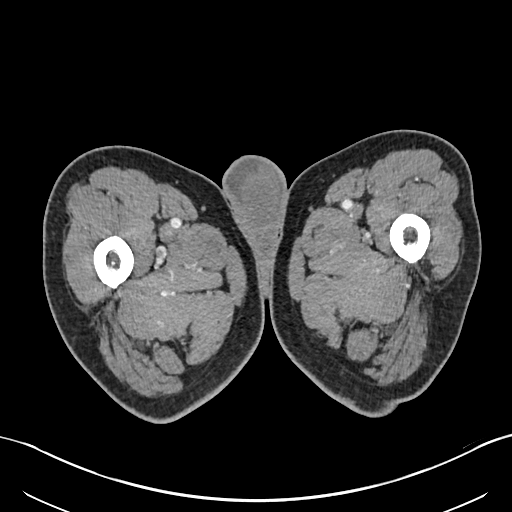
[im 15/269  bone]
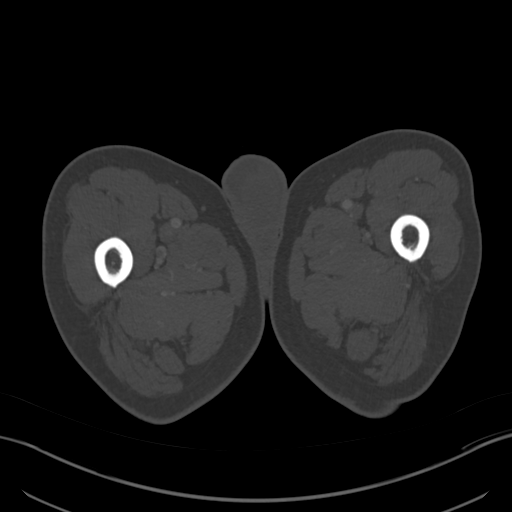
[im 43/269  soft-tissue]
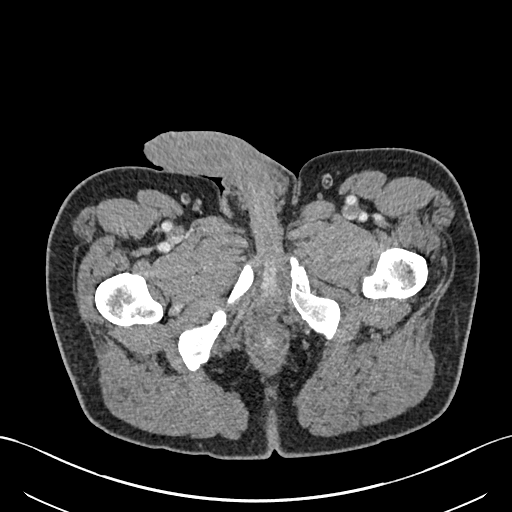
[im 57/269  soft-tissue]
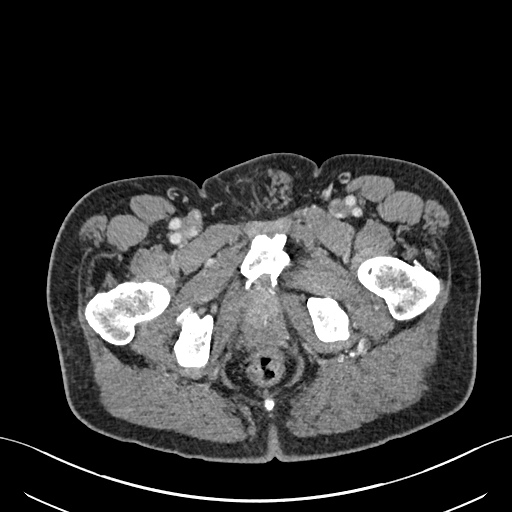
[im 85/269  soft-tissue]
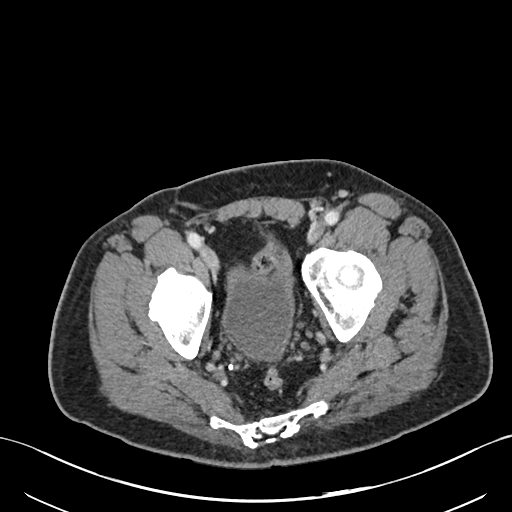
[im 99/269  soft-tissue]
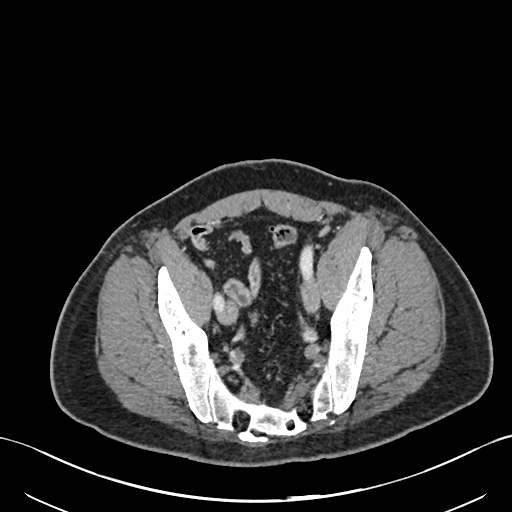
[im 127/269  soft-tissue]
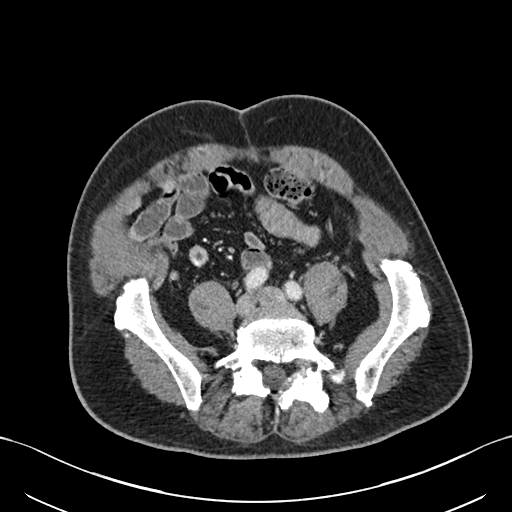
[im 142/269  soft-tissue]
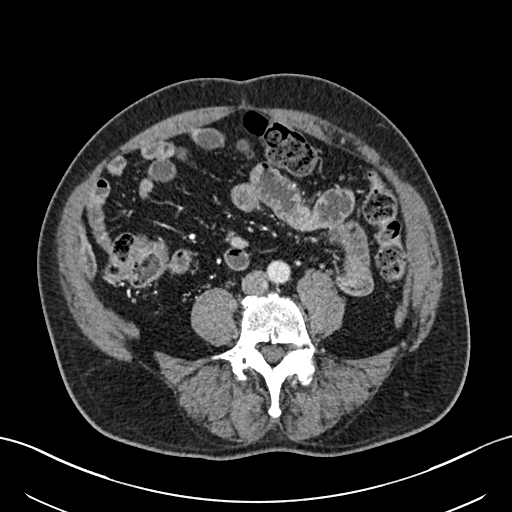
[im 170/269  soft-tissue]
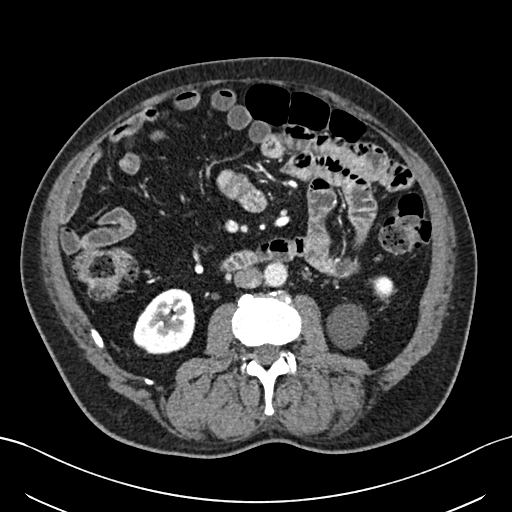
[im 184/269  soft-tissue]
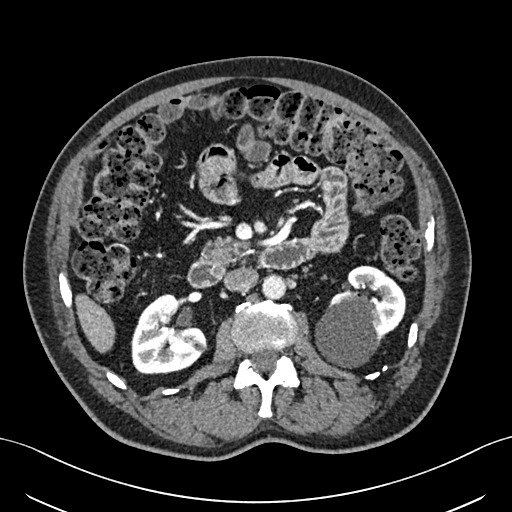
[im 184/269  bone]
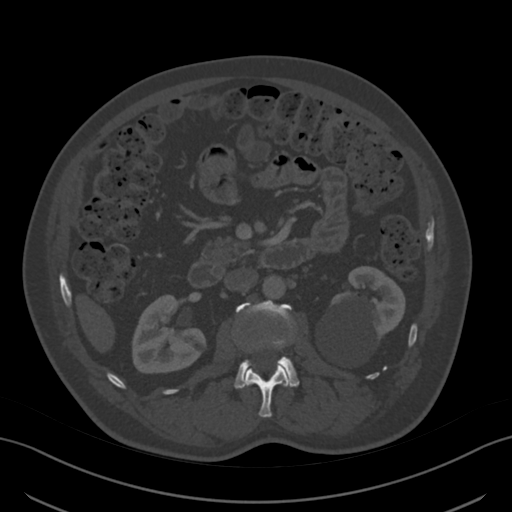
[im 212/269  soft-tissue]
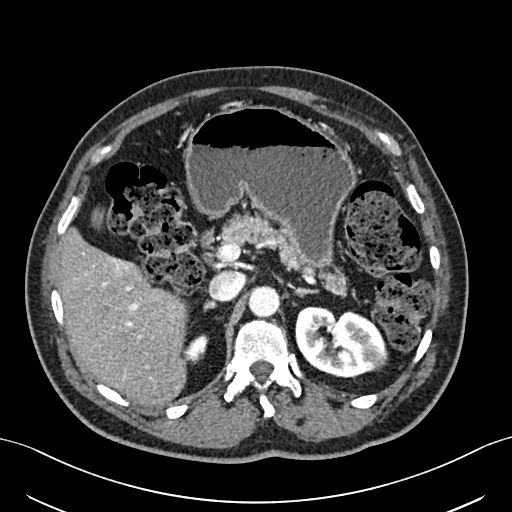
[im 226/269  soft-tissue]
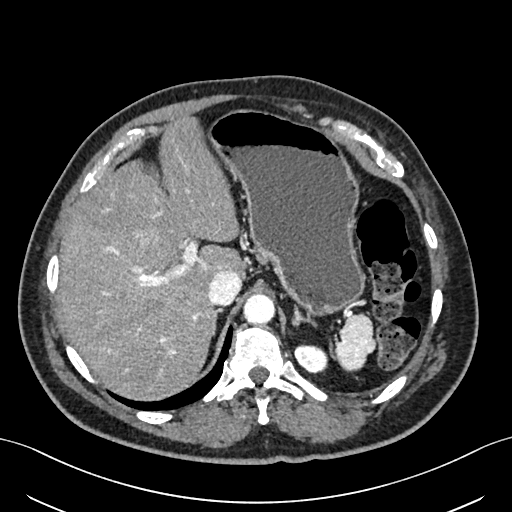
[im 254/269  soft-tissue]
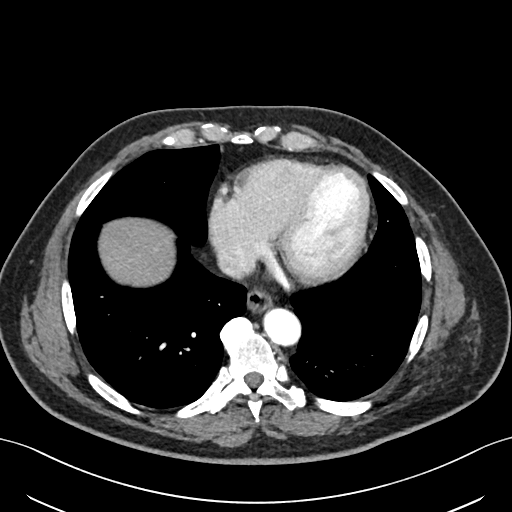

[Series 7: coronal · coronal · 0.71mm/px · 3 of 92 slices shown]
[im 31/92  soft-tissue]
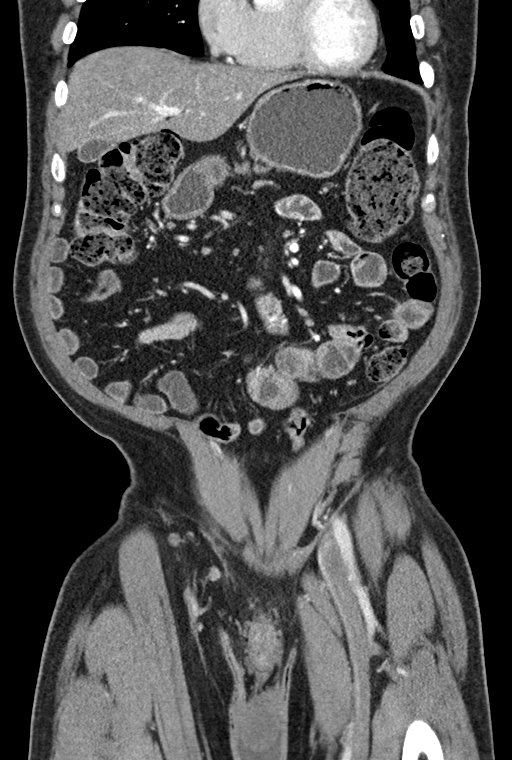
[im 41/92  soft-tissue]
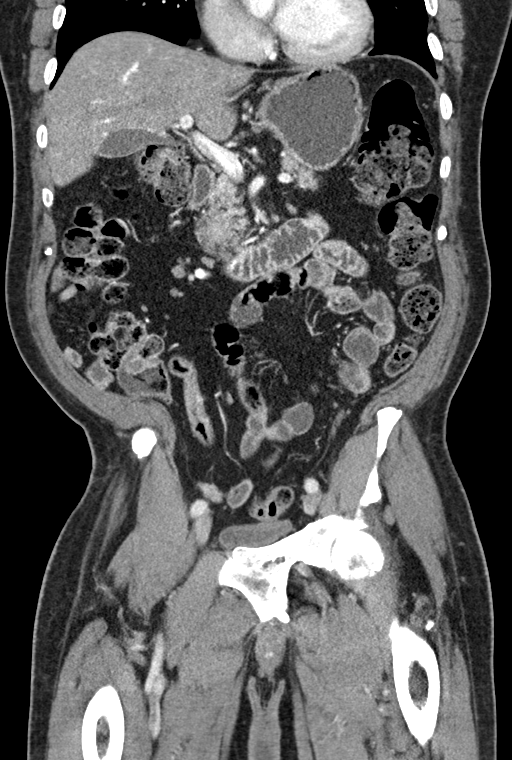
[im 51/92  soft-tissue]
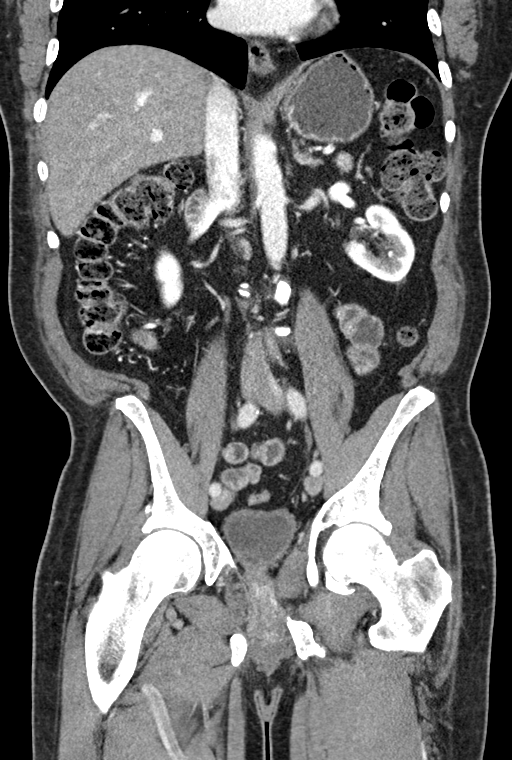

[15 of 46 positions shown; findings below may reference images not displayed]

FINDINGS: Lower chest: Lung bases are clear. Heart is at the upper limits of
normal in size to mildly enlarged. No pericardial or pleural
effusion. Distal esophagus is unremarkable.

Hepatobiliary: Liver is decreased in attenuation diffusely. Liver
and gallbladder are otherwise unremarkable. No biliary ductal
dilatation.

Pancreas: Negative.

Spleen: Atrophic.

Adrenals/Urinary Tract: Adrenal glands are unremarkable.
Subcentimeter low-attenuation lesions in the kidneys are too small
to characterize but cysts are likely. Larger low-attenuation lesions
in the left kidney measuring up to 5.1 cm are consistent with cysts.
Kidneys are otherwise unremarkable. Duplicated left ureter. Ureters
are decompressed. Bladder is grossly unremarkable.

Stomach/Bowel: Stomach, small bowel and appendix are unremarkable.
Stool is seen in the majority of the colon, indicative of
constipation.

Vascular/Lymphatic: Vascular structures are unremarkable. No
pathologically enlarged lymph nodes.

Reproductive: Prostate is visualized.  Scrotal hydroceles.

Other: No free fluid.  Mesenteries and peritoneum are unremarkable.

Musculoskeletal: Old fractures involving the symphysis pubis and
bilateral superior and inferior pubic rami with associated
deformity. Probable bone island in the medial left iliac wing.
Degenerative changes in the spine.
IMPRESSION: 1. No acute findings. Jejunal mesenteric haziness has resolved in
the interval.
2. Hepatic steatosis.

## 2021-10-25 ENCOUNTER — Other Ambulatory Visit: Payer: Self-pay | Admitting: Internal Medicine

## 2021-10-25 NOTE — Telephone Encounter (Signed)
Please refill as per office routine med refill policy (all routine meds to be refilled for 3 mo or monthly (per pt preference) up to one year from last visit, then month to month grace period for 3 mo, then further med refills will have to be denied) ? ?

## 2021-11-08 ENCOUNTER — Encounter: Payer: Self-pay | Admitting: Internal Medicine

## 2021-11-08 ENCOUNTER — Ambulatory Visit (INDEPENDENT_AMBULATORY_CARE_PROVIDER_SITE_OTHER): Payer: PPO | Admitting: Internal Medicine

## 2021-11-08 ENCOUNTER — Other Ambulatory Visit: Payer: Self-pay

## 2021-11-08 VITALS — BP 106/74 | HR 52 | Temp 98.6°F | Ht 71.0 in | Wt 195.0 lb

## 2021-11-08 DIAGNOSIS — E7800 Pure hypercholesterolemia, unspecified: Secondary | ICD-10-CM

## 2021-11-08 DIAGNOSIS — E559 Vitamin D deficiency, unspecified: Secondary | ICD-10-CM | POA: Diagnosis not present

## 2021-11-08 DIAGNOSIS — E1165 Type 2 diabetes mellitus with hyperglycemia: Secondary | ICD-10-CM | POA: Diagnosis not present

## 2021-11-08 DIAGNOSIS — I10 Essential (primary) hypertension: Secondary | ICD-10-CM

## 2021-11-08 DIAGNOSIS — E538 Deficiency of other specified B group vitamins: Secondary | ICD-10-CM | POA: Diagnosis not present

## 2021-11-08 MED ORDER — CHOLECALCIFEROL 50 MCG (2000 UT) PO TABS: ORAL_TABLET | ORAL | 99 refills | Status: DC

## 2021-11-08 MED ORDER — ATORVASTATIN CALCIUM 20 MG PO TABS: 20.0000 mg | ORAL_TABLET | Freq: Every day | ORAL | 3 refills | Status: DC

## 2021-11-08 MED ORDER — AMLODIPINE BESY-BENAZEPRIL HCL 10-20 MG PO CAPS: ORAL_CAPSULE | ORAL | 3 refills | Status: DC

## 2021-11-08 MED ORDER — GLIPIZIDE ER 10 MG PO TB24: 10.0000 mg | ORAL_TABLET | Freq: Every morning | ORAL | 3 refills | Status: DC

## 2021-11-08 MED ORDER — RYBELSUS 3 MG PO TABS: 3.0000 mg | ORAL_TABLET | Freq: Every day | ORAL | 3 refills | Status: DC

## 2021-11-08 NOTE — Patient Instructions (Addendum)
Please take all new medication as prescribed - the lipitor for cholesterol, and the rybelsus for sugar  Please continue all other medications as before, and refills have been done if requested - the glipizide for sugar, and lotrel for blood pressure  Please have the pharmacy call with any other refills you may need.  Please continue your efforts at being more active, low cholesterol diet, and weight control.  Please keep your appointments with your specialists as you may have planned  Please make an Appointment to return in 6 months, or sooner if needed, also with Lab Appointment for testing done 3-5 days before at the [facility] (so this is for TWO appointments - please see the scheduling desk as you leave)  Due to the ongoing Covid 19 pandemic, our lab now requires an appointment for any labs done at our office.  If you need labs done and do not have an appointment, please call our office ahead of time to schedule before presenting to the lab for your testing.
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
## 2021-11-08 NOTE — Assessment & Plan Note (Addendum)
Lab Results  Component Value Date   HGBA1C 9.1 (H) 05/09/2021   Uncontrolled,  pt to continue current medical treatment glipizide and add rybelsus, DM diet

## 2021-11-08 NOTE — Assessment & Plan Note (Signed)
Lab Results  Component Value Date   LDLCALC 159 (H) 05/09/2021   Uncontrolled, goal ldl < 70, pt to start statin lipitor 20

## 2021-11-08 NOTE — Assessment & Plan Note (Signed)
BP Readings from Last 3 Encounters:  11/08/21 106/74  05/09/21 134/88  02/06/21 128/84   Stable, pt to continue medical treatment lotrel

## 2021-11-08 NOTE — Assessment & Plan Note (Signed)
Last vitamin D Lab Results  Component Value Date   VD25OH 20.87 (L) 05/09/2021   Low, to start oral replacement

## 2022-01-08 ENCOUNTER — Other Ambulatory Visit: Payer: Self-pay

## 2022-01-08 ENCOUNTER — Ambulatory Visit (INDEPENDENT_AMBULATORY_CARE_PROVIDER_SITE_OTHER): Payer: Medicare Other | Admitting: Internal Medicine

## 2022-01-08 VITALS — BP 124/76 | HR 74 | Temp 98.3°F | Ht 70.5 in | Wt 196.4 lb

## 2022-01-08 DIAGNOSIS — E1165 Type 2 diabetes mellitus with hyperglycemia: Secondary | ICD-10-CM | POA: Diagnosis not present

## 2022-01-08 DIAGNOSIS — E538 Deficiency of other specified B group vitamins: Secondary | ICD-10-CM | POA: Diagnosis not present

## 2022-01-08 DIAGNOSIS — E7800 Pure hypercholesterolemia, unspecified: Secondary | ICD-10-CM

## 2022-01-08 DIAGNOSIS — E559 Vitamin D deficiency, unspecified: Secondary | ICD-10-CM

## 2022-01-08 DIAGNOSIS — R21 Rash and other nonspecific skin eruption: Secondary | ICD-10-CM

## 2022-01-08 DIAGNOSIS — I10 Essential (primary) hypertension: Secondary | ICD-10-CM | POA: Diagnosis not present

## 2022-01-08 DIAGNOSIS — Z0001 Encounter for general adult medical examination with abnormal findings: Secondary | ICD-10-CM

## 2022-01-08 LAB — URINALYSIS, ROUTINE W REFLEX MICROSCOPIC
Bilirubin Urine: NEGATIVE
Hgb urine dipstick: NEGATIVE
Ketones, ur: NEGATIVE
Leukocytes,Ua: NEGATIVE
Nitrite: NEGATIVE
RBC / HPF: NONE SEEN (ref 0–?)
Specific Gravity, Urine: >=1.03 — AB (ref 1.000–1.030)
Total Protein, Urine: NEGATIVE
Urine Glucose: NEGATIVE
Urobilinogen, UA: 1 (ref 0.0–1.0)
pH: 5.5 (ref 5.0–8.0)

## 2022-01-08 LAB — CBC WITH DIFFERENTIAL/PLATELET
Basophils Absolute: 0 10*3/uL (ref 0.0–0.1)
Basophils Relative: 0.3 % (ref 0.0–3.0)
Eosinophils Absolute: 0.2 10*3/uL (ref 0.0–0.7)
Eosinophils Relative: 1.7 % (ref 0.0–5.0)
HCT: 42.8 % (ref 39.0–52.0)
Hemoglobin: 14.5 g/dL (ref 13.0–17.0)
Lymphocytes Relative: 46 % (ref 12.0–46.0)
Lymphs Abs: 4.3 10*3/uL — ABNORMAL HIGH (ref 0.7–4.0)
MCHC: 33.8 g/dL (ref 30.0–36.0)
MCV: 93.1 fl (ref 78.0–100.0)
Monocytes Absolute: 0.8 10*3/uL (ref 0.1–1.0)
Monocytes Relative: 8.8 % (ref 3.0–12.0)
Neutro Abs: 4 10*3/uL (ref 1.4–7.7)
Neutrophils Relative %: 43.2 % (ref 43.0–77.0)
Platelets: 318 10*3/uL (ref 150.0–400.0)
RBC: 4.6 Mil/uL (ref 4.22–5.81)
RDW: 13.8 % (ref 11.5–15.5)
WBC: 9.3 10*3/uL (ref 4.0–10.5)

## 2022-01-08 LAB — VITAMIN B12: Vitamin B-12: 461 pg/mL (ref 211–911)

## 2022-01-08 LAB — PSA: PSA: 1.29 ng/mL (ref 0.10–4.00)

## 2022-01-08 LAB — MICROALBUMIN / CREATININE URINE RATIO
Creatinine,U: 238.9 mg/dL
Microalb Creat Ratio: 1.1 mg/g (ref 0.0–30.0)
Microalb, Ur: 2.5 mg/dL — ABNORMAL HIGH (ref 0.0–1.9)

## 2022-01-08 LAB — VITAMIN D 25 HYDROXY (VIT D DEFICIENCY, FRACTURES): VITD: 25.76 ng/mL — ABNORMAL LOW (ref 30.00–100.00)

## 2022-01-08 LAB — TSH: TSH: 2.32 u[IU]/mL (ref 0.35–5.50)

## 2022-01-08 LAB — HEMOGLOBIN A1C: Hgb A1c MFr Bld: 8.8 % — ABNORMAL HIGH (ref 4.6–6.5)

## 2022-01-08 MED ORDER — CLOTRIMAZOLE-BETAMETHASONE 1-0.05 % EX CREA: 1.0000 "application " | TOPICAL_CREAM | Freq: Every day | CUTANEOUS | 1 refills | Status: DC

## 2022-01-08 MED ORDER — AMLODIPINE BESY-BENAZEPRIL HCL 10-20 MG PO CAPS: ORAL_CAPSULE | ORAL | 3 refills | Status: DC

## 2022-01-08 MED ORDER — ATORVASTATIN CALCIUM 20 MG PO TABS: 20.0000 mg | ORAL_TABLET | Freq: Every day | ORAL | 3 refills | Status: DC

## 2022-01-08 MED ORDER — GLIPIZIDE ER 10 MG PO TB24: 10.0000 mg | ORAL_TABLET | Freq: Every morning | ORAL | 3 refills | Status: DC

## 2022-01-08 NOTE — Patient Instructions (Addendum)
Please take all new medication as prescribed - the cream for the rash on the left thigh  Please continue all other medications as before, and refills have been done if requested.  Please have the pharmacy call with any other refills you may need.  Please continue your efforts at being more active, low cholesterol diet, and weight control.  You are otherwise up to date with prevention measures today.  Please keep your appointments with your specialists as you may have planned  Please go to the LAB at the blood drawing area for the tests to be done  You will be contacted by phone if any changes need to be made immediately.  Otherwise, you will receive a letter about your results with an explanation, but please check with MyChart first.  Please remember to sign up for MyChart if you have not done so, as this will be important to you in the future with finding out test results, communicating by private email, and scheduling acute appointments online when needed.  Please make an Appointment to return in June 20 as you have planned, or sooner if needed
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
## 2022-01-09 ENCOUNTER — Other Ambulatory Visit: Payer: Self-pay | Admitting: Internal Medicine

## 2022-01-09 LAB — LIPID PANEL
Cholesterol: 149 mg/dL (ref 0–200)
HDL: 29.2 mg/dL — ABNORMAL LOW (ref >39.00)
LDL Cholesterol: 89 mg/dL (ref 0–99)
NonHDL: 119.64
Total CHOL/HDL Ratio: 5
Triglycerides: 152 mg/dL — ABNORMAL HIGH (ref 0.0–149.0)
VLDL: 30.4 mg/dL (ref 0.0–40.0)

## 2022-01-09 LAB — BASIC METABOLIC PANEL
BUN: 11 mg/dL (ref 6–23)
CO2: 28 mEq/L (ref 19–32)
Calcium: 9.4 mg/dL (ref 8.4–10.5)
Chloride: 106 mEq/L (ref 96–112)
Creatinine, Ser: 0.96 mg/dL (ref 0.40–1.50)
GFR: 80.8 mL/min (ref >60.00)
Glucose, Bld: 151 mg/dL — ABNORMAL HIGH (ref 70–99)
Potassium: 4 mEq/L (ref 3.5–5.1)
Sodium: 141 mEq/L (ref 135–145)

## 2022-01-09 LAB — HEPATIC FUNCTION PANEL
ALT: 19 U/L (ref 0–53)
AST: 18 U/L (ref 0–37)
Albumin: 4.3 g/dL (ref 3.5–5.2)
Alkaline Phosphatase: 110 U/L (ref 39–117)
Bilirubin, Direct: 0.1 mg/dL (ref 0.0–0.3)
Total Bilirubin: 0.7 mg/dL (ref 0.2–1.2)
Total Protein: 7.4 g/dL (ref 6.0–8.3)

## 2022-01-09 MED ORDER — PIOGLITAZONE HCL 15 MG PO TABS: 15.0000 mg | ORAL_TABLET | Freq: Every day | ORAL | 3 refills | Status: DC

## 2022-01-09 MED ORDER — CHOLECALCIFEROL 50 MCG (2000 UT) PO TABS: ORAL_TABLET | ORAL | 99 refills | Status: DC

## 2022-01-13 ENCOUNTER — Encounter: Payer: Self-pay | Admitting: Internal Medicine

## 2022-01-13 DIAGNOSIS — R21 Rash and other nonspecific skin eruption: Secondary | ICD-10-CM | POA: Insufficient documentation

## 2022-01-13 NOTE — Assessment & Plan Note (Signed)
To left mid upper leg, c/w ringworm/fungal - for lotrisone asd prn

## 2022-01-13 NOTE — Assessment & Plan Note (Signed)
Age and sex appropriate education and counseling updated with regular exercise and diet Referrals for preventative services - none needed Immunizations addressed - declines covid booster, flu shot, shingrix Smoking counseling  - none needed Evidence for depression or other mood disorder - none significant Most recent labs reviewed. I have personally reviewed and have noted: 1) the patient's medical and social history 2) The patient's current medications and supplements 3) The patient's height, weight, and BMI have been recorded in the chart  

## 2022-01-13 NOTE — Assessment & Plan Note (Signed)
Lab Results  Component Value Date   LDLCALC 89 01/08/2022   Uncontrolled, goal ldl < 70, pt to restart lipitor 20

## 2022-01-13 NOTE — Assessment & Plan Note (Signed)
With diet and med non compliance uncontrolled, today for a1c with labs, and consider different OHA

## 2022-01-13 NOTE — Assessment & Plan Note (Signed)
Last vitamin D Lab Results  Component Value Date   VD25OH 25.76 (L) 01/08/2022   Low, to start oral replacement

## 2022-01-13 NOTE — Assessment & Plan Note (Signed)
BP Readings from Last 3 Encounters:  01/08/22 124/76  11/08/21 106/74  05/09/21 134/88   Stable, pt to continue medical treatment lotrel

## 2022-01-29 ENCOUNTER — Telehealth: Payer: Self-pay | Admitting: Internal Medicine

## 2022-01-29 NOTE — Telephone Encounter (Signed)
Left message for patient to call back to schedule Medicare Annual Wellness Visit  ? ?No hx of AWV eligible as of 05/27/19 ? ?Please schedule at anytime with [facility] if patient calls the office back.   ? ?40 Minutes appointment  ? ?Any questions, please call me at [phone]  ?

## 2022-04-11 ENCOUNTER — Telehealth: Payer: Self-pay | Admitting: Internal Medicine

## 2022-04-11 DIAGNOSIS — R300 Dysuria: Secondary | ICD-10-CM

## 2022-04-11 NOTE — Telephone Encounter (Signed)
Ok I have entered orders ?

## 2022-04-11 NOTE — Telephone Encounter (Signed)
Patient notified

## 2022-04-11 NOTE — Telephone Encounter (Signed)
PT calls today in regards to emerging symptoms. PT believes he is dealing with a UTI and was wanting to know if an order could be put in for urinalysis. PT states they have had these symptoms before and has been diagnosed with a UTI before and believes he is dealing with the same thing.  ? ?CB: [phone] ?

## 2022-04-12 ENCOUNTER — Other Ambulatory Visit (INDEPENDENT_AMBULATORY_CARE_PROVIDER_SITE_OTHER): Payer: Medicare Other

## 2022-04-12 DIAGNOSIS — R300 Dysuria: Secondary | ICD-10-CM

## 2022-04-12 LAB — URINALYSIS, ROUTINE W REFLEX MICROSCOPIC
Bilirubin Urine: NEGATIVE
Hgb urine dipstick: NEGATIVE
Ketones, ur: NEGATIVE
Leukocytes,Ua: NEGATIVE
Nitrite: NEGATIVE
RBC / HPF: NONE SEEN (ref 0–?)
Specific Gravity, Urine: 1.025 (ref 1.000–1.030)
Total Protein, Urine: NEGATIVE
Urine Glucose: >=1000 — AB
Urobilinogen, UA: 2 — AB (ref 0.0–1.0)
pH: 6 (ref 5.0–8.0)

## 2022-04-13 LAB — URINE CULTURE
MICRO NUMBER:: 13414415
SPECIMEN QUALITY:: ADEQUATE

## 2022-04-25 ENCOUNTER — Telehealth: Payer: Self-pay | Admitting: Internal Medicine

## 2022-04-25 NOTE — Telephone Encounter (Signed)
LVM for pt to rtn my call to schedule AWV with [facility] call back # [phone] 

## 2022-05-14 ENCOUNTER — Ambulatory Visit (INDEPENDENT_AMBULATORY_CARE_PROVIDER_SITE_OTHER): Payer: Medicare Other

## 2022-05-14 DIAGNOSIS — Z0000 Encounter for general adult medical examination without abnormal findings: Secondary | ICD-10-CM

## 2022-05-14 NOTE — Patient Instructions (Signed)
[name] , Thank you for taking time to come for your Medicare Wellness Visit. I appreciate your ongoing commitment to your health goals. Please review the following plan we discussed and let me know if I can assist you in the future.   Screening recommendations/referrals: Colonoscopy: 09/10/2018 Recommended yearly ophthalmology/optometry visit for glaucoma screening and checkup Recommended yearly dental visit for hygiene and checkup  Vaccinations: Influenza vaccine: completed  Pneumococcal vaccine: completed  Tdap vaccine: 05/09/2021 Shingles vaccine: will consider     Advanced directives: none   Conditions/risks identified: none   Next appointment: none   Preventive Care 70 Years and Older, Male Preventive care refers to lifestyle choices and visits with your health care provider that can promote health and wellness. What does preventive care include? A yearly physical exam. This is also called an annual well check. Dental exams once or twice a year. Routine eye exams. Ask your health care provider how often you should have your eyes checked. Personal lifestyle choices, including: Daily care of your teeth and gums. Regular physical activity. Eating a healthy diet. Avoiding tobacco and drug use. Limiting alcohol use. Practicing safe sex. Taking low doses of aspirin every day. Taking vitamin and mineral supplements as recommended by your health care provider. What happens during an annual well check? The services and screenings done by your health care provider during your annual well check will depend on your age, overall health, lifestyle risk factors, and family history of disease. Counseling  Your health care provider may ask you questions about your: Alcohol use. Tobacco use. Drug use. Emotional well-being. Home and relationship well-being. Sexual activity. Eating habits. History of falls. Memory and ability to understand (cognition). Work and work  Statistician. Screening  You may have the following tests or measurements: Height, weight, and BMI. Blood pressure. Lipid and cholesterol levels. These may be checked every 5 years, or more frequently if you are over 4 years old. Skin check. Lung cancer screening. You may have this screening every year starting at age 70 if you have a 30-pack-year history of smoking and currently smoke or have quit within the past 15 years. Fecal occult blood test (FOBT) of the stool. You may have this test every year starting at age 70. Flexible sigmoidoscopy or colonoscopy. You may have a sigmoidoscopy every 5 years or a colonoscopy every 10 years starting at age 70. Prostate cancer screening. Recommendations will vary depending on your family history and other risks. Hepatitis C blood test. Hepatitis B blood test. Sexually transmitted disease (STD) testing. Diabetes screening. This is done by checking your blood sugar (glucose) after you have not eaten for a while (fasting). You may have this done every 1-3 years. Abdominal aortic aneurysm (AAA) screening. You may need this if you are a current or former smoker. Osteoporosis. You may be screened starting at age 70 if you are at high risk. Talk with your health care provider about your test results, treatment options, and if necessary, the need for more tests. Vaccines  Your health care provider may recommend certain vaccines, such as: Influenza vaccine. This is recommended every year. Tetanus, diphtheria, and acellular pertussis (Tdap, Td) vaccine. You may need a Td booster every 10 years. Zoster vaccine. You may need this after age 70. Pneumococcal 13-valent conjugate (PCV13) vaccine. One dose is recommended after age 70. Pneumococcal polysaccharide (PPSV23) vaccine. One dose is recommended after age 70. Talk to your health care provider about which screenings and vaccines you need and how often you  need them. This information is not intended to replace  advice given to you by your health care provider. Make sure you discuss any questions you have with your health care provider. Document Released: 12/09/2015 Document Revised: 08/01/2016 Document Reviewed: 09/13/2015 Elsevier Interactive Patient Education  2017 [employer] Prevention in the Home Falls can cause injuries. They can happen to people of all ages. There are many things you can do to make your home safe and to help prevent falls. What can I do on the outside of my home? Regularly fix the edges of walkways and driveways and fix any cracks. Remove anything that might make you trip as you walk through a door, such as a raised step or threshold. Trim any bushes or trees on the path to your home. Use bright outdoor lighting. Clear any walking paths of anything that might make someone trip, such as rocks or tools. Regularly check to see if handrails are loose or broken. Make sure that both sides of any steps have handrails. Any raised decks and porches should have guardrails on the edges. Have any leaves, snow, or ice cleared regularly. Use sand or salt on walking paths during winter. Clean up any spills in your garage right away. This includes oil or grease spills. What can I do in the bathroom? Use night lights. Install grab bars by the toilet and in the tub and shower. Do not use towel bars as grab bars. Use non-skid mats or decals in the tub or shower. If you need to sit down in the shower, use a plastic, non-slip stool. Keep the floor dry. Clean up any water that spills on the floor as soon as it happens. Remove soap buildup in the tub or shower regularly. Attach bath mats securely with double-sided non-slip rug tape. Do not have throw rugs and other things on the floor that can make you trip. What can I do in the bedroom? Use night lights. Make sure that you have a light by your bed that is easy to reach. Do not use any sheets or blankets that are too big for your bed.  They should not hang down onto the floor. Have a firm chair that has side arms. You can use this for support while you get dressed. Do not have throw rugs and other things on the floor that can make you trip. What can I do in the kitchen? Clean up any spills right away. Avoid walking on wet floors. Keep items that you use a lot in easy-to-reach places. If you need to reach something above you, use a strong step stool that has a grab bar. Keep electrical cords out of the way. Do not use floor polish or wax that makes floors slippery. If you must use wax, use non-skid floor wax. Do not have throw rugs and other things on the floor that can make you trip. What can I do with my stairs? Do not leave any items on the stairs. Make sure that there are handrails on both sides of the stairs and use them. Fix handrails that are broken or loose. Make sure that handrails are as long as the stairways. Check any carpeting to make sure that it is firmly attached to the stairs. Fix any carpet that is loose or worn. Avoid having throw rugs at the top or bottom of the stairs. If you do have throw rugs, attach them to the floor with carpet tape. Make sure that you have a light switch  at the top of the stairs and the bottom of the stairs. If you do not have them, ask someone to add them for you. What else can I do to help prevent falls? Wear shoes that: Do not have high heels. Have rubber bottoms. Are comfortable and fit you well. Are closed at the toe. Do not wear sandals. If you use a stepladder: Make sure that it is fully opened. Do not climb a closed stepladder. Make sure that both sides of the stepladder are locked into place. Ask someone to hold it for you, if possible. Clearly mark and make sure that you can see: Any grab bars or handrails. First and last steps. Where the edge of each step is. Use tools that help you move around (mobility aids) if they are needed. These  include: Canes. Walkers. Scooters. Crutches. Turn on the lights when you go into a dark area. Replace any light bulbs as soon as they burn out. Set up your furniture so you have a clear path. Avoid moving your furniture around. If any of your floors are uneven, fix them. If there are any pets around you, be aware of where they are. Review your medicines with your doctor. Some medicines can make you feel dizzy. This can increase your chance of falling. Ask your doctor what other things that you can do to help prevent falls. This information is not intended to replace advice given to you by your health care provider. Make sure you discuss any questions you have with your health care provider. Document Released: 09/08/2009 Document Revised: 04/19/2016 Document Reviewed: 12/17/2014 Elsevier Interactive Patient Education  2017 [employer].
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
## 2022-05-15 ENCOUNTER — Other Ambulatory Visit: Payer: Self-pay | Admitting: Internal Medicine

## 2022-05-15 ENCOUNTER — Ambulatory Visit (INDEPENDENT_AMBULATORY_CARE_PROVIDER_SITE_OTHER): Payer: PPO | Admitting: Internal Medicine

## 2022-05-15 ENCOUNTER — Encounter: Payer: Self-pay | Admitting: Internal Medicine

## 2022-05-15 VITALS — BP 130/76 | HR 55 | Temp 97.7°F | Ht 70.5 in | Wt 195.0 lb

## 2022-05-15 DIAGNOSIS — R109 Unspecified abdominal pain: Secondary | ICD-10-CM

## 2022-05-15 DIAGNOSIS — E559 Vitamin D deficiency, unspecified: Secondary | ICD-10-CM

## 2022-05-15 DIAGNOSIS — E1165 Type 2 diabetes mellitus with hyperglycemia: Secondary | ICD-10-CM

## 2022-05-15 DIAGNOSIS — E7800 Pure hypercholesterolemia, unspecified: Secondary | ICD-10-CM

## 2022-05-15 DIAGNOSIS — R1011 Right upper quadrant pain: Secondary | ICD-10-CM

## 2022-05-15 DIAGNOSIS — K5900 Constipation, unspecified: Secondary | ICD-10-CM

## 2022-05-15 DIAGNOSIS — I10 Essential (primary) hypertension: Secondary | ICD-10-CM

## 2022-05-15 LAB — LIPID PANEL
Cholesterol: 166 mg/dL (ref 0–200)
HDL: 32.9 mg/dL — ABNORMAL LOW (ref >39.00)
LDL Cholesterol: 108 mg/dL — ABNORMAL HIGH (ref 0–99)
NonHDL: 133.58
Total CHOL/HDL Ratio: 5
Triglycerides: 128 mg/dL (ref 0.0–149.0)
VLDL: 25.6 mg/dL (ref 0.0–40.0)

## 2022-05-15 LAB — CBC WITH DIFFERENTIAL/PLATELET
Basophils Absolute: 0.1 10*3/uL (ref 0.0–0.1)
Basophils Relative: 0.5 % (ref 0.0–3.0)
Eosinophils Absolute: 0.2 10*3/uL (ref 0.0–0.7)
Eosinophils Relative: 2 % (ref 0.0–5.0)
HCT: 43.4 % (ref 39.0–52.0)
Hemoglobin: 14.5 g/dL (ref 13.0–17.0)
Lymphocytes Relative: 34.7 % (ref 12.0–46.0)
Lymphs Abs: 4 10*3/uL (ref 0.7–4.0)
MCHC: 33.5 g/dL (ref 30.0–36.0)
MCV: 94.4 fl (ref 78.0–100.0)
Monocytes Absolute: 1.1 10*3/uL — ABNORMAL HIGH (ref 0.1–1.0)
Monocytes Relative: 9.5 % (ref 3.0–12.0)
Neutro Abs: 6.1 10*3/uL (ref 1.4–7.7)
Neutrophils Relative %: 53.3 % (ref 43.0–77.0)
Platelets: 300 10*3/uL (ref 150.0–400.0)
RBC: 4.59 Mil/uL (ref 4.22–5.81)
RDW: 13.7 % (ref 11.5–15.5)
WBC: 11.5 10*3/uL — ABNORMAL HIGH (ref 4.0–10.5)

## 2022-05-15 LAB — URINALYSIS, ROUTINE W REFLEX MICROSCOPIC
Bilirubin Urine: NEGATIVE
Hgb urine dipstick: NEGATIVE
Ketones, ur: NEGATIVE
Leukocytes,Ua: NEGATIVE
Nitrite: NEGATIVE
RBC / HPF: NONE SEEN (ref 0–?)
Specific Gravity, Urine: 1.025 (ref 1.000–1.030)
Total Protein, Urine: NEGATIVE
Urine Glucose: NEGATIVE
Urobilinogen, UA: 1 (ref 0.0–1.0)
pH: 6 (ref 5.0–8.0)

## 2022-05-15 LAB — BASIC METABOLIC PANEL
BUN: 10 mg/dL (ref 6–23)
CO2: 28 mEq/L (ref 19–32)
Calcium: 9.6 mg/dL (ref 8.4–10.5)
Chloride: 103 mEq/L (ref 96–112)
Creatinine, Ser: 0.9 mg/dL (ref 0.40–1.50)
GFR: 87.1 mL/min (ref >60.00)
Glucose, Bld: 158 mg/dL — ABNORMAL HIGH (ref 70–99)
Potassium: 3.9 mEq/L (ref 3.5–5.1)
Sodium: 138 mEq/L (ref 135–145)

## 2022-05-15 LAB — HEPATIC FUNCTION PANEL
ALT: 19 U/L (ref 0–53)
AST: 18 U/L (ref 0–37)
Albumin: 4.2 g/dL (ref 3.5–5.2)
Alkaline Phosphatase: 122 U/L — ABNORMAL HIGH (ref 39–117)
Bilirubin, Direct: 0.2 mg/dL (ref 0.0–0.3)
Total Bilirubin: 1.1 mg/dL (ref 0.2–1.2)
Total Protein: 7.7 g/dL (ref 6.0–8.3)

## 2022-05-15 LAB — HEMOGLOBIN A1C: Hgb A1c MFr Bld: 10.1 % — ABNORMAL HIGH (ref 4.6–6.5)

## 2022-05-15 MED ORDER — PIOGLITAZONE HCL 45 MG PO TABS: 45.0000 mg | ORAL_TABLET | Freq: Every day | ORAL | 3 refills | Status: DC

## 2022-05-15 MED ORDER — ATORVASTATIN CALCIUM 40 MG PO TABS: 40.0000 mg | ORAL_TABLET | Freq: Every day | ORAL | 3 refills | Status: DC

## 2022-05-15 NOTE — Patient Instructions (Addendum)
Ok to try the OTC Miralax for constipation   Please continue all other medications as before, and refills have been done if requested.  Please have the pharmacy call with any other refills you may need.  Please continue your efforts at being more active, low cholesterol diet, and weight control.  You are otherwise up to date with prevention measures today.  Please keep your appointments with your specialists as you may have planned  You will be contacted regarding the referral for: CT scan  Please go to the LAB at the blood drawing area for the tests to be done  You will be contacted by phone if any changes need to be made immediately.  Otherwise, you will receive a letter about your results with an explanation, but please check with MyChart first.  Please remember to sign up for MyChart if you have not done so, as this will be important to you in the future with finding out test results, communicating by private email, and scheduling acute appointments online when needed.  Please make an Appointment to return in 6 months, or sooner if needed

## 2022-05-15 NOTE — Assessment & Plan Note (Signed)
Last vitamin D Lab Results  Component Value Date   VD25OH 25.76 (L) 01/08/2022   Low, to start oral replacement
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
## 2022-05-16 ENCOUNTER — Ambulatory Visit
Admission: RE | Admit: 2022-05-16 | Discharge: 2022-05-16 | Disposition: A | Discharge status: Home / Self Care | Payer: Medicare Other | Source: Ambulatory Visit | Attending: Internal Medicine | Admitting: Internal Medicine

## 2022-05-16 DIAGNOSIS — R109 Unspecified abdominal pain: Secondary | ICD-10-CM | POA: Diagnosis not present

## 2022-05-16 DIAGNOSIS — S3282XA Multiple fractures of pelvis without disruption of pelvic ring, initial encounter for closed fracture: Secondary | ICD-10-CM | POA: Diagnosis not present

## 2022-05-16 DIAGNOSIS — N3289 Other specified disorders of bladder: Secondary | ICD-10-CM | POA: Diagnosis not present

## 2022-05-16 DIAGNOSIS — R1011 Right upper quadrant pain: Secondary | ICD-10-CM

## 2022-05-20 ENCOUNTER — Encounter: Payer: Self-pay | Admitting: Internal Medicine

## 2022-05-20 DIAGNOSIS — K5900 Constipation, unspecified: Secondary | ICD-10-CM | POA: Insufficient documentation

## 2022-05-20 NOTE — Assessment & Plan Note (Signed)
Lab Results  Component Value Date   HGBA1C 10.1 (H) 05/15/2022   Uncontrolled and worsening despite actos 15 mg last visit, for increased to 45 mg qd

## 2022-11-14 ENCOUNTER — Other Ambulatory Visit: Payer: Self-pay | Admitting: Internal Medicine

## 2022-11-14 ENCOUNTER — Encounter: Payer: Self-pay | Admitting: Internal Medicine

## 2022-11-14 ENCOUNTER — Ambulatory Visit (INDEPENDENT_AMBULATORY_CARE_PROVIDER_SITE_OTHER): Payer: Medicare Other | Admitting: Internal Medicine

## 2022-11-14 VITALS — BP 130/78 | HR 86 | Temp 98.0°F | Ht 70.5 in | Wt 192.0 lb

## 2022-11-14 DIAGNOSIS — E7800 Pure hypercholesterolemia, unspecified: Secondary | ICD-10-CM

## 2022-11-14 DIAGNOSIS — I10 Essential (primary) hypertension: Secondary | ICD-10-CM

## 2022-11-14 DIAGNOSIS — E559 Vitamin D deficiency, unspecified: Secondary | ICD-10-CM

## 2022-11-14 DIAGNOSIS — E1165 Type 2 diabetes mellitus with hyperglycemia: Secondary | ICD-10-CM

## 2022-11-14 LAB — HEPATIC FUNCTION PANEL
ALT: 15 U/L (ref 0–53)
AST: 20 U/L (ref 0–37)
Albumin: 4.2 g/dL (ref 3.5–5.2)
Alkaline Phosphatase: 109 U/L (ref 39–117)
Bilirubin, Direct: 0.2 mg/dL (ref 0.0–0.3)
Total Bilirubin: 1.2 mg/dL (ref 0.2–1.2)
Total Protein: 7.6 g/dL (ref 6.0–8.3)

## 2022-11-14 LAB — BASIC METABOLIC PANEL
BUN: 10 mg/dL (ref 6–23)
CO2: 29 mEq/L (ref 19–32)
Calcium: 9.7 mg/dL (ref 8.4–10.5)
Chloride: 102 mEq/L (ref 96–112)
Creatinine, Ser: 0.98 mg/dL (ref 0.40–1.50)
GFR: 78.36 mL/min (ref >60.00)
Glucose, Bld: 168 mg/dL — ABNORMAL HIGH (ref 70–99)
Potassium: 3.8 mEq/L (ref 3.5–5.1)
Sodium: 140 mEq/L (ref 135–145)

## 2022-11-14 LAB — LIPID PANEL
Cholesterol: 219 mg/dL — ABNORMAL HIGH (ref 0–200)
HDL: 38.4 mg/dL — ABNORMAL LOW (ref >39.00)
LDL Cholesterol: 150 mg/dL — ABNORMAL HIGH (ref 0–99)
NonHDL: 180.36
Total CHOL/HDL Ratio: 6
Triglycerides: 153 mg/dL — ABNORMAL HIGH (ref 0.0–149.0)
VLDL: 30.6 mg/dL (ref 0.0–40.0)

## 2022-11-14 LAB — HEMOGLOBIN A1C: Hgb A1c MFr Bld: 11.1 % — ABNORMAL HIGH (ref 4.6–6.5)

## 2022-11-14 LAB — VITAMIN D 25 HYDROXY (VIT D DEFICIENCY, FRACTURES): VITD: 19.05 ng/mL — ABNORMAL LOW (ref 30.00–100.00)

## 2022-11-14 MED ORDER — PIOGLITAZONE HCL 45 MG PO TABS: 45.0000 mg | ORAL_TABLET | Freq: Every day | ORAL | 3 refills | Status: DC
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
## 2022-11-14 NOTE — Assessment & Plan Note (Signed)
Lab Results  Component Value Date   LDLCALC 108 (H) 05/15/2022   Uncontrolled recently, now with good med compliance lipitor 40 mg qd, pt to continue current statin, f/u lab today

## 2022-11-14 NOTE — Assessment & Plan Note (Signed)
Last vitamin D Lab Results  Component Value Date   VD25OH 25.76 (L) 01/08/2022   Low, reminded to start oral replacement

## 2022-11-14 NOTE — Assessment & Plan Note (Signed)
BP Readings from Last 3 Encounters:  11/14/22 130/78  05/15/22 130/76  01/08/22 124/76   Stable, pt to continue medical treatment lotrel 10-20 mg qd

## 2022-11-14 NOTE — Patient Instructions (Addendum)
Please have your Shingrix (shingles) shots done at your local pharmacy.  Please remember to call for your eye doctor appt as you mentioned  Please continue all other medications as before, and refills have been done if requested.  Please have the pharmacy call with any other refills you may need.  Please continue your efforts at being more active, low cholesterol diet, and weight control.  Please keep your appointments with your specialists as you may have planned  Please go to the LAB at the blood drawing area for the tests to be done  You will be contacted by phone if any changes need to be made immediately.  Otherwise, you will receive a letter about your results with an explanation, but please check with MyChart first.  Please remember to sign up for MyChart if you have not done so, as this will be important to you in the future with finding out test results, communicating by private email, and scheduling acute appointments online when needed.  Please make an Appointment to return in 6 months, or sooner if needed

## 2022-11-14 NOTE — Assessment & Plan Note (Signed)
Lab Results  Component Value Date   HGBA1C 10.1 (H) 05/15/2022   Severe uncontrolled recently, symptomatically improved, pt to continue current medical treatment glucotrol xl 10 qd, and f/u A1c today

## 2023-02-11 ENCOUNTER — Telehealth: Payer: Self-pay | Admitting: Internal Medicine

## 2023-02-11 DIAGNOSIS — I700 Atherosclerosis of aorta: Secondary | ICD-10-CM

## 2023-02-11 DIAGNOSIS — E1165 Type 2 diabetes mellitus with hyperglycemia: Secondary | ICD-10-CM

## 2023-02-11 DIAGNOSIS — E7800 Pure hypercholesterolemia, unspecified: Secondary | ICD-10-CM

## 2023-02-11 DIAGNOSIS — R9431 Abnormal electrocardiogram [ECG] [EKG]: Secondary | ICD-10-CM

## 2023-02-11 NOTE — Telephone Encounter (Signed)
Patient states that at his last visit with provider a test was mentioned to check his arteries. Patient is interested in having that test done - he cannot remember the name of it. Call back [phone]

## 2023-02-11 NOTE — Telephone Encounter (Signed)
I believe he means the Cardiac CT score - I will order and hopefully he will be called soon

## 2023-02-12 NOTE — Telephone Encounter (Signed)
Notified pt w/ MD response../l,mb ?

## 2023-02-26 ENCOUNTER — Ambulatory Visit (HOSPITAL_COMMUNITY)
Admission: RE | Admit: 2023-02-26 | Discharge: 2023-02-26 | Disposition: A | Discharge status: Home / Self Care | Payer: Medicare Other | Source: Ambulatory Visit | Attending: Internal Medicine | Admitting: Internal Medicine

## 2023-02-26 DIAGNOSIS — R9431 Abnormal electrocardiogram [ECG] [EKG]: Secondary | ICD-10-CM

## 2023-02-26 DIAGNOSIS — I700 Atherosclerosis of aorta: Secondary | ICD-10-CM | POA: Insufficient documentation

## 2023-02-26 DIAGNOSIS — E1165 Type 2 diabetes mellitus with hyperglycemia: Secondary | ICD-10-CM

## 2023-02-26 DIAGNOSIS — E7800 Pure hypercholesterolemia, unspecified: Secondary | ICD-10-CM

## 2023-03-28 ENCOUNTER — Other Ambulatory Visit: Payer: Self-pay | Admitting: Internal Medicine

## 2023-04-05 ENCOUNTER — Other Ambulatory Visit: Payer: Self-pay

## 2023-04-09 ENCOUNTER — Other Ambulatory Visit: Payer: Self-pay | Admitting: Internal Medicine

## 2023-04-09 ENCOUNTER — Other Ambulatory Visit: Payer: Self-pay

## 2023-05-10 DIAGNOSIS — E119 Type 2 diabetes mellitus without complications: Secondary | ICD-10-CM | POA: Diagnosis not present

## 2023-05-10 DIAGNOSIS — H401132 Primary open-angle glaucoma, bilateral, moderate stage: Secondary | ICD-10-CM | POA: Diagnosis not present

## 2023-05-10 DIAGNOSIS — H524 Presbyopia: Secondary | ICD-10-CM | POA: Diagnosis not present

## 2023-05-10 DIAGNOSIS — H5203 Hypermetropia, bilateral: Secondary | ICD-10-CM | POA: Diagnosis not present

## 2023-05-10 DIAGNOSIS — H35443 Age-related reticular degeneration of retina, bilateral: Secondary | ICD-10-CM | POA: Diagnosis not present

## 2023-05-10 LAB — HM DIABETES EYE EXAM

## 2023-05-16 ENCOUNTER — Encounter: Payer: Self-pay | Admitting: Internal Medicine

## 2023-05-16 ENCOUNTER — Ambulatory Visit (INDEPENDENT_AMBULATORY_CARE_PROVIDER_SITE_OTHER): Payer: Medicare Other | Admitting: Internal Medicine

## 2023-05-16 ENCOUNTER — Other Ambulatory Visit: Payer: Self-pay | Admitting: Internal Medicine

## 2023-05-16 ENCOUNTER — Telehealth: Payer: Self-pay

## 2023-05-16 VITALS — BP 126/78 | HR 50 | Temp 98.6°F | Ht 70.5 in | Wt 195.0 lb

## 2023-05-16 DIAGNOSIS — I700 Atherosclerosis of aorta: Secondary | ICD-10-CM

## 2023-05-16 DIAGNOSIS — Z125 Encounter for screening for malignant neoplasm of prostate: Secondary | ICD-10-CM

## 2023-05-16 DIAGNOSIS — I10 Essential (primary) hypertension: Secondary | ICD-10-CM

## 2023-05-16 DIAGNOSIS — E1165 Type 2 diabetes mellitus with hyperglycemia: Secondary | ICD-10-CM

## 2023-05-16 DIAGNOSIS — Z0000 Encounter for general adult medical examination without abnormal findings: Secondary | ICD-10-CM

## 2023-05-16 DIAGNOSIS — E559 Vitamin D deficiency, unspecified: Secondary | ICD-10-CM | POA: Diagnosis not present

## 2023-05-16 DIAGNOSIS — E538 Deficiency of other specified B group vitamins: Secondary | ICD-10-CM

## 2023-05-16 DIAGNOSIS — E7800 Pure hypercholesterolemia, unspecified: Secondary | ICD-10-CM | POA: Diagnosis not present

## 2023-05-16 DIAGNOSIS — Z7984 Long term (current) use of oral hypoglycemic drugs: Secondary | ICD-10-CM | POA: Diagnosis not present

## 2023-05-16 DIAGNOSIS — Z0001 Encounter for general adult medical examination with abnormal findings: Secondary | ICD-10-CM

## 2023-05-16 LAB — HEPATIC FUNCTION PANEL
ALT: 15 U/L (ref 0–53)
AST: 19 U/L (ref 0–37)
Albumin: 4.2 g/dL (ref 3.5–5.2)
Alkaline Phosphatase: 134 U/L — ABNORMAL HIGH (ref 39–117)
Bilirubin, Direct: 0.2 mg/dL (ref 0.0–0.3)
Total Bilirubin: 0.8 mg/dL (ref 0.2–1.2)
Total Protein: 7.6 g/dL (ref 6.0–8.3)

## 2023-05-16 LAB — CBC WITH DIFFERENTIAL/PLATELET
Basophils Absolute: 0.1 10*3/uL (ref 0.0–0.1)
Basophils Relative: 0.8 % (ref 0.0–3.0)
Eosinophils Absolute: 0.3 10*3/uL (ref 0.0–0.7)
Eosinophils Relative: 3.4 % (ref 0.0–5.0)
HCT: 45 % (ref 39.0–52.0)
Hemoglobin: 14.7 g/dL (ref 13.0–17.0)
Lymphocytes Relative: 39.9 % (ref 12.0–46.0)
Lymphs Abs: 3.5 10*3/uL (ref 0.7–4.0)
MCHC: 32.7 g/dL (ref 30.0–36.0)
MCV: 95.4 fl (ref 78.0–100.0)
Monocytes Absolute: 0.7 10*3/uL (ref 0.1–1.0)
Monocytes Relative: 8.5 % (ref 3.0–12.0)
Neutro Abs: 4.1 10*3/uL (ref 1.4–7.7)
Neutrophils Relative %: 47.4 % (ref 43.0–77.0)
Platelets: 312 10*3/uL (ref 150.0–400.0)
RBC: 4.71 Mil/uL (ref 4.22–5.81)
RDW: 13.6 % (ref 11.5–15.5)
WBC: 8.7 10*3/uL (ref 4.0–10.5)

## 2023-05-16 LAB — LIPID PANEL
Cholesterol: 209 mg/dL — ABNORMAL HIGH (ref 0–200)
HDL: 33.8 mg/dL — ABNORMAL LOW (ref >39.00)
LDL Cholesterol: 155 mg/dL — ABNORMAL HIGH (ref 0–99)
NonHDL: 175.62
Total CHOL/HDL Ratio: 6
Triglycerides: 104 mg/dL (ref 0.0–149.0)
VLDL: 20.8 mg/dL (ref 0.0–40.0)

## 2023-05-16 LAB — BASIC METABOLIC PANEL
BUN: 11 mg/dL (ref 6–23)
CO2: 28 mEq/L (ref 19–32)
Calcium: 9.6 mg/dL (ref 8.4–10.5)
Chloride: 105 mEq/L (ref 96–112)
Creatinine, Ser: 1.03 mg/dL (ref 0.40–1.50)
GFR: 73.56 mL/min (ref >60.00)
Glucose, Bld: 153 mg/dL — ABNORMAL HIGH (ref 70–99)
Potassium: 4.7 mEq/L (ref 3.5–5.1)
Sodium: 140 mEq/L (ref 135–145)

## 2023-05-16 LAB — URINALYSIS, ROUTINE W REFLEX MICROSCOPIC
Bilirubin Urine: NEGATIVE
Ketones, ur: NEGATIVE
Leukocytes,Ua: NEGATIVE
Nitrite: NEGATIVE
Specific Gravity, Urine: 1.025 (ref 1.000–1.030)
Total Protein, Urine: NEGATIVE
Urine Glucose: 100 — AB
Urobilinogen, UA: 1 (ref 0.0–1.0)
pH: 5.5 (ref 5.0–8.0)

## 2023-05-16 LAB — PSA: PSA: 1.6 ng/mL (ref 0.10–4.00)

## 2023-05-16 LAB — TSH: TSH: 3.37 u[IU]/mL (ref 0.35–5.50)

## 2023-05-16 LAB — VITAMIN D 25 HYDROXY (VIT D DEFICIENCY, FRACTURES): VITD: 20.67 ng/mL — ABNORMAL LOW (ref 30.00–100.00)

## 2023-05-16 LAB — VITAMIN B12: Vitamin B-12: 463 pg/mL (ref 211–911)

## 2023-05-16 LAB — MICROALBUMIN / CREATININE URINE RATIO
Creatinine,U: 150.6 mg/dL
Microalb Creat Ratio: 0.5 mg/g (ref 0.0–30.0)
Microalb, Ur: 0.7 mg/dL (ref 0.0–1.9)

## 2023-05-16 LAB — HEMOGLOBIN A1C: Hgb A1c MFr Bld: 8.4 % — ABNORMAL HIGH (ref 4.6–6.5)

## 2023-05-16 MED ORDER — METFORMIN HCL ER 500 MG PO TB24: 500.0000 mg | ORAL_TABLET | Freq: Every day | ORAL | 3 refills | Status: DC

## 2023-05-16 MED ORDER — PIOGLITAZONE HCL 45 MG PO TABS: 45.0000 mg | ORAL_TABLET | Freq: Every day | ORAL | 3 refills | Status: DC

## 2023-05-16 MED ORDER — GLIPIZIDE ER 10 MG PO TB24: 10.0000 mg | ORAL_TABLET | Freq: Every morning | ORAL | 3 refills | Status: DC

## 2023-05-16 MED ORDER — AMLODIPINE BESY-BENAZEPRIL HCL 10-20 MG PO CAPS: 1.0000 | ORAL_CAPSULE | Freq: Every day | ORAL | 3 refills | Status: DC

## 2023-05-16 MED ORDER — ATORVASTATIN CALCIUM 40 MG PO TABS: 40.0000 mg | ORAL_TABLET | Freq: Every day | ORAL | 3 refills | Status: DC
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
## 2023-05-16 NOTE — Patient Instructions (Addendum)

## 2023-05-16 NOTE — Telephone Encounter (Signed)
Called patient lvm to return call, to complete AWV at [phone].  If no return call within 15 minutes, patient may reschedule for the next available appointment with [facility] or CMA.   

## 2023-05-19 ENCOUNTER — Encounter: Payer: Self-pay | Admitting: Internal Medicine

## 2023-05-19 NOTE — Assessment & Plan Note (Signed)
Lab Results  Component Value Date   HGBA1C 8.4 (H) 05/16/2023   Stable, pt to continue current medical treatment glucotrol xl 10 every day, actos 45 d, but also add metformin ER 500 mg - 1 qd

## 2023-05-19 NOTE — Assessment & Plan Note (Signed)
BP Readings from Last 3 Encounters:  05/16/23 126/78  11/14/22 130/78  05/15/22 130/76   Stable, pt to continue medical treatment lotrel 10 - 20 every day,

## 2023-05-19 NOTE — Assessment & Plan Note (Signed)
Pt for exercise, low chol diet, and restart lipitor 40 mg qd

## 2023-05-19 NOTE — Assessment & Plan Note (Signed)
Last vitamin D Lab Results  Component Value Date   VD25OH 20.67 (L) 05/16/2023   Low, to start oral replacement

## 2023-05-19 NOTE — Assessment & Plan Note (Signed)
Lab Results  Component Value Date   LDLCALC 155 (H) 05/16/2023   uncontrolled, goal ldl < 70, pt to restart lipitor 40 mg qd

## 2023-05-19 NOTE — Assessment & Plan Note (Signed)
Age and sex appropriate education and counseling updated with regular exercise and diet Referrals for preventative services - for colonoscopy after oct 2024 Immunizations addressed - for shingrix at pharmacy Smoking counseling  - none needed Evidence for depression or other mood disorder - none significant Most recent labs reviewed. I have personally reviewed and have noted: 1) the patient's medical and social history 2) The patient's current medications and supplements 3) The patient's height, weight, and BMI have been recorded in the chart

## 2023-08-17 ENCOUNTER — Encounter: Payer: Self-pay | Admitting: Internal Medicine

## 2023-11-15 ENCOUNTER — Ambulatory Visit (INDEPENDENT_AMBULATORY_CARE_PROVIDER_SITE_OTHER): Payer: Medicare Other | Admitting: Internal Medicine

## 2023-11-15 ENCOUNTER — Encounter: Payer: Self-pay | Admitting: Internal Medicine

## 2023-11-15 VITALS — BP 126/70 | HR 75 | Temp 98.2°F | Ht 70.5 in | Wt 189.0 lb

## 2023-11-15 DIAGNOSIS — E559 Vitamin D deficiency, unspecified: Secondary | ICD-10-CM | POA: Diagnosis not present

## 2023-11-15 DIAGNOSIS — I10 Essential (primary) hypertension: Secondary | ICD-10-CM

## 2023-11-15 DIAGNOSIS — Z1211 Encounter for screening for malignant neoplasm of colon: Secondary | ICD-10-CM

## 2023-11-15 DIAGNOSIS — E1165 Type 2 diabetes mellitus with hyperglycemia: Secondary | ICD-10-CM

## 2023-11-15 DIAGNOSIS — E7800 Pure hypercholesterolemia, unspecified: Secondary | ICD-10-CM

## 2023-11-15 LAB — HEPATIC FUNCTION PANEL
ALT: 12 U/L (ref 0–53)
AST: 14 U/L (ref 0–37)
Albumin: 3.8 g/dL (ref 3.5–5.2)
Alkaline Phosphatase: 138 U/L — ABNORMAL HIGH (ref 39–117)
Bilirubin, Direct: 0.2 mg/dL (ref 0.0–0.3)
Total Bilirubin: 0.8 mg/dL (ref 0.2–1.2)
Total Protein: 7.8 g/dL (ref 6.0–8.3)

## 2023-11-15 LAB — BASIC METABOLIC PANEL
BUN: 17 mg/dL (ref 6–23)
CO2: 27 meq/L (ref 19–32)
Calcium: 9.4 mg/dL (ref 8.4–10.5)
Chloride: 102 meq/L (ref 96–112)
Creatinine, Ser: 1.02 mg/dL (ref 0.40–1.50)
GFR: 74.17 mL/min (ref >60.00)
Glucose, Bld: 265 mg/dL — ABNORMAL HIGH (ref 70–99)
Potassium: 4.1 meq/L (ref 3.5–5.1)
Sodium: 139 meq/L (ref 135–145)

## 2023-11-15 LAB — LIPID PANEL
Cholesterol: 209 mg/dL — ABNORMAL HIGH (ref 0–200)
HDL: 40.2 mg/dL (ref >39.00)
LDL Cholesterol: 147 mg/dL — ABNORMAL HIGH (ref 0–99)
NonHDL: 168.81
Total CHOL/HDL Ratio: 5
Triglycerides: 110 mg/dL (ref 0.0–149.0)
VLDL: 22 mg/dL (ref 0.0–40.0)

## 2023-11-15 LAB — HEMOGLOBIN A1C: Hgb A1c MFr Bld: 12.6 % — ABNORMAL HIGH (ref 4.6–6.5)

## 2023-11-15 LAB — VITAMIN D 25 HYDROXY (VIT D DEFICIENCY, FRACTURES): VITD: 29.94 ng/mL — ABNORMAL LOW (ref 30.00–100.00)

## 2023-11-15 NOTE — Assessment & Plan Note (Signed)
Last vitamin D Lab Results  Component Value Date   VD25OH 20.67 (L) 05/16/2023   Low, to start oral replacement

## 2023-11-15 NOTE — Assessment & Plan Note (Signed)
Lab Results  Component Value Date   LDLCALC 155 (H) 05/16/2023   Uncontrolled, now taking statin, pt to continue current statin lipitor 40 mg - f/u lab today

## 2023-11-15 NOTE — Patient Instructions (Signed)
Please have your Shingrix (shingles) shots done at your local pharmacy.  Please continue all other medications as before, and refills have been done if requested.  Please have the pharmacy call with any other refills you may need.  Please continue your efforts at being more active, low cholesterol diet, and weight control..  Please keep your appointments with your specialists as you may have planned  You will be contacted regarding the referral for: colonoscopy  Please go to the LAB at the blood drawing area for the tests to be done  You will be contacted by phone if any changes need to be made immediately.  Otherwise, you will receive a letter about your results with an explanation, but please check with MyChart first.  Please make an Appointment to return in 6 months, or sooner if needed

## 2023-11-15 NOTE — Assessment & Plan Note (Signed)
Lab Results  Component Value Date   HGBA1C 8.4 (H) 05/16/2023   Improved but uncontrolled, pt to continue current medical treatment glucotrl xl 10 every day and f/u lab today
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
## 2023-11-15 NOTE — Assessment & Plan Note (Signed)
BP Readings from Last 3 Encounters:  11/15/23 126/70  05/16/23 126/78  11/14/22 130/78   Stable, pt to continue medical treatment lotrel 10 20 qd

## 2023-11-16 ENCOUNTER — Other Ambulatory Visit: Payer: Self-pay | Admitting: Internal Medicine

## 2023-11-16 MED ORDER — ROSUVASTATIN CALCIUM 20 MG PO TABS: 20.0000 mg | ORAL_TABLET | Freq: Every day | ORAL | 3 refills | Status: DC

## 2023-11-16 MED ORDER — PIOGLITAZONE HCL 45 MG PO TABS: 45.0000 mg | ORAL_TABLET | Freq: Every day | ORAL | 3 refills | Status: DC

## 2024-01-02 ENCOUNTER — Ambulatory Visit (INDEPENDENT_AMBULATORY_CARE_PROVIDER_SITE_OTHER): Payer: Medicare Other | Admitting: Emergency Medicine

## 2024-01-02 ENCOUNTER — Encounter: Payer: Self-pay | Admitting: Emergency Medicine

## 2024-01-02 VITALS — BP 112/80 | HR 93 | Temp 97.9°F | Ht 70.5 in | Wt 183.0 lb

## 2024-01-02 DIAGNOSIS — E7800 Pure hypercholesterolemia, unspecified: Secondary | ICD-10-CM

## 2024-01-02 DIAGNOSIS — I10 Essential (primary) hypertension: Secondary | ICD-10-CM

## 2024-01-02 DIAGNOSIS — E1165 Type 2 diabetes mellitus with hyperglycemia: Secondary | ICD-10-CM | POA: Diagnosis not present

## 2024-01-02 DIAGNOSIS — R079 Chest pain, unspecified: Secondary | ICD-10-CM

## 2024-01-02 DIAGNOSIS — I700 Atherosclerosis of aorta: Secondary | ICD-10-CM

## 2024-01-02 NOTE — Assessment & Plan Note (Signed)
 BP Readings from Last 3 Encounters:  01/02/24 112/80  11/15/23 126/70  05/16/23 126/78  Well-controlled hypertension Continue Lotrel 10-20 mg daily

## 2024-01-02 NOTE — Assessment & Plan Note (Signed)
 Clinically stable.  No red flag signs or symptoms Normal EKG.  Normal CT cardiac scoring test done in April 2024 Has nonspecific chest pain Multiple CAD risk factors.  Patient is diabetic and hypertensive with strong family history of CAD. At this point recommend cardiology evaluation ED precautions given Referral placed today.
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
## 2024-01-02 NOTE — Assessment & Plan Note (Signed)
Chronic stable condition Continue atorvastatin 40 mg daily 

## 2024-01-02 NOTE — Assessment & Plan Note (Signed)
 Lab Results  Component Value Date   HGBA1C 12.6 (H) 11/15/2023  Controlled diabetes Presently on glipizide  and Actos  Need to follow-up with PCP

## 2024-01-02 NOTE — Patient Instructions (Signed)
Nonspecific Chest Pain Chest pain can be caused by many different conditions. Some causes of chest pain can be life-threatening. These will require treatment right away. Serious causes of chest pain include: Heart attack. A tear in the body's main blood vessel. Redness and swelling (inflammation) around your heart. Blood clot in your lungs. Other causes of chest pain may not be so serious. These include: Heartburn. Anxiety or stress. Damage to bones or muscles in your chest. Lung infections. Chest pain can feel like: Pain or discomfort in your chest. Crushing, pressure, aching, or squeezing pain. Burning or tingling. Dull or sharp pain that is worse when you move, cough, or take a deep breath. Pain or discomfort that is also felt in your back, neck, jaw, shoulder, or arm, or pain that spreads to any of these areas. It is hard to know whether your pain is caused by something that is serious or something that is not so serious. So it is important to see your doctor right away if you have chest pain. Follow these instructions at home: Medicines Take over-the-counter and prescription medicines only as told by your doctor. If you were prescribed an antibiotic medicine, take it as told by your doctor. Do not stop taking the antibiotic even if you start to feel better. Lifestyle  Rest as told by your doctor. Do not use any products that contain nicotine or tobacco, such as cigarettes, e-cigarettes, and chewing tobacco. If you need help quitting, ask your doctor. Do not drink alcohol. Make lifestyle changes as told by your doctor. These may include: Getting regular exercise. Ask your doctor what activities are safe for you. Eating a heart-healthy diet. A diet and nutrition specialist (dietitian) can help you to learn healthy eating options. Staying at a healthy weight. Treating diabetes or high blood pressure, if needed. Lowering your stress. Activities such as yoga and relaxation techniques  can help. General instructions Pay attention to any changes in your symptoms. Tell your doctor about them or any new symptoms. Avoid any activities that cause chest pain. Keep all follow-up visits as told by your doctor. This is important. You may need more testing if your chest pain does not go away. Contact a doctor if: Your chest pain does not go away. You feel depressed. You have a fever. Get help right away if: Your chest pain is worse. You have a cough that gets worse, or you cough up blood. You have very bad (severe) pain in your belly (abdomen). You pass out (faint). You have either of these for no clear reason: Sudden chest discomfort. Sudden discomfort in your arms, back, neck, or jaw. You have shortness of breath at any time. You suddenly start to sweat, or your skin gets clammy. You feel sick to your stomach (nauseous). You throw up (vomit). You suddenly feel lightheaded or dizzy. You feel very weak or tired. Your heart starts to beat fast, or it feels like it is skipping beats. These symptoms may be an emergency. Do not wait to see if the symptoms will go away. Get medical help right away. Call your local emergency services (911 in the [place] Do not drive yourself to the hospital. Summary Chest pain can be caused by many different conditions. The cause may be serious and need treatment right away. If you have chest pain, see your doctor right away. Follow your doctor's instructions for taking medicines and making lifestyle changes. Keep all follow-up visits as told by your doctor. This includes visits for any further   testing if your chest pain does not go away. Be sure to know the signs that show that your condition has become worse. Get help right away if you have these symptoms. This information is not intended to replace advice given to you by your health care provider. Make sure you discuss any questions you have with your health care provider. Document Revised:  09/27/2022 Document Reviewed: 09/27/2022 Elsevier Patient Education  2024 Elsevier Inc.  

## 2024-01-02 NOTE — Assessment & Plan Note (Addendum)
 Diet and nutrition discussed. Continue atorvastatin 40 mg daily.

## 2024-01-06 ENCOUNTER — Other Ambulatory Visit (INDEPENDENT_AMBULATORY_CARE_PROVIDER_SITE_OTHER): Payer: Medicare Other | Admitting: Radiology

## 2024-01-06 DIAGNOSIS — I10 Essential (primary) hypertension: Secondary | ICD-10-CM

## 2024-02-18 ENCOUNTER — Telehealth: Payer: Self-pay

## 2024-02-18 NOTE — Telephone Encounter (Signed)
 Patient was identified as falling into the [place] Measure - Diabetes.   Patient was: Appointment scheduled for lab or office visit for A1c. Patient is on a 6 month schedule, A1c due in June. Patient scheduled for June

## 2024-02-26 ENCOUNTER — Other Ambulatory Visit: Payer: Self-pay | Admitting: Internal Medicine

## 2024-02-26 ENCOUNTER — Ambulatory Visit (INDEPENDENT_AMBULATORY_CARE_PROVIDER_SITE_OTHER): Admitting: Internal Medicine

## 2024-02-26 ENCOUNTER — Encounter: Payer: Self-pay | Admitting: Internal Medicine

## 2024-02-26 VITALS — BP 128/72 | HR 65 | Temp 98.5°F | Ht 70.5 in | Wt 177.0 lb

## 2024-02-26 DIAGNOSIS — Z0000 Encounter for general adult medical examination without abnormal findings: Secondary | ICD-10-CM | POA: Diagnosis not present

## 2024-02-26 DIAGNOSIS — Z0001 Encounter for general adult medical examination with abnormal findings: Secondary | ICD-10-CM | POA: Insufficient documentation

## 2024-02-26 DIAGNOSIS — Z794 Long term (current) use of insulin: Secondary | ICD-10-CM

## 2024-02-26 DIAGNOSIS — E7800 Pure hypercholesterolemia, unspecified: Secondary | ICD-10-CM | POA: Diagnosis not present

## 2024-02-26 DIAGNOSIS — E559 Vitamin D deficiency, unspecified: Secondary | ICD-10-CM

## 2024-02-26 DIAGNOSIS — Z125 Encounter for screening for malignant neoplasm of prostate: Secondary | ICD-10-CM

## 2024-02-26 DIAGNOSIS — Z7984 Long term (current) use of oral hypoglycemic drugs: Secondary | ICD-10-CM | POA: Diagnosis not present

## 2024-02-26 DIAGNOSIS — E538 Deficiency of other specified B group vitamins: Secondary | ICD-10-CM | POA: Diagnosis not present

## 2024-02-26 DIAGNOSIS — I10 Essential (primary) hypertension: Secondary | ICD-10-CM

## 2024-02-26 DIAGNOSIS — E1165 Type 2 diabetes mellitus with hyperglycemia: Secondary | ICD-10-CM | POA: Diagnosis not present

## 2024-02-26 DIAGNOSIS — Z1211 Encounter for screening for malignant neoplasm of colon: Secondary | ICD-10-CM

## 2024-02-26 LAB — CBC WITH DIFFERENTIAL/PLATELET
Basophils Absolute: 0.1 10*3/uL (ref 0.0–0.1)
Basophils Relative: 0.6 % (ref 0.0–3.0)
Eosinophils Absolute: 0.1 10*3/uL (ref 0.0–0.7)
Eosinophils Relative: 1 % (ref 0.0–5.0)
HCT: 45.7 % (ref 39.0–52.0)
Hemoglobin: 15.4 g/dL (ref 13.0–17.0)
Lymphocytes Relative: 33.7 % (ref 12.0–46.0)
Lymphs Abs: 3.5 10*3/uL (ref 0.7–4.0)
MCHC: 33.6 g/dL (ref 30.0–36.0)
MCV: 95.3 fl (ref 78.0–100.0)
Monocytes Absolute: 0.8 10*3/uL (ref 0.1–1.0)
Monocytes Relative: 7.6 % (ref 3.0–12.0)
Neutro Abs: 5.9 10*3/uL (ref 1.4–7.7)
Neutrophils Relative %: 57.1 % (ref 43.0–77.0)
Platelets: 275 10*3/uL (ref 150.0–400.0)
RBC: 4.8 Mil/uL (ref 4.22–5.81)
RDW: 13.7 % (ref 11.5–15.5)
WBC: 10.3 10*3/uL (ref 4.0–10.5)

## 2024-02-26 LAB — HEPATIC FUNCTION PANEL
ALT: 11 U/L (ref 0–53)
AST: 15 U/L (ref 0–37)
Albumin: 4.3 g/dL (ref 3.5–5.2)
Alkaline Phosphatase: 128 U/L — ABNORMAL HIGH (ref 39–117)
Bilirubin, Direct: 0.2 mg/dL (ref 0.0–0.3)
Total Bilirubin: 1.1 mg/dL (ref 0.2–1.2)
Total Protein: 7.5 g/dL (ref 6.0–8.3)

## 2024-02-26 LAB — VITAMIN D 25 HYDROXY (VIT D DEFICIENCY, FRACTURES): VITD: 25.44 ng/mL — ABNORMAL LOW (ref 30.00–100.00)

## 2024-02-26 LAB — URINALYSIS, ROUTINE W REFLEX MICROSCOPIC
Bilirubin Urine: NEGATIVE
Hgb urine dipstick: NEGATIVE
Ketones, ur: 15 — AB
Leukocytes,Ua: NEGATIVE
Nitrite: NEGATIVE
RBC / HPF: NONE SEEN (ref 0–?)
Specific Gravity, Urine: 1.015 (ref 1.000–1.030)
Total Protein, Urine: NEGATIVE
Urine Glucose: >=1000 — AB
Urobilinogen, UA: 0.2 (ref 0.0–1.0)
pH: 6 (ref 5.0–8.0)

## 2024-02-26 LAB — TSH: TSH: 1.59 u[IU]/mL (ref 0.35–5.50)

## 2024-02-26 LAB — BASIC METABOLIC PANEL WITH GFR
BUN: 17 mg/dL (ref 6–23)
CO2: 25 meq/L (ref 19–32)
Calcium: 9.7 mg/dL (ref 8.4–10.5)
Chloride: 96 meq/L (ref 96–112)
Creatinine, Ser: 1.06 mg/dL (ref 0.40–1.50)
GFR: 70.68 mL/min (ref >60.00)
Glucose, Bld: 360 mg/dL — ABNORMAL HIGH (ref 70–99)
Potassium: 4.7 meq/L (ref 3.5–5.1)
Sodium: 133 meq/L — ABNORMAL LOW (ref 135–145)

## 2024-02-26 LAB — LIPID PANEL
Cholesterol: 218 mg/dL — ABNORMAL HIGH (ref 0–200)
HDL: 36 mg/dL — ABNORMAL LOW (ref >39.00)
LDL Cholesterol: 156 mg/dL — ABNORMAL HIGH (ref 0–99)
NonHDL: 181.94
Total CHOL/HDL Ratio: 6
Triglycerides: 131 mg/dL (ref 0.0–149.0)
VLDL: 26.2 mg/dL (ref 0.0–40.0)

## 2024-02-26 LAB — MICROALBUMIN / CREATININE URINE RATIO
Creatinine,U: 58.9 mg/dL
Microalb Creat Ratio: UNDETERMINED mg/g (ref 0.0–30.0)
Microalb, Ur: <0.7 mg/dL

## 2024-02-26 LAB — VITAMIN B12: Vitamin B-12: 603 pg/mL (ref 211–911)

## 2024-02-26 LAB — HEMOGLOBIN A1C: Hgb A1c MFr Bld: 14.9 % — ABNORMAL HIGH (ref 4.6–6.5)

## 2024-02-26 LAB — PSA: PSA: 2.19 ng/mL (ref 0.10–4.00)

## 2024-02-26 MED ORDER — BENAZEPRIL HCL 10 MG PO TABS: 10.0000 mg | ORAL_TABLET | Freq: Every day | ORAL | 3 refills | Status: DC

## 2024-02-26 MED ORDER — ACCU-CHEK GUIDE ME W/DEVICE KIT: PACK | 0 refills | Status: DC

## 2024-02-26 MED ORDER — LANTUS SOLOSTAR 100 UNIT/ML ~~LOC~~ SOPN: 10.0000 [IU] | PEN_INJECTOR | Freq: Every day | SUBCUTANEOUS | 11 refills | Status: DC

## 2024-02-26 MED ORDER — PIOGLITAZONE HCL 45 MG PO TABS: 45.0000 mg | ORAL_TABLET | Freq: Every day | ORAL | 3 refills | Status: AC

## 2024-02-26 MED ORDER — ROSUVASTATIN CALCIUM 20 MG PO TABS: 20.0000 mg | ORAL_TABLET | Freq: Every day | ORAL | 3 refills | Status: AC

## 2024-02-26 MED ORDER — LANCETS MISC: 12 refills | Status: DC

## 2024-02-26 MED ORDER — ACCU-CHEK GUIDE TEST VI STRP: ORAL_STRIP | 12 refills | Status: DC

## 2024-02-26 MED ORDER — GLIPIZIDE ER 10 MG PO TB24: 10.0000 mg | ORAL_TABLET | Freq: Every morning | ORAL | 3 refills | Status: AC

## 2024-02-26 MED ORDER — LANTUS SOLOSTAR 100 UNIT/ML ~~LOC~~ SOPN: 20.0000 [IU] | PEN_INJECTOR | Freq: Every day | SUBCUTANEOUS | 11 refills | Status: DC

## 2024-02-26 NOTE — Assessment & Plan Note (Signed)
 Last vitamin D Lab Results  Component Value Date   VD25OH 25.44 (L) 02/26/2024   Low, to start oral replacement

## 2024-02-26 NOTE — Assessment & Plan Note (Addendum)
 Lab Results  Component Value Date   HGBA1C 14.9 (H) 02/26/2024   Sevee uncontrolled, pt to restart glucotrol xl 10 every day, actos 45 mg every day, and start lantus 10 units with intent to titrate; also refer office pharmacist with med non compliance and disease education

## 2024-02-26 NOTE — Assessment & Plan Note (Signed)
 BP Readings from Last 3 Encounters:  02/26/24 128/72  01/02/24 112/80  11/15/23 126/70   Stable, pt to continue medical treatment lotensin 10 mg qd

## 2024-02-26 NOTE — Assessment & Plan Note (Signed)
 Lab Results  Component Value Date   LDLCALC 156 (H) 02/26/2024   Severe uncontrolled, for restart crestor 20 mg every day

## 2024-02-26 NOTE — Assessment & Plan Note (Addendum)
Age and sex appropriate education and counseling updated with regular exercise and diet Referrals for preventative services - for colonoscopy Immunizations addressed - for shingrix at pharmacy Smoking counseling  - none needed Evidence for depression or other mood disorder - none significant Most recent labs reviewed. I have personally reviewed and have noted: 1) the patient's medical and social history 2) The patient's current medications and supplements 3) The patient's height, weight, and BMI have been recorded in the chart

## 2024-02-26 NOTE — Patient Instructions (Addendum)
 Please take all new medication as prescribed - the new insulin once per day  Ok to take benazepril 10 mg per day for blood pressure (not the amlodipine-benazepril that was on your list today)  Please continue all other medications as before, and refills have been done for all meds  Please have the pharmacy call with any other refills you may need.  Please continue your efforts at being more active, low cholesterol diet, and weight control.  You are otherwise up to date with prevention measures today.  Please keep your appointments with your specialists as you may have planned  You will be contacted regarding the referral for: Cristy the office Pharmacist  Please go to the LAB at the blood drawing area for the tests to be done  You will be contacted by phone if any changes need to be made immediately.  Otherwise, you will receive a letter about your results with an explanation, but please check with MyChart first.  Please make an Appointment to return in 6 months, or sooner if needed
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
## 2024-02-27 ENCOUNTER — Telehealth: Payer: Self-pay | Admitting: *Deleted

## 2024-02-27 NOTE — Progress Notes (Signed)
 Care Guide Pharmacy Note  02/27/2024 Name: Benjamin Patton MRN: [id number] DOB: 04-16-1951  Referred By: Corwin Levins, MD Reason for referral: Care Coordination (Outreach to schedule referral with pharmacist )   Benjamin Patton is a 73 y.o. year old male who is a primary care patient of Corwin Levins, MD.  Benjamin Patton was referred to the pharmacist for assistance related to: DMII  Successful contact was made with the patient to discuss pharmacy services including being ready for the pharmacist to call at least 5 minutes before the scheduled appointment time and to have medication bottles and any blood pressure readings ready for review. The patient agreed to meet with the pharmacist via telephone visit on 03/17/2024  Burman Nieves, CMA [place]  [facility] Guide Direct Dial: [phone]  Fax: [phone] Website: [email or web address]

## 2024-03-17 ENCOUNTER — Other Ambulatory Visit (INDEPENDENT_AMBULATORY_CARE_PROVIDER_SITE_OTHER): Admitting: Pharmacist

## 2024-03-17 DIAGNOSIS — E1165 Type 2 diabetes mellitus with hyperglycemia: Secondary | ICD-10-CM

## 2024-03-17 NOTE — Progress Notes (Signed)
 03/17/2024 Name: Benjamin Patton MRN: [id number] DOB: 03-16-1951  Chief Complaint  Patient presents with   Diabetes   Medication Management    Benjamin Patton is a 73 y.o. year old male who presented for a telephone visit.   They were referred to the pharmacist by their PCP for assistance in managing diabetes.   Subjective:  Care Team: Primary Care Provider: [name], MD ; Next Scheduled Visit: 08/28/24   Medication Access/Adherence  Current Pharmacy:  [phone] Jon[facility] Nestle, [place] - [address] [place] [address] Phone: [phone] Fax: [phone]   Patient reports affordability concerns with their medications: Yes  Patient reports access/transportation concerns to their pharmacy: No  Patient reports adherence concerns with their medications:  No    Pt notes test strips for his glucometer were $70   Diabetes:  Current medications: Glipizide  XL 10 mg daily, pioglitazone  25 mg daily, Lantus  20 units daily (just started 02/26/24) Medications tried in the past: metformin   Current glucose readings: BG this morning 93, another morning 88 In the evening 100-112 Using AccuChek Guide meter; testing 1-3x times daily   Patient denies hypoglycemic s/sx including dizziness, shakiness, sweating. Patient denies hyperglycemic symptoms including polyuria, polydipsia, polyphagia, nocturia, neuropathy, blurred vision.  Pt notes he is feeling better since his sugars have been lower.   Current meal patterns:  - Breakfast: boiled egg, tuna - Lunch: doesn't eat - Supper: largest meal - Snacks: walnuts, fruit (apples, oranges)   Current physical activity: has started exercising every morning - bicycle and weight training   Objective:  Lab Results  Component Value Date   HGBA1C 14.9 (H) 02/26/2024    Lab Results  Component Value Date   CREATININE 1.06 02/26/2024   BUN 17 02/26/2024   NA 133 (L) 02/26/2024   K 4.7  02/26/2024   CL 96 02/26/2024   CO2 25 02/26/2024    Lab Results  Component Value Date   CHOL 218 (H) 02/26/2024   HDL 36.00 (L) 02/26/2024   LDLCALC 156 (H) 02/26/2024   LDLDIRECT 183.0 11/08/2020   TRIG 131.0 02/26/2024   CHOLHDL 6 02/26/2024    Medications Reviewed Today     Reviewed by Dion Frankel, RPH (Pharmacist) on 03/17/24 at [phone]  Med List Status: <None>   Medication Order Taking? Sig Documenting Provider Last Dose Status Informant  benazepril  (LOTENSIN ) 10 MG tablet [id number] No Take 1 tablet (10 mg total) by mouth daily.  Patient not taking: Reported on 03/17/2024   [name], MD Not Taking Active            Med Note Lolly Riser, Zaylin Pistilli R   Tue Mar 17, 2024  9:52 AM) Pt is still taking amlodipine /benazepril   Blood Glucose Monitoring Suppl (ACCU-CHEK GUIDE ME) w/Device KIT [id number]  Use as directed four times per day E11.9 [name], MD  Active   Cholecalciferol  50 MCG (2000 UT) TABS [id number]  1 tab by mouth once daily [name], MD  Active   glipiZIDE  (GLUCOTROL  XL) 10 MG 24 hr tablet [id number] Yes Take 1 tablet (10 mg total) by mouth every morning. [name], MD Taking Active   glucose blood (ACCU-CHEK GUIDE TEST) test strip [id number]  Use as instructed four times per day E11.9 [name], MD  Active   insulin glargine  (LANTUS  SOLOSTAR) 100 UNIT/ML Solostar Pen [id number] Yes Inject 20 Units into the skin daily. E11.9 [name], MD Taking Active  Lancets MISC [id number]  Use as directed four times per day E11.9 [name], MD  Active   pioglitazone  (ACTOS ) 45 MG tablet [id number] Yes Take 1 tablet (45 mg total) by mouth daily. [name], MD Taking Active   rosuvastatin  (CRESTOR ) 20 MG tablet [id number] Yes Take 1 tablet (20 mg total) by mouth daily. [name], MD Taking Active               Assessment/Plan:   Diabetes: - Currently uncontrolled, A1c goal <7% - Reviewed long term cardiovascular and renal outcomes of  uncontrolled blood sugar - Reviewed goal A1c, goal fasting, and goal 2 hour post prandial glucose - Reviewed dietary modifications including balanced meals/snacks, increased fiber and protein, starchy foods/carbs in moderation - Reviewed lifestyle modifications including: increased activity, weight training, cardio - Recommend to continue current regimen  - Recommend to check BG in the AM and prior to dinner or bedtime - Consider CGM sample if pt is agreeable in the future to get better idea of BG trends, especially if next A1c remains high - Plan for A1c in July   Follow Up Plan: 5/13  Rainelle Bur, PharmD, BCPS, CPP Clinical Pharmacist Practitioner [facility] [phone]

## 2024-03-17 NOTE — Patient Instructions (Addendum)
 It was a pleasure speaking with you today!  Continue your current medication regimen.  Check blood sugar in the AM prior to eating and prior to dinner or bedtime. Keep a log for us  to review when I call in 3 weeks.  Feel free to call with any questions or concerns!  Rainelle Bur, PharmD, BCPS, CPP Clinical Pharmacist Practitioner [place] [facility] [phone]

## 2024-03-19 ENCOUNTER — Encounter: Payer: Self-pay | Admitting: Internal Medicine

## 2024-04-07 ENCOUNTER — Other Ambulatory Visit (INDEPENDENT_AMBULATORY_CARE_PROVIDER_SITE_OTHER): Admitting: Pharmacist

## 2024-04-07 DIAGNOSIS — E7800 Pure hypercholesterolemia, unspecified: Secondary | ICD-10-CM

## 2024-04-07 DIAGNOSIS — E1165 Type 2 diabetes mellitus with hyperglycemia: Secondary | ICD-10-CM

## 2024-04-07 NOTE — Progress Notes (Signed)
 04/07/2024 Name: LORNE MEDLEY MRN: [id number] DOB: Jan 21, 1951  Chief Complaint  Patient presents with   Hyperlipidemia   Diabetes   Medication Management    Benjamin Patton is a 73 y.o. year old male who presented for an in-person visit.   They were referred to the pharmacist by their PCP for assistance in managing diabetes.   Subjective:  Care Team: Primary Care Provider: [name], MD ; Next Scheduled Visit: 08/28/24   Medication Access/Adherence  [phone], [place] - [address] [place] [address] Phone: [phone] Fax: [phone]   Patient reports affordability concerns with their medications: Yes  Patient reports access/transportation concerns to their pharmacy: No  Patient reports adherence concerns with their medications:  No    Pt notes test strips for his glucometer were $70. His pharmacy was supposed to look at the preferred brand, but he hasn't went back to the pharmacy to check in on this.   Diabetes:  Current medications: Glipizide  XL 10 mg daily, pioglitazone  25 mg daily, Lantus  20 units daily (just started 02/26/24) Medications tried in the past: metformin   Current glucose readings: BG this morning 166 (this is a higher value for him) Last week in AM: 88, 90, 102, 116, 122 PM: 150s-160s Using AccuChek Guide meter; testing 2x times daily in the morning and at night  Confirm patient has enough lancets and pen needles. Running low on test strips, but plans to go by pharmacy soon to pick some up.  Patient denies hypoglycemic s/sx including dizziness, shakiness, sweating. Lowest BG he's seen is 74.  Patient denies hyperglycemic symptoms including polyuria, polydipsia, polyphagia, nocturia, neuropathy, blurred vision. Polyuria has resolved, used to get up 2x/night. Has also started gaining back weight since his Bgs have improved - weighed himself at 185 lbs this AM.  Pt  notes he is feeling better since his sugars have been lower.   Current meal patterns:  - Breakfast: boiled egg, tuna - Lunch: doesn't eat - Supper: largest meal - Snacks: walnuts, fruit (apples, oranges) Tries to avoid sweets  Current physical activity: has started exercising every morning - bicycle and weight training; 45-7min per session  ASCVD Risk:  Current medications: rosuvastatin  20 mg daily *Reports only taking 1x/week as he does not like taking too many pills.  The 10-year ASCVD risk score (Arnett DK, et al., 2019) is: 37.6%   Values used to calculate the score:     Age: 69 years     Sex: Male     Is Non-Hispanic African American: Yes     Diabetic: Yes     Tobacco smoker: No     Systolic Blood Pressure: 128 mmHg     Is BP treated: Yes     HDL Cholesterol: 36 mg/dL     Total Cholesterol: 218 mg/dL   Objective:  Lab Results  Component Value Date   HGBA1C 14.9 (H) 02/26/2024    Lab Results  Component Value Date   CREATININE 1.06 02/26/2024   BUN 17 02/26/2024   NA 133 (L) 02/26/2024   K 4.7 02/26/2024   CL 96 02/26/2024   CO2 25 02/26/2024    Lab Results  Component Value Date   CHOL 218 (H) 02/26/2024   HDL 36.00 (L) 02/26/2024   LDLCALC 156 (H) 02/26/2024   LDLDIRECT 183.0 11/08/2020   TRIG 131.0 02/26/2024   CHOLHDL 6 02/26/2024    Medications Reviewed Today  Reviewed by Marybelle Smiling, RPH (Pharmacist) on 04/07/24 at 0933  Med List Status: <None>   Medication Order Taking? Sig Documenting Provider Last Dose Status Informant  amLODipine -benazepril  (LOTREL) 10-20 MG capsule [id number] Yes Take 1 capsule by mouth daily. [provider] Taking Active   Blood Glucose Monitoring Suppl (ACCU-CHEK GUIDE ME) w/Device KIT [id number] Yes Use as directed four times per day E11.9 [name], MD Taking Active   Cholecalciferol  50 MCG (2000 UT) TABS [id number] Yes 1 tab by mouth once daily [name], MD Taking Active   glipiZIDE  (GLUCOTROL   XL) 10 MG 24 hr tablet [id number] Yes Take 1 tablet (10 mg total) by mouth every morning. [name], MD Taking Active   glucose blood (ACCU-CHEK GUIDE TEST) test strip [id number] Yes Use as instructed four times per day E11.9 [name], MD Taking Active   insulin glargine  (LANTUS  SOLOSTAR) 100 UNIT/ML Solostar Pen [id number] Yes Inject 20 Units into the skin daily. E11.9 [name], MD Taking Active   Lancets MISC [id number] Yes Use as directed four times per day E11.9 [name], MD Taking Active   pioglitazone  (ACTOS ) 45 MG tablet [id number] Yes Take 1 tablet (45 mg total) by mouth daily. [name], MD Taking Active   rosuvastatin  (CRESTOR ) 20 MG tablet [id number] Yes Take 1 tablet (20 mg total) by mouth daily. [name], MD Taking Active            Med Note Vicenta Graft, JESSICA H   Tue Apr 07, 2024  9:33 AM) Only taking about 1x/week            Assessment/Plan:   Diabetes: - Currently uncontrolled, A1c goal <7% - Reviewed long term cardiovascular and renal outcomes of uncontrolled blood sugar - Reviewed goal A1c, goal fasting, and goal 2 hour post prandial glucose - Reviewed dietary modifications including balanced meals/snacks, increased fiber and protein, starchy foods/carbs in moderation - Reviewed lifestyle modifications including: increased activity, weight training, cardio - Recommend to continue current regimen  - Patient declined CGM as he works outside often and on cars, so he doesn't feel like it will stay on. Offered CGM sample to see how it stays on with his lifestyle but patient declined. Noted he has a friend on insulin with a CGM and the alerts and trying to keep it on seems more aggravating than anything. - Plan for A1c in July  ASCVD Risk - Educated patient on current ASCVD risk - Encouraged adherence with rosuvastatin  20 mg daily. Patient voiced understanding as to why it's important to take and is agreeable to trying to take it daily - Also recommended  patient start taking aspirin  81 mg daily given ASCVD risk and aortic atherosclerosis, but can wait to start this until adherence with rosuvastatin  improves  Follow Up Plan: 6/3  Abelina Abide, PharmD PGY1 Pharmacy Resident 04/07/2024 9:35 AM  Rainelle Bur, PharmD, BCPS, CPP Clinical Pharmacist Practitioner [facility] [phone]

## 2024-04-07 NOTE — Patient Instructions (Addendum)
 It was a pleasure speaking to you today!  Continue glipizide , pioglitazone , and Lantus  as prescribed. Continue working on improving diet and exercising daily. Let us  know if the test strips are still $70 when you go by the pharmacy.  Focus on taking rosuvastatin  every day. If able, start taking aspirin  81 mg daily.  Feel free to call the office with any questions or concerns!  Abelina Abide, PharmD PGY1 Pharmacy Resident 04/07/2024 10:32 AM  Rainelle Bur, PharmD, BCPS, CPP Clinical Pharmacist Practitioner [facility] [phone]

## 2024-04-25 ENCOUNTER — Telehealth (HOSPITAL_COMMUNITY): Payer: Self-pay | Admitting: Physician Assistant

## 2024-04-25 ENCOUNTER — Encounter (HOSPITAL_COMMUNITY): Payer: Self-pay

## 2024-04-25 ENCOUNTER — Emergency Department (HOSPITAL_COMMUNITY)

## 2024-04-25 ENCOUNTER — Emergency Department (HOSPITAL_COMMUNITY)
Admission: EM | Admit: 2024-04-25 | Discharge: 2024-04-25 | Disposition: A | Discharge status: Home / Self Care | Source: Home / Self Care | Attending: Emergency Medicine | Admitting: Emergency Medicine

## 2024-04-25 ENCOUNTER — Other Ambulatory Visit: Payer: Self-pay

## 2024-04-25 ENCOUNTER — Telehealth: Payer: Self-pay | Admitting: Surgery

## 2024-04-25 DIAGNOSIS — Q625 Duplication of ureter: Secondary | ICD-10-CM | POA: Diagnosis not present

## 2024-04-25 DIAGNOSIS — R0602 Shortness of breath: Secondary | ICD-10-CM | POA: Diagnosis not present

## 2024-04-25 DIAGNOSIS — Z79899 Other long term (current) drug therapy: Secondary | ICD-10-CM | POA: Insufficient documentation

## 2024-04-25 DIAGNOSIS — K760 Fatty (change of) liver, not elsewhere classified: Secondary | ICD-10-CM | POA: Diagnosis not present

## 2024-04-25 DIAGNOSIS — D72829 Elevated white blood cell count, unspecified: Secondary | ICD-10-CM | POA: Insufficient documentation

## 2024-04-25 DIAGNOSIS — K658 Other peritonitis: Secondary | ICD-10-CM | POA: Diagnosis not present

## 2024-04-25 DIAGNOSIS — R1084 Generalized abdominal pain: Secondary | ICD-10-CM | POA: Diagnosis not present

## 2024-04-25 DIAGNOSIS — Z794 Long term (current) use of insulin: Secondary | ICD-10-CM | POA: Diagnosis not present

## 2024-04-25 DIAGNOSIS — E119 Type 2 diabetes mellitus without complications: Secondary | ICD-10-CM | POA: Diagnosis not present

## 2024-04-25 DIAGNOSIS — R000 Tachycardia, unspecified: Secondary | ICD-10-CM | POA: Diagnosis not present

## 2024-04-25 DIAGNOSIS — R17 Unspecified jaundice: Secondary | ICD-10-CM | POA: Diagnosis not present

## 2024-04-25 DIAGNOSIS — K449 Diaphragmatic hernia without obstruction or gangrene: Secondary | ICD-10-CM | POA: Diagnosis not present

## 2024-04-25 DIAGNOSIS — R6520 Severe sepsis without septic shock: Secondary | ICD-10-CM | POA: Diagnosis not present

## 2024-04-25 DIAGNOSIS — E7800 Pure hypercholesterolemia, unspecified: Secondary | ICD-10-CM | POA: Diagnosis not present

## 2024-04-25 DIAGNOSIS — R1011 Right upper quadrant pain: Secondary | ICD-10-CM | POA: Diagnosis not present

## 2024-04-25 DIAGNOSIS — K219 Gastro-esophageal reflux disease without esophagitis: Secondary | ICD-10-CM | POA: Diagnosis not present

## 2024-04-25 DIAGNOSIS — K810 Acute cholecystitis: Secondary | ICD-10-CM | POA: Diagnosis not present

## 2024-04-25 DIAGNOSIS — Z9081 Acquired absence of spleen: Secondary | ICD-10-CM | POA: Diagnosis not present

## 2024-04-25 DIAGNOSIS — Z87442 Personal history of urinary calculi: Secondary | ICD-10-CM | POA: Diagnosis not present

## 2024-04-25 DIAGNOSIS — N179 Acute kidney failure, unspecified: Secondary | ICD-10-CM | POA: Diagnosis not present

## 2024-04-25 DIAGNOSIS — Z7984 Long term (current) use of oral hypoglycemic drugs: Secondary | ICD-10-CM | POA: Insufficient documentation

## 2024-04-25 DIAGNOSIS — E1165 Type 2 diabetes mellitus with hyperglycemia: Secondary | ICD-10-CM | POA: Diagnosis not present

## 2024-04-25 DIAGNOSIS — E860 Dehydration: Secondary | ICD-10-CM | POA: Diagnosis not present

## 2024-04-25 DIAGNOSIS — K838 Other specified diseases of biliary tract: Secondary | ICD-10-CM | POA: Diagnosis not present

## 2024-04-25 DIAGNOSIS — A419 Sepsis, unspecified organism: Secondary | ICD-10-CM | POA: Diagnosis not present

## 2024-04-25 DIAGNOSIS — D72828 Other elevated white blood cell count: Secondary | ICD-10-CM | POA: Diagnosis not present

## 2024-04-25 DIAGNOSIS — R011 Cardiac murmur, unspecified: Secondary | ICD-10-CM | POA: Diagnosis not present

## 2024-04-25 DIAGNOSIS — K82A1 Gangrene of gallbladder in cholecystitis: Secondary | ICD-10-CM | POA: Diagnosis not present

## 2024-04-25 DIAGNOSIS — J9811 Atelectasis: Secondary | ICD-10-CM | POA: Diagnosis not present

## 2024-04-25 DIAGNOSIS — R109 Unspecified abdominal pain: Secondary | ICD-10-CM | POA: Diagnosis not present

## 2024-04-25 DIAGNOSIS — Z888 Allergy status to other drugs, medicaments and biological substances status: Secondary | ICD-10-CM | POA: Diagnosis not present

## 2024-04-25 DIAGNOSIS — N281 Cyst of kidney, acquired: Secondary | ICD-10-CM | POA: Diagnosis not present

## 2024-04-25 DIAGNOSIS — E872 Acidosis, unspecified: Secondary | ICD-10-CM | POA: Diagnosis not present

## 2024-04-25 DIAGNOSIS — K828 Other specified diseases of gallbladder: Secondary | ICD-10-CM | POA: Diagnosis not present

## 2024-04-25 DIAGNOSIS — R0989 Other specified symptoms and signs involving the circulatory and respiratory systems: Secondary | ICD-10-CM | POA: Diagnosis not present

## 2024-04-25 DIAGNOSIS — I10 Essential (primary) hypertension: Secondary | ICD-10-CM | POA: Diagnosis not present

## 2024-04-25 DIAGNOSIS — J45909 Unspecified asthma, uncomplicated: Secondary | ICD-10-CM | POA: Diagnosis not present

## 2024-04-25 DIAGNOSIS — Z8249 Family history of ischemic heart disease and other diseases of the circulatory system: Secondary | ICD-10-CM | POA: Diagnosis not present

## 2024-04-25 LAB — CBC WITH DIFFERENTIAL/PLATELET
Abs Immature Granulocytes: 0.08 10*3/uL — ABNORMAL HIGH (ref 0.00–0.07)
Basophils Absolute: 0.1 10*3/uL (ref 0.0–0.1)
Basophils Relative: 0 %
Eosinophils Absolute: 0.1 10*3/uL (ref 0.0–0.5)
Eosinophils Relative: 1 %
HCT: 43.1 % (ref 39.0–52.0)
Hemoglobin: 14.2 g/dL (ref 13.0–17.0)
Immature Granulocytes: 0 %
Lymphocytes Relative: 22 %
Lymphs Abs: 4.2 10*3/uL — ABNORMAL HIGH (ref 0.7–4.0)
MCH: 32.4 pg (ref 26.0–34.0)
MCHC: 32.9 g/dL (ref 30.0–36.0)
MCV: 98.4 fL (ref 80.0–100.0)
Monocytes Absolute: 1.3 10*3/uL — ABNORMAL HIGH (ref 0.1–1.0)
Monocytes Relative: 7 %
Neutro Abs: 13.1 10*3/uL — ABNORMAL HIGH (ref 1.7–7.7)
Neutrophils Relative %: 70 %
Platelets: 294 10*3/uL (ref 150–400)
RBC: 4.38 MIL/uL (ref 4.22–5.81)
RDW: 14.1 % (ref 11.5–15.5)
WBC: 18.9 10*3/uL — ABNORMAL HIGH (ref 4.0–10.5)
nRBC: 0 % (ref 0.0–0.2)

## 2024-04-25 LAB — COMPREHENSIVE METABOLIC PANEL WITH GFR
ALT: 16 U/L (ref 0–44)
AST: 26 U/L (ref 15–41)
Albumin: 4.1 g/dL (ref 3.5–5.0)
Alkaline Phosphatase: 81 U/L (ref 38–126)
Anion gap: 13 (ref 5–15)
BUN: 15 mg/dL (ref 8–23)
CO2: 21 mmol/L — ABNORMAL LOW (ref 22–32)
Calcium: 9.2 mg/dL (ref 8.9–10.3)
Chloride: 104 mmol/L (ref 98–111)
Creatinine, Ser: 0.96 mg/dL (ref 0.61–1.24)
GFR, Estimated: >60 mL/min (ref >60)
Glucose, Bld: 204 mg/dL — ABNORMAL HIGH (ref 70–99)
Potassium: 3.6 mmol/L (ref 3.5–5.1)
Sodium: 138 mmol/L (ref 135–145)
Total Bilirubin: 1.7 mg/dL — ABNORMAL HIGH (ref 0.0–1.2)
Total Protein: 7.7 g/dL (ref 6.5–8.1)

## 2024-04-25 LAB — URINALYSIS, ROUTINE W REFLEX MICROSCOPIC
Bacteria, UA: NONE SEEN
Bilirubin Urine: NEGATIVE
Glucose, UA: >=500 mg/dL — AB
Hgb urine dipstick: NEGATIVE
Ketones, ur: 5 mg/dL — AB
Leukocytes,Ua: NEGATIVE
Nitrite: NEGATIVE
Protein, ur: NEGATIVE mg/dL
Specific Gravity, Urine: 1.012 (ref 1.005–1.030)
pH: 7 (ref 5.0–8.0)

## 2024-04-25 LAB — LIPASE, BLOOD: Lipase: 25 U/L (ref 11–51)

## 2024-04-25 MED ORDER — ONDANSETRON HCL 4 MG/2ML IJ SOLN
4.0000 mg | Freq: Once | INTRAMUSCULAR | Status: AC
  Administered 2024-04-25: 4 mg via INTRAVENOUS
  Filled 2024-04-25: qty 2

## 2024-04-25 MED ORDER — PANTOPRAZOLE SODIUM 20 MG PO TBEC: 20.0000 mg | DELAYED_RELEASE_TABLET | Freq: Every day | ORAL | 0 refills | Status: DC

## 2024-04-25 MED ORDER — FAMOTIDINE 20 MG PO TABS: 20.0000 mg | ORAL_TABLET | Freq: Two times a day (BID) | ORAL | 0 refills | Status: DC

## 2024-04-25 MED ORDER — MORPHINE SULFATE (PF) 4 MG/ML IV SOLN
4.0000 mg | Freq: Once | INTRAVENOUS | Status: AC
  Administered 2024-04-25: 4 mg via INTRAVENOUS
  Filled 2024-04-25: qty 1

## 2024-04-25 MED ORDER — IOHEXOL 350 MG/ML SOLN
75.0000 mL | Freq: Once | INTRAVENOUS | Status: AC | PRN
  Administered 2024-04-25: 75 mL via INTRAVENOUS

## 2024-04-25 MED ORDER — FAMOTIDINE IN NACL 20-0.9 MG/50ML-% IV SOLN
20.0000 mg | Freq: Once | INTRAVENOUS | Status: AC
  Administered 2024-04-25: 20 mg via INTRAVENOUS
  Filled 2024-04-25: qty 50

## 2024-04-25 NOTE — ED Triage Notes (Signed)
 RUQ since last night ~930pm after eating spaghetti.

## 2024-04-25 NOTE — ED Provider Notes (Signed)
 [place] EMERGENCY DEPARTMENT AT [facility] Provider Note   CSN: [id number] Arrival date & time: 04/25/24  1308     History  Chief Complaint  Patient presents with   Abdominal Pain    Benjamin Patton is a 73 y.o. male.  Patient with past medical history significant for hypertension, type II DM presents the emergency department complaining of right upper quadrant abdominal pain.  He states the pain began suddenly at 9:30 PM.  He has felt nauseated and has made himself vomit.  He also feels like he may have diarrhea but has not had any episodes as of yet.  The patient states symptoms began shortly after eating a spaghetti dinner.  He denies chest pain, shortness of breath, fever.   Abdominal Pain      Home Medications Prior to Admission medications   Medication Sig Start Date End Date Taking? Authorizing Provider  amLODipine -benazepril  (LOTREL) 10-20 MG capsule Take 1 capsule by mouth daily.    [provider]  Blood Glucose Monitoring Suppl (ACCU-CHEK GUIDE ME) w/Device KIT Use as directed four times per day E11.9 02/26/24   [name], MD  Cholecalciferol  50 MCG (2000 UT) TABS 1 tab by mouth once daily 01/09/22   John, James W, MD  glipiZIDE  (GLUCOTROL  XL) 10 MG 24 hr tablet Take 1 tablet (10 mg total) by mouth every morning. 02/26/24   [name], MD  glucose blood (ACCU-CHEK GUIDE TEST) test strip Use as instructed four times per day E11.9 02/26/24   [name], MD  insulin glargine  (LANTUS  SOLOSTAR) 100 UNIT/ML Solostar Pen Inject 20 Units into the skin daily. E11.9 02/26/24   [name], MD  Lancets MISC Use as directed four times per day E11.9 02/26/24   [name], MD  pioglitazone  (ACTOS ) 45 MG tablet Take 1 tablet (45 mg total) by mouth daily. 02/26/24   [name], MD  rosuvastatin  (CRESTOR ) 20 MG tablet Take 1 tablet (20 mg total) by mouth daily. 02/26/24   [name], MD      Allergies    Metformin  and related    Review of Systems    Review of Systems  Gastrointestinal:  Positive for abdominal pain.    Physical Exam Updated Vital Signs BP (!) 148/73 (BP Location: Left Arm)   Pulse 64   Temp (!) 97.5 F (36.4 C) (Oral)   Resp 19   Ht 5' 10.5" (1.791 m)   Wt 83.9 kg   SpO2 99%   BMI 26.17 kg/m  Physical Exam Vitals and nursing note reviewed.  Constitutional:      General: He is not in acute distress.    Appearance: He is well-developed.  HENT:     Head: Normocephalic and atraumatic.  Eyes:     Conjunctiva/sclera: Conjunctivae normal.  Cardiovascular:     Rate and Rhythm: Normal rate and regular rhythm.     Heart sounds: No murmur heard. Pulmonary:     Effort: Pulmonary effort is normal. No respiratory distress.     Breath sounds: Normal breath sounds.  Abdominal:     Palpations: Abdomen is soft.     Tenderness: There is abdominal tenderness in the right upper quadrant.  Musculoskeletal:        General: No swelling.     Cervical back: Neck supple.  Skin:    General: Skin is warm and dry.     Capillary Refill: Capillary refill takes less than 2 seconds.  Neurological:     Mental Status: He is alert.  Psychiatric:        Mood and Affect: Mood normal.     ED Results / Procedures / Treatments   Labs (all labs ordered are listed, but only abnormal results are displayed) Labs Reviewed  COMPREHENSIVE METABOLIC PANEL WITH GFR - Abnormal; Notable for the following components:      Result Value   CO2 21 (*)    Glucose, Bld 204 (*)    Total Bilirubin 1.7 (*)    All other components within normal limits  URINALYSIS, ROUTINE W REFLEX MICROSCOPIC - Abnormal; Notable for the following components:   Glucose, UA >=500 (*)    Ketones, ur 5 (*)    All other components within normal limits  CBC WITH DIFFERENTIAL/PLATELET - Abnormal; Notable for the following components:   WBC 18.9 (*)    Neutro Abs 13.1 (*)    Lymphs Abs 4.2 (*)    Monocytes Absolute 1.3 (*)    Abs Immature Granulocytes 0.08 (*)     All other components within normal limits  LIPASE, BLOOD    EKG EKG Interpretation Date/Time:  Saturday Apr 25 2024 01:54:00 EDT Ventricular Rate:  78 PR Interval:  171 QRS Duration:  98 QT Interval:  468 QTC Calculation: 534 R Axis:   62  Text Interpretation: Sinus arrhythmia Consider right atrial enlargement Abnormal inferior Q waves Prolonged QT interval No previous ECGs available Abnormal ECG Confirmed by Eldon Greenland ([id number]) on 04/25/2024 2:11:48 AM  Radiology CT ABDOMEN PELVIS W CONTRAST Result Date: 04/25/2024 CLINICAL DATA:  Nonlocalized abdominal pain. EXAM: CT ABDOMEN AND PELVIS WITH CONTRAST TECHNIQUE: Multidetector CT imaging of the abdomen and pelvis was performed using the standard protocol following bolus administration of intravenous contrast. RADIATION DOSE REDUCTION: This exam was performed according to the departmental dose-optimization program which includes automated exposure control, adjustment of the mA and/or kV according to patient size and/or use of iterative reconstruction technique. CONTRAST:  75mL OMNIPAQUE  IOHEXOL  350 MG/ML SOLN COMPARISON:  CT stone study 05/16/2022 FINDINGS: Lower chest: No acute findings. Hepatobiliary: No suspicious focal abnormality within the liver parenchyma. There is no evidence for gallstones, gallbladder wall thickening, or pericholecystic fluid. No intrahepatic or extrahepatic biliary dilation. Pancreas: No focal mass lesion. No dilatation of the main duct. No intraparenchymal cyst. No peripancreatic edema. Spleen: No focal abnormality. Adrenals/Urinary Tract: No adrenal nodule or mass. Multiple cysts noted in the kidneys including dominant 5.5 cm left lower pole simple cyst. No followup imaging is recommended. Tiny well-defined homogeneous low-density lesions in both kidneys are too small to characterize but are statistically most likely benign and probably cysts. No followup imaging is recommended. 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 The urinary bladder appears normal for the degree of distention. Stomach/Bowel: Small hiatal hernia. Stomach is distended with gas and fluid. Asymmetric wall thickening noted in the antral region on axial imaging (21/3), potentially related to peristalsis given appearance on sagittal imaging (61/7). Duodenum is normally positioned as is the ligament of Treitz. No small bowel wall thickening. No small bowel dilatation. The terminal ileum is normal. The appendix is normal. No gross colonic mass. No colonic wall thickening. Vascular/Lymphatic: No abdominal aortic aneurysm. No abdominal aortic atherosclerotic calcification. There is no gastrohepatic or hepatoduodenal ligament lymphadenopathy. No retroperitoneal or mesenteric lymphadenopathy. No pelvic sidewall lymphadenopathy. Reproductive: The prostate gland and seminal vesicles are unremarkable. Other: No intraperitoneal free fluid. Musculoskeletal: Posttraumatic  deformity noted in the superior inferior pubic rami. No worrisome lytic or sclerotic osseous abnormality. IMPRESSION: 1. Asymmetric wall thickening in the antral region the stomach. Based on sagittal imaging, this may be within normal limits, but mucosal lesion is not excluded. GI follow-up recommended and upper endoscopy may be warranted. 2. Otherwise, no acute findings in the abdomen or pelvis. Specifically, no findings to explain the patient's history of abdominal pain. 3. Small hiatal hernia. 4. 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 No hydronephrosis. Electronically Signed   By: Donnal Fusi M.D.   On: 04/25/2024 05:53   US  Abdomen Limited RUQ (LIVER/GB) Result Date: 04/25/2024 CLINICAL DATA:  73 year old male with right upper quadrant abdominal pain. EXAM: ULTRASOUND ABDOMEN LIMITED RIGHT UPPER QUADRANT COMPARISON:  CT Abdomen and Pelvis 05/16/2022.  FINDINGS: Gallbladder: The gallbladder appears more distended than on the prior CT with layering sludge (image 3). Wall thickness is at the upper limits of normal, 2-3 mm. No shadowing echogenic stones identified. No sonographic Murphy sign elicited. Common bile duct: Diameter: 5-6 mm, normal. Liver: Liver echogenicity probably within normal limits (increased renal echogenicity suspected on image 39). No discrete liver lesion. Portal vein is patent on color Doppler imaging with normal direction of blood flow towards the liver. Other: Echogenic right renal cortex. Otherwise negative visible right kidney. No free fluid. IMPRESSION: 1. Gallbladder distended with sludge, but no cholelithiasis identified, and no ultrasound evidence of acute cholecystitis. No evidence of bile duct obstruction. 2. Evidence of right kidney chronic medical renal disease. Electronically Signed   By: Marlise Simpers M.D.   On: 04/25/2024 04:08    Procedures Procedures    Medications Ordered in ED Medications  famotidine (PEPCID) IVPB 20 mg premix (has no administration in time range)  morphine (PF) 4 MG/ML injection 4 mg (4 mg Intravenous Given 04/25/24 0206)  ondansetron (ZOFRAN) injection 4 mg (4 mg Intravenous Given 04/25/24 0216)  iohexol  (OMNIPAQUE ) 350 MG/ML injection 75 mL (75 mLs Intravenous Contrast Given 04/25/24 0500)    ED Course/ Medical Decision Making/ A&P                                 Medical Decision Making Amount and/or Complexity of Data Reviewed Labs: ordered. Radiology: ordered. ECG/medicine tests: ordered.  Risk Prescription drug management.   This patient presents to the ED for concern of abdominal pain, this involves an extensive number of treatment options, and is a complaint that carries with it a high risk of complications and morbidity.  The differential diagnosis includes cholecystitis, gastritis, pancreatitis, appendicitis, GERD, others   Co morbidities / Chronic conditions that complicate  the patient evaluation  Type II DM, hypertension   Additional history obtained:  Additional history obtained from EMR External records from outside source obtained and reviewed including primary care notes   Lab Tests:  I Ordered, and personally interpreted labs.  The pertinent results include: Leukocytosis with a white count of 18,900   Imaging Studies ordered:  I ordered imaging studies including right upper quadrant ultrasound, CT abdomen pelvis with contrast I independently visualized and interpreted imaging which showed  1. Gallbladder distended with sludge, but no cholelithiasis  identified, and no ultrasound evidence of acute cholecystitis. No  evidence of bile duct obstruction.  2. Evidence of right kidney chronic medical renal disease.   1. Asymmetric wall thickening in the antral region the stomach.  Based on  sagittal imaging, this may be within normal limits, but  mucosal lesion is not excluded. GI follow-up recommended and upper  endoscopy may be warranted.  2. Otherwise, no acute findings in the abdomen or pelvis.  Specifically, no findings to explain the patient's history of  abdominal pain.  3. Small hiatal hernia.  4. Duplicated left intrarenal collecting system with at least  partial duplication of the left ureter. No hydronephrosis.   I agree with the radiologist interpretation   Cardiac Monitoring: / EKG:  The patient was maintained on a cardiac monitor.  I personally viewed and interpreted the cardiac monitored which showed an underlying rhythm of: Sinus arrhythmia   Problem List / ED Course / Critical interventions / Medication management   I ordered medication including morphine, pepcid, and Zofran Reevaluation of the patient after these medicines showed that the patient improved I have reviewed the patients home medicines and have made adjustments as needed   Social Determinants of Health:  Social determinants of health were considered but  were not a significant factor during this encounter   Test / Admission - Considered:  Patient with possible wall thickening of the antrum on CT. No cholecystitis, appendicitis or other acute surgical abnormality on CT scan.  Patient feeling better after medications.  Plan to have patient follow-up with GI for further evaluation. Prescriptions sent for pepcid and protonix. Patient stable for discharge home at this time. Return precautions provided.          Final Clinical Impression(s) / ED Diagnoses Final diagnoses:  Generalized abdominal pain    Rx / DC Orders ED Discharge Orders     None         Delories Fetter 04/25/24 1610    Eldon Greenland, MD 04/26/24 0401

## 2024-04-25 NOTE — Discharge Instructions (Addendum)
 Your workup tonight was reassuring overall. There were some signs of some possible thickening of your stomach lining. You should schedule a follow up with gastroenterology for further evaluation. I have sent some prescriptions to your pharmacy to help with your abdominal pain. If you develop any life threatening symptoms please return to the emergency department.

## 2024-04-25 NOTE — Telephone Encounter (Signed)
 Patient's daughter called concerning prescriptions patient was supposed to receive on discharge that were not received at his pharmacy.  ED RNCM sent message to EDP and prescription was sent to patient's pharmacy. Updated patient's daughter.  No further ED RNCM needs identified.

## 2024-04-25 NOTE — Telephone Encounter (Cosign Needed)
 Rx to pharmacy

## 2024-04-27 ENCOUNTER — Other Ambulatory Visit: Payer: Self-pay

## 2024-04-27 ENCOUNTER — Telehealth: Payer: Self-pay | Admitting: Internal Medicine

## 2024-04-27 ENCOUNTER — Emergency Department (HOSPITAL_COMMUNITY)

## 2024-04-27 ENCOUNTER — Encounter (HOSPITAL_COMMUNITY): Payer: Self-pay

## 2024-04-27 ENCOUNTER — Ambulatory Visit (INDEPENDENT_AMBULATORY_CARE_PROVIDER_SITE_OTHER): Admitting: Family Medicine

## 2024-04-27 ENCOUNTER — Inpatient Hospital Stay (HOSPITAL_COMMUNITY)
Admission: EM | Admit: 2024-04-27 | Discharge: 2024-05-02 | DRG: 854 | Disposition: A | Discharge status: Home / Self Care | Attending: Internal Medicine | Admitting: Internal Medicine

## 2024-04-27 VITALS — BP 138/84 | Ht 70.5 in

## 2024-04-27 DIAGNOSIS — R17 Unspecified jaundice: Secondary | ICD-10-CM | POA: Diagnosis not present

## 2024-04-27 DIAGNOSIS — A419 Sepsis, unspecified organism: Secondary | ICD-10-CM | POA: Diagnosis not present

## 2024-04-27 DIAGNOSIS — R0989 Other specified symptoms and signs involving the circulatory and respiratory systems: Secondary | ICD-10-CM | POA: Diagnosis not present

## 2024-04-27 DIAGNOSIS — I38 Endocarditis, valve unspecified: Secondary | ICD-10-CM

## 2024-04-27 DIAGNOSIS — K838 Other specified diseases of biliary tract: Secondary | ICD-10-CM | POA: Diagnosis not present

## 2024-04-27 DIAGNOSIS — Z79899 Other long term (current) drug therapy: Secondary | ICD-10-CM

## 2024-04-27 DIAGNOSIS — E860 Dehydration: Secondary | ICD-10-CM | POA: Diagnosis present

## 2024-04-27 DIAGNOSIS — I10 Essential (primary) hypertension: Secondary | ICD-10-CM | POA: Diagnosis present

## 2024-04-27 DIAGNOSIS — R1084 Generalized abdominal pain: Secondary | ICD-10-CM | POA: Diagnosis not present

## 2024-04-27 DIAGNOSIS — E1165 Type 2 diabetes mellitus with hyperglycemia: Secondary | ICD-10-CM | POA: Diagnosis present

## 2024-04-27 DIAGNOSIS — Z9081 Acquired absence of spleen: Secondary | ICD-10-CM

## 2024-04-27 DIAGNOSIS — Z888 Allergy status to other drugs, medicaments and biological substances status: Secondary | ICD-10-CM

## 2024-04-27 DIAGNOSIS — K810 Acute cholecystitis: Secondary | ICD-10-CM | POA: Diagnosis present

## 2024-04-27 DIAGNOSIS — Z9049 Acquired absence of other specified parts of digestive tract: Secondary | ICD-10-CM

## 2024-04-27 DIAGNOSIS — R0602 Shortness of breath: Secondary | ICD-10-CM

## 2024-04-27 DIAGNOSIS — R011 Cardiac murmur, unspecified: Secondary | ICD-10-CM | POA: Diagnosis present

## 2024-04-27 DIAGNOSIS — R000 Tachycardia, unspecified: Secondary | ICD-10-CM | POA: Diagnosis not present

## 2024-04-27 DIAGNOSIS — J9811 Atelectasis: Secondary | ICD-10-CM | POA: Diagnosis present

## 2024-04-27 DIAGNOSIS — Z83719 Family history of colon polyps, unspecified: Secondary | ICD-10-CM

## 2024-04-27 DIAGNOSIS — E7800 Pure hypercholesterolemia, unspecified: Secondary | ICD-10-CM | POA: Diagnosis present

## 2024-04-27 DIAGNOSIS — Z8249 Family history of ischemic heart disease and other diseases of the circulatory system: Secondary | ICD-10-CM

## 2024-04-27 DIAGNOSIS — K82A1 Gangrene of gallbladder in cholecystitis: Secondary | ICD-10-CM | POA: Diagnosis present

## 2024-04-27 DIAGNOSIS — K828 Other specified diseases of gallbladder: Principal | ICD-10-CM

## 2024-04-27 DIAGNOSIS — J45909 Unspecified asthma, uncomplicated: Secondary | ICD-10-CM | POA: Diagnosis present

## 2024-04-27 DIAGNOSIS — N179 Acute kidney failure, unspecified: Secondary | ICD-10-CM | POA: Diagnosis present

## 2024-04-27 DIAGNOSIS — D72828 Other elevated white blood cell count: Secondary | ICD-10-CM | POA: Diagnosis not present

## 2024-04-27 DIAGNOSIS — K219 Gastro-esophageal reflux disease without esophagitis: Secondary | ICD-10-CM | POA: Diagnosis present

## 2024-04-27 DIAGNOSIS — K760 Fatty (change of) liver, not elsewhere classified: Secondary | ICD-10-CM | POA: Diagnosis not present

## 2024-04-27 DIAGNOSIS — Z7984 Long term (current) use of oral hypoglycemic drugs: Secondary | ICD-10-CM

## 2024-04-27 DIAGNOSIS — K449 Diaphragmatic hernia without obstruction or gangrene: Secondary | ICD-10-CM | POA: Diagnosis present

## 2024-04-27 DIAGNOSIS — Z794 Long term (current) use of insulin: Secondary | ICD-10-CM

## 2024-04-27 DIAGNOSIS — E872 Acidosis, unspecified: Secondary | ICD-10-CM | POA: Diagnosis present

## 2024-04-27 DIAGNOSIS — Z87442 Personal history of urinary calculi: Secondary | ICD-10-CM

## 2024-04-27 DIAGNOSIS — K658 Other peritonitis: Secondary | ICD-10-CM | POA: Diagnosis present

## 2024-04-27 DIAGNOSIS — R6520 Severe sepsis without septic shock: Secondary | ICD-10-CM | POA: Diagnosis not present

## 2024-04-27 LAB — COMPREHENSIVE METABOLIC PANEL WITH GFR
ALT: 30 U/L (ref 0–44)
AST: 35 U/L (ref 15–41)
Albumin: 3.4 g/dL — ABNORMAL LOW (ref 3.5–5.0)
Alkaline Phosphatase: 87 U/L (ref 38–126)
Anion gap: 14 (ref 5–15)
BUN: 21 mg/dL (ref 8–23)
CO2: 22 mmol/L (ref 22–32)
Calcium: 9.3 mg/dL (ref 8.9–10.3)
Chloride: 99 mmol/L (ref 98–111)
Creatinine, Ser: 1.4 mg/dL — ABNORMAL HIGH (ref 0.61–1.24)
GFR, Estimated: 54 mL/min — ABNORMAL LOW (ref >60)
Glucose, Bld: 228 mg/dL — ABNORMAL HIGH (ref 70–99)
Potassium: 4 mmol/L (ref 3.5–5.1)
Sodium: 135 mmol/L (ref 135–145)
Total Bilirubin: 4.9 mg/dL — ABNORMAL HIGH (ref 0.0–1.2)
Total Protein: 8.2 g/dL — ABNORMAL HIGH (ref 6.5–8.1)

## 2024-04-27 LAB — CBC
HCT: 45.5 % (ref 39.0–52.0)
Hemoglobin: 15.4 g/dL (ref 13.0–17.0)
MCH: 33 pg (ref 26.0–34.0)
MCHC: 33.8 g/dL (ref 30.0–36.0)
MCV: 97.6 fL (ref 80.0–100.0)
Platelets: 307 10*3/uL (ref 150–400)
RBC: 4.66 MIL/uL (ref 4.22–5.81)
RDW: 14.2 % (ref 11.5–15.5)
WBC: 21.5 10*3/uL — ABNORMAL HIGH (ref 4.0–10.5)
nRBC: 0 % (ref 0.0–0.2)

## 2024-04-27 LAB — URINALYSIS, ROUTINE W REFLEX MICROSCOPIC
Bilirubin Urine: NEGATIVE
Glucose, UA: >=500 mg/dL — AB
Ketones, ur: 5 mg/dL — AB
Leukocytes,Ua: NEGATIVE
Nitrite: NEGATIVE
Protein, ur: 100 mg/dL — AB
Specific Gravity, Urine: 1.021 (ref 1.005–1.030)
pH: 5 (ref 5.0–8.0)

## 2024-04-27 LAB — I-STAT CG4 LACTIC ACID, ED: Lactic Acid, Venous: 2.4 mmol/L (ref 0.5–1.9)

## 2024-04-27 LAB — TROPONIN I (HIGH SENSITIVITY)
Troponin I (High Sensitivity): 19 ng/L — ABNORMAL HIGH (ref <18)
Troponin I (High Sensitivity): 26 ng/L — ABNORMAL HIGH (ref <18)

## 2024-04-27 LAB — LIPASE, BLOOD: Lipase: 21 U/L (ref 11–51)

## 2024-04-27 MED ORDER — SODIUM CHLORIDE 0.9 % IV SOLN
2.0000 g | Freq: Once | INTRAVENOUS | Status: AC
  Administered 2024-04-27: 2 g via INTRAVENOUS
  Filled 2024-04-27: qty 20

## 2024-04-27 MED ORDER — SODIUM CHLORIDE 0.9 % IV BOLUS
1000.0000 mL | Freq: Once | INTRAVENOUS | Status: AC
Start: 2024-04-27 — End: 2024-04-28
  Administered 2024-04-27: 1000 mL via INTRAVENOUS

## 2024-04-27 NOTE — Telephone Encounter (Signed)
 I have spoken to [facility] who states that Mr.Dutan recently went to the emergency room and has continued with intense abdominal pain. Per review of ER records, patient did have an abnormal CT which requires GI follow up. Appointment has been scheduled with Suzanna Erp, PA-C for tomorrow, 04/28/24. Sheena verbalizes understanding of this information.   I have advised that if patient has worsening abdominal pain, accompanying nausea/vomiting/fever, he should return to the emergency room.

## 2024-04-27 NOTE — Patient Instructions (Addendum)
 Tachycardia, abdominal pain, increased work of breathing.  WBC 18.9 on 04/25/24.   Concern for Acute Abdomen

## 2024-04-27 NOTE — ED Provider Triage Note (Signed)
 Emergency Medicine Provider Triage Evaluation Note  KRISS ISHLER , a 73 y.o. male  was evaluated in triage.  Pt complains of right upper quadrant abdominal pain, shortness of breath, seen on 5/31, with gallbladder sludge and stomach inflammation, told to follow-up with GI.  Symptoms worsened since that especially with shortness of breath.  Pain is 8/10.  No appetite. Elevated temp in triage.  Review of Systems  Positive: Shob, abdominal pain Negative:   Physical Exam  BP (!) 136/94 (BP Location: Right Arm)   Pulse (!) 123   Temp 99.6 F (37.6 C)   Resp (!) 26   Ht 5' 10.5" (1.791 m)   Wt 82.1 kg   SpO2 97%   BMI 25.60 kg/m  Gen:   Awake, no distress   Resp:  Normal effort  MSK:   Moves extremities without difficulty  Other:  Focal ttp in RUQ, positive murphy sign, increased work of breathing without wheezing, tachypnea, tachycardia  Medical Decision Making  Medically screening exam initiated at 4:30 PM.  Appropriate orders placed.  HOGAN HOOBLER was informed that the remainder of the evaluation will be completed by another provider, this initial triage assessment does not replace that evaluation, and the importance of remaining in the ED until their evaluation is complete.  Workup initiated in triage    Nelly Banco, [place] 04/27/24 1631

## 2024-04-27 NOTE — ED Triage Notes (Signed)
 Pt reports shortness of breath and RUQ abd pain. He was seen here on 5/31 for both of these same symptoms and was told to follow up with GI. He reports he feels short of breath when he feels the pain.

## 2024-04-27 NOTE — ED Provider Notes (Addendum)
 [name] Provider Note   CSN: [id number] Arrival date & time: 04/27/24  1535     History  Chief Complaint  Patient presents with   Shortness of Breath    Benjamin Patton is a 73 y.o. male with medical history to include asthma, hypertension, low back pain, diabetes, splenectomy.  Patient presents to ED for evaluation of right upper quadrant abdominal pain.  Reports this pain began on 5/30.  He was seen in the ED the next day on 5/31.  Patient reports at this visit he had an ultrasound which showed gallbladder distention with sludge but no cholelithiasis or cholecystitis.  Patient also had CT scan which showed asymmetric wall thickening in the antral region of the stomach.  Radiology believe that based on sagittal imaging this may be within normal limits but mucosal lesion is not excluded.  They recommended GI follow-up and upper endoscopy as warranted.  Patient reports that they tried to follow-up with Dr. Elvin Hammer but were unable to be seen until July.  Patient returns to ED tonight complaining of increased pain, shortness of breath.  He reports that his shortness of breath is only present when he takes deep breaths.  He states that the pain in his abdomen will cause him to stop inspiring.  He denies any chest pain.  Denies lightheadedness, dizziness or weakness.  He denies nausea, vomiting or diarrhea.  He reports he last had a bowel movement 4 to 5 days ago.  He denies any known fevers at home but does have an elevated temperature here.  He is tachycardic.  He denies any dysuria.  Denies alcohol use, ibuprofen use.  Reports pain began on 5/30 after eating spaghetti dinner.  Shortness of Breath Associated symptoms: abdominal pain   Associated symptoms: no fever        Home Medications Prior to Admission medications   Medication Sig Start Date End Date Taking? Authorizing Provider  amLODipine -benazepril  (LOTREL) 10-20 MG capsule Take 1 capsule  by mouth daily.    [provider]  Blood Glucose Monitoring Suppl (ACCU-CHEK GUIDE ME) w/Device KIT Use as directed four times per day E11.9 02/26/24   [name], MD  Cholecalciferol  50 MCG (2000 UT) TABS 1 tab by mouth once daily 01/09/22   [name], MD  famotidine  (PEPCID ) 20 MG tablet Take 1 tablet (20 mg total) by mouth 2 (two) times daily. 04/25/24   [name], PA-C  glipiZIDE  (GLUCOTROL  XL) 10 MG 24 hr tablet Take 1 tablet (10 mg total) by mouth every morning. 02/26/24   [name], MD  glucose blood (ACCU-CHEK GUIDE TEST) test strip Use as instructed four times per day E11.9 02/26/24   [name], MD  insulin  glargine (LANTUS  SOLOSTAR) 100 UNIT/ML Solostar Pen Inject 20 Units into the skin daily. E11.9 02/26/24   [name], MD  Lancets MISC Use as directed four times per day E11.9 02/26/24   [name], MD  pantoprazole  (PROTONIX ) 20 MG tablet Take 1 tablet (20 mg total) by mouth daily. 04/25/24   [name], [name], PA-C  pioglitazone  (ACTOS ) 45 MG tablet Take 1 tablet (45 mg total) by mouth daily. 02/26/24   [name], MD  rosuvastatin  (CRESTOR ) 20 MG tablet Take 1 tablet (20 mg total) by mouth daily. 02/26/24   [name], MD      Allergies    Metformin  and related    Review of Systems  Review of Systems  Constitutional:  Negative for fever.  Respiratory:  Positive for shortness of breath.   Gastrointestinal:  Positive for abdominal pain.    Physical Exam Updated Vital Signs BP (!) 147/86   Pulse 99   Temp 99 F (37.2 C) (Oral)   Resp (!) 28   Ht 5' 10.5" (1.791 m)   Wt 82.1 kg   SpO2 96%   BMI 25.60 kg/m  Physical Exam Vitals and nursing note reviewed.  Constitutional:      General: He is not in acute distress.    Appearance: He is well-developed.  HENT:     Head: Normocephalic and atraumatic.  Eyes:     Conjunctiva/sclera: Conjunctivae normal.  Cardiovascular:     Rate and Rhythm: Regular rhythm. Tachycardia present.     Heart  sounds: No murmur heard. Pulmonary:     Effort: Pulmonary effort is normal. No respiratory distress.     Breath sounds: Normal breath sounds.  Abdominal:     Palpations: Abdomen is soft.     Tenderness: There is abdominal tenderness.     Comments: RUQ TTP.  No rebound or guarding present.  No overlying skin change.  No CVA tenderness bilaterally.  Positive Murphy sign.  Musculoskeletal:        General: No swelling.     Cervical back: Neck supple.  Skin:    General: Skin is warm and dry.     Capillary Refill: Capillary refill takes less than 2 seconds.  Neurological:     Mental Status: He is alert and oriented to person, place, and time. Mental status is at baseline.  Psychiatric:        Mood and Affect: Mood normal.     ED Results / Procedures / Treatments   Labs (all labs ordered are listed, but only abnormal results are displayed) Labs Reviewed  CBC - Abnormal; Notable for the following components:      Result Value   WBC 21.5 (*)    All other components within normal limits  COMPREHENSIVE METABOLIC PANEL WITH GFR - Abnormal; Notable for the following components:   Glucose, Bld 228 (*)    Creatinine, Ser 1.40 (*)    Total Protein 8.2 (*)    Albumin 3.4 (*)    Total Bilirubin 4.9 (*)    GFR, Estimated 54 (*)    All other components within normal limits  URINALYSIS, ROUTINE W REFLEX MICROSCOPIC - Abnormal; Notable for the following components:   Color, Urine AMBER (*)    APPearance HAZY (*)    Glucose, UA >=500 (*)    Hgb urine dipstick SMALL (*)    Ketones, ur 5 (*)    Protein, ur 100 (*)    Bacteria, UA RARE (*)    All other components within normal limits  I-STAT CG4 LACTIC ACID, ED - Abnormal; Notable for the following components:   Lactic Acid, Venous 2.4 (*)    All other components within normal limits  CBG MONITORING, ED - Abnormal; Notable for the following components:   Glucose-Capillary 196 (*)    All other components within normal limits  CBG MONITORING,  ED - Abnormal; Notable for the following components:   Glucose-Capillary 200 (*)    All other components within normal limits  TROPONIN I (HIGH SENSITIVITY) - Abnormal; Notable for the following components:   Troponin I (High Sensitivity) 19 (*)    All other components within normal limits  TROPONIN I (HIGH SENSITIVITY) - Abnormal; Notable for  the following components:   Troponin I (High Sensitivity) 26 (*)    All other components within normal limits  CULTURE, BLOOD (ROUTINE X 2)  CULTURE, BLOOD (ROUTINE X 2)  LIPASE, BLOOD  COMPREHENSIVE METABOLIC PANEL WITH GFR  CBC  BILIRUBIN, DIRECT  I-STAT CG4 LACTIC ACID, ED    EKG EKG Interpretation Date/Time:  Monday April 27 2024 16:58:31 EDT Ventricular Rate:  123 PR Interval:  146 QRS Duration:  82 QT Interval:  312 QTC Calculation: 446 R Axis:   70  Text Interpretation: Sinus tachycardia with Premature atrial complexes Possible Inferior infarct , age undetermined Abnormal ECG When compared with ECG of 25-Apr-2024 01:54, PREVIOUS ECG IS PRESENT Confirmed by Florentino Hurdle [phone]) on 04/27/2024 10:43:51 PM  Radiology US  Abdomen Limited RUQ (LIVER/GB) Result Date: 04/27/2024 CLINICAL DATA:  151470 RUQ abdominal pain 151470 EXAM: ULTRASOUND ABDOMEN LIMITED RIGHT UPPER QUADRANT COMPARISON:  04/25/2024. FINDINGS: Gallbladder: Wall thickening. Echogenic sludge. No pericholecystic fluid. No shadowing stones. Common bile duct: Diameter: 0.4 cm. Liver: Liver is hyperechoic consistent with fatty infiltration. No focal hepatic lesions identified. No intrahepatic ductal dilatation. Hepatopetal portal vein flow. IMPRESSION: Gallbladder sludge and wall thickening. Fatty infiltration of the liver. Electronically Signed   By: Sydell Eva M.D.   On: 04/27/2024 19:16   DG Chest 1 View Result Date: 04/27/2024 CLINICAL DATA:  141880 SOB (shortness of breath) 141880 EXAM: CHEST  1 VIEW COMPARISON:  November 08, 2020 FINDINGS: Low lung volumes with streaky  bibasilar atelectasis. No lobar consolidation, pleural effusion, or pneumothorax. No cardiomegaly. No acute fracture or destructive lesion. Multilevel thoracic osteophytosis. IMPRESSION: Low lung volumes with streaky bibasilar atelectasis. Electronically Signed   By: Rance Burrows M.D.   On: 04/27/2024 18:14    Procedures .Critical Care  Performed by: Adel Aden, PA-C Authorized by: Adel Aden, PA-C   Critical care provider statement:    Critical care time (minutes):  55   Critical care time was exclusive of:  Separately billable procedures and treating other patients and teaching time   Critical care was necessary to treat or prevent imminent or life-threatening deterioration of the following conditions:  Sepsis   Critical care was time spent personally by me on the following activities:  Blood draw for specimens, development of treatment plan with patient or surrogate, discussions with consultants, discussions with primary provider, evaluation of patient's response to treatment, examination of patient, interpretation of cardiac output measurements, obtaining history from patient or surrogate, review of old charts, re-evaluation of patient's condition, pulse oximetry, ordering and review of radiographic studies, ordering and review of laboratory studies and ordering and performing treatments and interventions   I assumed direction of critical care for this patient from another provider in my specialty: no     Care discussed with: admitting provider      Medications Ordered in ED Medications  enoxaparin  (LOVENOX ) injection 40 mg (40 mg Subcutaneous Not Given 04/28/24 0158)  acetaminophen  (TYLENOL ) tablet 650 mg (has no administration in time range)    Or  acetaminophen  (TYLENOL ) suppository 650 mg (has no administration in time range)  ondansetron  (ZOFRAN ) tablet 4 mg (has no administration in time range)    Or  ondansetron  (ZOFRAN ) injection 4 mg (has no administration  in time range)  insulin  aspart (novoLOG ) injection 0-15 Units ( Subcutaneous Not Given 04/28/24 0301)  0.9 %  sodium chloride  infusion ( Intravenous New Bag/Given 04/28/24 0215)  senna-docusate (Senokot-S) tablet 1 tablet (1 tablet Oral Not Given 04/28/24 0158)  HYDROmorphone  (  DILAUDID ) injection 0.5 mg (has no administration in time range)  cefTRIAXone  (ROCEPHIN ) 2 g in sodium chloride  0.9 % 100 mL IVPB (has no administration in time range)  polyethylene glycol (MIRALAX  / GLYCOLAX ) packet 17 g (has no administration in time range)  sodium chloride  0.9 % bolus 1,000 mL (0 mLs Intravenous Stopped 04/28/24 0007)  cefTRIAXone  (ROCEPHIN ) 2 g in sodium chloride  0.9 % 100 mL IVPB (0 g Intravenous Stopped 04/27/24 2330)    ED Course/ Medical Decision Making/ A&P Clinical Course as of 04/28/24 0615  Mon Apr 27, 2024  2244 Highly doubt pe, probably splinting due to pain. Will get third trop and dimer if you want.  [CG]  2245 CT IMPRESSION: 1. Asymmetric wall thickening in the antral region the stomach. Based on sagittal imaging, this may be within normal limits, but mucosal lesion is not excluded. GI follow-up recommended and upper endoscopy may be warranted. 2. Otherwise, no acute findings in the abdomen or pelvis. Specifically, no findings to explain the patient's history of abdominal pain. 3. Small hiatal hernia. 4. 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 No hydronephrosis.  [CG]  2246 US  5/31 Gallbladder distended with sludge, but no cholelithiasis identified, and no ultrasound evidence of acute cholecystitis. No  [CG]  2246 Us  today Gallbladder sludge and wall thickening. Fatty infiltration of the liver.  [CG]  2246 Bili jump from 1.7 to 4.9 [CG]  2249 Repeat CMP labs tomorrow, rocephin , admit to medicine. Metzger [CG]    Clinical Course User Index [CG] Adel Aden, PA-C   Medical Decision Making Risk Decision regarding  hospitalization.   73 year old male presents for evaluation.  Please see HPI for further details.  On examination patient is tachycardic, temperature 99.3 oral.  Lung sounds are clear bilaterally, not hypoxic.  Abdomen with TTP of right upper quadrant and a positive Murphy sign.  No overlying skin change.  Neurological examination baseline.  Patient lab work initiated in triage include CBC, CMP, troponin, urinalysis, lipase.  Ultrasound of right upper quadrant as well as chest x-ray also collected.  I have added on lactic and blood culture.  CBC with leukocytosis to 21.5.  3 days ago 18.9.  No anemia.  Metabolic panel with glucose 228, creatinine 1.4 which is elevated from 3 days ago where creatinine was 0.96.  Patient has AKI.  Urinalysis shows glucose, hemoglobin, ketones and protein.  Lipase 21.  Troponin 19, delta 26.  Ultrasound imaging shows gallbladder sludge and wall thickening.  Chest x-ray unremarkable.  Question cholecystitis.  Lactic acid elevated at 2.4.  Given liter of fluid and on recheck of lactic acid decreased to 1.3.  Patient labs and imaging raise concern for cholecystitis.  No evidence of acute cholecystitis on ultrasound imaging however patient does fit SIRS criteria with tachycardia, elevated lactic acid, source.  Patient also with AKI.  Discussed with Dr. Davonna Estes of general surgery.  He has requested medicine admission, repeat CMP in the morning and they will consult on the patient for possible cholecystectomy.  Patient was placed on Rocephin .  Discussed with Dr. Yvonne Hering of [facility] hospitalist service.  Hospitalist has agreed to admit the patient.  The patient is amenable to plan.  Stable on admission.  Final Clinical Impression(s) / ED Diagnoses Final diagnoses:  Thickening of wall of gallbladder  Sepsis, due to unspecified organism, unspecified whether acute organ dysfunction present (HCC)  AKI (acute kidney injury) (HCC)    Rx / DC Orders ED Discharge  Orders      None           Kristin Peyer 04/28/24 4098    Flonnie Humphrey, DO 05/02/24 1548

## 2024-04-27 NOTE — Telephone Encounter (Signed)
 PT and his daughter Carron Clap are calling about an appointment with Dr. Elvin Hammer. He was recently admitted to ED for abdominal pain on 5/31. He is still in a lot of pain and scheduled for a colonoscopy on 6/18. They would like to know if he can be seen before that or if Dr. Elvin Hammer would like to proceed with procedure. Please call Sheena at [phone] per patient's request.

## 2024-04-27 NOTE — Progress Notes (Signed)
 Acute Office Visit  Subjective:     Patient ID: Benjamin Patton, male    DOB: [birth year], 73 y.o.   MRN: [id number]  Chief Complaint  Patient presents with   Hospitalization Follow-up    Still having abdominal pains, no bowel movement in about 5 days, denies N/V. New SOB, started when the abdominal pains. Abdominal pains started on 05/28    HPI Patient is in today for evaluation of right upper quadrant abdominal pain. Reports has not had a bowel movement in about 5 days. Was seen in the ED 2 days ago with gallbladder wall thickening and sludge on ultrasound, CT unremarkable, white count 18.9, bilirubin 1.9.  No antibiotic treatment. ER notes and results reviewed by me. Reports increased pain, abdominal swelling, now having shortness of breath and increased heart rate. Denies nausea vomiting, fever, diarrhea, other symptoms today.  ROS Per HPI      Objective:    BP 138/84 (BP Location: Left Arm, Patient Position: Sitting) Comment: [place] (Student) reading  Ht 5' 10.5" (1.791 m)   SpO2 92%   BMI 26.17 kg/m    Physical Exam Constitutional:      Appearance: He is ill-appearing.  HENT:     Head: Normocephalic and atraumatic.  Cardiovascular:     Rate and Rhythm: Tachycardia present.  Pulmonary:     Effort: No respiratory distress.     Breath sounds: No wheezing.     Comments: Tachypnea Abdominal:     General: Abdomen is protuberant. Bowel sounds are normal. There is distension.     Tenderness: There is generalized abdominal tenderness and tenderness in the right upper quadrant. Positive signs include Murphy's sign. Negative signs include McBurney's sign.     Hernia: No hernia is present.       Comments: Area of swelling to the right abdomen, most exquisitely tender here.  Generalized tenderness, positive Murphy sign  Musculoskeletal:     Cervical back: Normal range of motion.  Skin:    Coloration: Skin is pale.  Neurological:     Mental Status: He is alert.    EKG: Indication: SOB, tachycardia Rate: 127 Interpretation: Sinus Tachycardia Changes from previous: yes, last EKG 04/25/24 w/ sinus arrhythmia, rate 78   Results for orders placed or performed during the hospital encounter of 04/27/24  Blood culture (routine x 2)   Specimen: BLOOD  Result Value Ref Range   Specimen Description BLOOD SITE NOT SPECIFIED    Special Requests      BOTTLES DRAWN AEROBIC AND ANAEROBIC Blood Culture results may not be optimal due to an inadequate volume of blood received in culture bottles   Culture      NO GROWTH < 12 HOURS Performed at [facility], [address], [place], [place] [address]    Report Status PENDING   Blood culture (routine x 2)   Specimen: BLOOD  Result Value Ref Range   Specimen Description BLOOD SITE NOT SPECIFIED    Special Requests      BOTTLES DRAWN AEROBIC AND ANAEROBIC Blood Culture results may not be optimal due to an inadequate volume of blood received in culture bottles   Culture      NO GROWTH < 12 HOURS Performed at [facility], [address] [place], [place] [address]    Report Status PENDING   CBC  Result Value Ref Range   WBC 21.5 (H) 4.0 - 10.5 K/uL   RBC 4.66 4.22 - 5.81 MIL/uL  Hemoglobin 15.4 13.0 - 17.0 g/dL   HCT 16.1 09.6 - 04.5 %   MCV 97.6 80.0 - 100.0 fL   MCH 33.0 26.0 - 34.0 pg   MCHC 33.8 30.0 - 36.0 g/dL   RDW 40.9 81.1 - 91.4 %   Platelets 307 150 - 400 K/uL   nRBC 0.0 0.0 - 0.2 %  Comprehensive metabolic panel  Result Value Ref Range   Sodium 135 135 - 145 mmol/L   Potassium 4.0 3.5 - 5.1 mmol/L   Chloride 99 98 - 111 mmol/L   CO2 22 22 - 32 mmol/L   Glucose, Bld 228 (H) 70 - 99 mg/dL   BUN 21 8 - 23 mg/dL   Creatinine, Ser 7.82 (H) 0.61 - 1.24 mg/dL   Calcium  9.3 8.9 - 10.3 mg/dL   Total Protein 8.2 (H) 6.5 - 8.1 g/dL   Albumin 3.4 (L) 3.5 - 5.0 g/dL   AST 35 15 - 41 U/L   ALT 30 0 - 44 U/L   Alkaline Phosphatase 87 38 - 126 U/L   Total Bilirubin 4.9 (H) 0.0 - 1.2  mg/dL   GFR, Estimated 54 (L) >60 mL/min   Anion gap 14 5 - 15  Lipase, blood  Result Value Ref Range   Lipase 21 11 - 51 U/L  Urinalysis, Routine w reflex microscopic -Urine, Clean Catch  Result Value Ref Range   Color, Urine AMBER (A) YELLOW   APPearance HAZY (A) CLEAR   Specific Gravity, Urine 1.021 1.005 - 1.030   pH 5.0 5.0 - 8.0   Glucose, UA >=500 (A) NEGATIVE mg/dL   Hgb urine dipstick SMALL (A) NEGATIVE   Bilirubin Urine NEGATIVE NEGATIVE   Ketones, ur 5 (A) NEGATIVE mg/dL   Protein, ur 956 (A) NEGATIVE mg/dL   Nitrite NEGATIVE NEGATIVE   Leukocytes,Ua NEGATIVE NEGATIVE   RBC / HPF 0-5 0 - 5 RBC/hpf   WBC, UA 11-20 0 - 5 WBC/hpf   Bacteria, UA RARE (A) NONE SEEN   Squamous Epithelial / HPF 0-5 0 - 5 /HPF   Mucus PRESENT    Hyaline Casts, UA PRESENT   Comprehensive metabolic panel  Result Value Ref Range   Sodium 135 135 - 145 mmol/L   Potassium 3.5 3.5 - 5.1 mmol/L   Chloride 103 98 - 111 mmol/L   CO2 23 22 - 32 mmol/L   Glucose, Bld 169 (H) 70 - 99 mg/dL   BUN 19 8 - 23 mg/dL   Creatinine, Ser 2.13 0.61 - 1.24 mg/dL   Calcium  8.5 (L) 8.9 - 10.3 mg/dL   Total Protein 7.4 6.5 - 8.1 g/dL   Albumin 2.9 (L) 3.5 - 5.0 g/dL   AST 39 15 - 41 U/L   ALT 35 0 - 44 U/L   Alkaline Phosphatase 86 38 - 126 U/L   Total Bilirubin 3.3 (H) 0.0 - 1.2 mg/dL   GFR, Estimated >08 >65 mL/min   Anion gap 9 5 - 15  CBC  Result Value Ref Range   WBC 19.6 (H) 4.0 - 10.5 K/uL   RBC 4.45 4.22 - 5.81 MIL/uL   Hemoglobin 14.5 13.0 - 17.0 g/dL   HCT 78.4 69.6 - 29.5 %   MCV 96.2 80.0 - 100.0 fL   MCH 32.6 26.0 - 34.0 pg   MCHC 33.9 30.0 - 36.0 g/dL   RDW 28.4 13.2 - 44.0 %   Platelets 257 150 - 400 K/uL   nRBC 0.0 0.0 - 0.2 %  Bilirubin, direct  Result Value Ref Range   Bilirubin, Direct 1.1 (H) 0.0 - 0.2 mg/dL  I-Stat CG4 Lactic Acid  Result Value Ref Range   Lactic Acid, Venous 2.4 (HH) 0.5 - 1.9 mmol/L   Comment NOTIFIED PHYSICIAN   I-Stat CG4 Lactic Acid  Result Value Ref  Range   Lactic Acid, Venous 1.3 0.5 - 1.9 mmol/L  CBG monitoring, ED  Result Value Ref Range   Glucose-Capillary 196 (H) 70 - 99 mg/dL  CBG monitoring, ED  Result Value Ref Range   Glucose-Capillary 200 (H) 70 - 99 mg/dL  CBG monitoring, ED  Result Value Ref Range   Glucose-Capillary 156 (H) 70 - 99 mg/dL  Troponin I (High Sensitivity)  Result Value Ref Range   Troponin I (High Sensitivity) 19 (H) <18 ng/L  Troponin I (High Sensitivity)  Result Value Ref Range   Troponin I (High Sensitivity) 26 (H) <18 ng/L        Assessment & Plan:   SOB (shortness of breath) -     EKG 12-Lead -     Rhythm strip; Future  Tachycardia  Generalized abdominal pain  Other elevated white blood cell (WBC) count  Elevated bilirubin   Tachycardia, abdominal pain, increased work of breathing. WBC 18.9 on 04/25/24. Concern for Acute Abdomen/sepsis given previous labs and now increased HR and respirations. Discussed that he would be best served in the ER for further eval and treatment.  Declines EMS Family to take him directly to ER  No orders of the defined types were placed in this encounter.   Return if symptoms worsen or fail to improve.  Wellington Half, FNP

## 2024-04-28 ENCOUNTER — Ambulatory Visit

## 2024-04-28 ENCOUNTER — Observation Stay (HOSPITAL_COMMUNITY): Admitting: Certified Registered Nurse Anesthetist

## 2024-04-28 ENCOUNTER — Encounter (HOSPITAL_COMMUNITY): Payer: Self-pay | Admitting: Student

## 2024-04-28 ENCOUNTER — Encounter: Payer: Self-pay | Admitting: Family Medicine

## 2024-04-28 ENCOUNTER — Ambulatory Visit: Admitting: Gastroenterology

## 2024-04-28 ENCOUNTER — Other Ambulatory Visit: Payer: Self-pay

## 2024-04-28 ENCOUNTER — Encounter (HOSPITAL_COMMUNITY)
Admission: EM | Disposition: A | Discharge status: Home / Self Care | Payer: Self-pay | Source: Home / Self Care | Attending: Internal Medicine

## 2024-04-28 DIAGNOSIS — Z7984 Long term (current) use of oral hypoglycemic drugs: Secondary | ICD-10-CM

## 2024-04-28 DIAGNOSIS — K810 Acute cholecystitis: Secondary | ICD-10-CM

## 2024-04-28 DIAGNOSIS — N179 Acute kidney failure, unspecified: Secondary | ICD-10-CM | POA: Diagnosis present

## 2024-04-28 DIAGNOSIS — K219 Gastro-esophageal reflux disease without esophagitis: Secondary | ICD-10-CM | POA: Diagnosis present

## 2024-04-28 DIAGNOSIS — Z794 Long term (current) use of insulin: Secondary | ICD-10-CM | POA: Diagnosis not present

## 2024-04-28 DIAGNOSIS — K828 Other specified diseases of gallbladder: Principal | ICD-10-CM | POA: Insufficient documentation

## 2024-04-28 DIAGNOSIS — R1084 Generalized abdominal pain: Secondary | ICD-10-CM | POA: Insufficient documentation

## 2024-04-28 DIAGNOSIS — K658 Other peritonitis: Secondary | ICD-10-CM | POA: Diagnosis present

## 2024-04-28 DIAGNOSIS — K650 Generalized (acute) peritonitis: Secondary | ICD-10-CM | POA: Diagnosis not present

## 2024-04-28 DIAGNOSIS — J45909 Unspecified asthma, uncomplicated: Secondary | ICD-10-CM | POA: Diagnosis present

## 2024-04-28 DIAGNOSIS — I10 Essential (primary) hypertension: Secondary | ICD-10-CM | POA: Diagnosis not present

## 2024-04-28 DIAGNOSIS — E119 Type 2 diabetes mellitus without complications: Secondary | ICD-10-CM | POA: Diagnosis not present

## 2024-04-28 DIAGNOSIS — E7800 Pure hypercholesterolemia, unspecified: Secondary | ICD-10-CM | POA: Diagnosis present

## 2024-04-28 DIAGNOSIS — Z9081 Acquired absence of spleen: Secondary | ICD-10-CM | POA: Diagnosis not present

## 2024-04-28 DIAGNOSIS — Z87442 Personal history of urinary calculi: Secondary | ICD-10-CM | POA: Diagnosis not present

## 2024-04-28 DIAGNOSIS — A419 Sepsis, unspecified organism: Secondary | ICD-10-CM | POA: Diagnosis not present

## 2024-04-28 DIAGNOSIS — Z888 Allergy status to other drugs, medicaments and biological substances status: Secondary | ICD-10-CM | POA: Diagnosis not present

## 2024-04-28 DIAGNOSIS — Z9049 Acquired absence of other specified parts of digestive tract: Secondary | ICD-10-CM

## 2024-04-28 DIAGNOSIS — R0602 Shortness of breath: Secondary | ICD-10-CM | POA: Insufficient documentation

## 2024-04-28 DIAGNOSIS — R140 Abdominal distension (gaseous): Secondary | ICD-10-CM | POA: Diagnosis not present

## 2024-04-28 DIAGNOSIS — K82A1 Gangrene of gallbladder in cholecystitis: Secondary | ICD-10-CM | POA: Diagnosis not present

## 2024-04-28 DIAGNOSIS — I878 Other specified disorders of veins: Secondary | ICD-10-CM | POA: Diagnosis not present

## 2024-04-28 DIAGNOSIS — R17 Unspecified jaundice: Secondary | ICD-10-CM | POA: Diagnosis not present

## 2024-04-28 DIAGNOSIS — J9811 Atelectasis: Secondary | ICD-10-CM | POA: Diagnosis not present

## 2024-04-28 DIAGNOSIS — E1165 Type 2 diabetes mellitus with hyperglycemia: Secondary | ICD-10-CM | POA: Diagnosis present

## 2024-04-28 DIAGNOSIS — K8012 Calculus of gallbladder with acute and chronic cholecystitis without obstruction: Secondary | ICD-10-CM | POA: Diagnosis not present

## 2024-04-28 DIAGNOSIS — R000 Tachycardia, unspecified: Secondary | ICD-10-CM | POA: Insufficient documentation

## 2024-04-28 DIAGNOSIS — Z79899 Other long term (current) drug therapy: Secondary | ICD-10-CM | POA: Diagnosis not present

## 2024-04-28 DIAGNOSIS — E860 Dehydration: Secondary | ICD-10-CM | POA: Diagnosis present

## 2024-04-28 DIAGNOSIS — D72829 Elevated white blood cell count, unspecified: Secondary | ICD-10-CM | POA: Insufficient documentation

## 2024-04-28 DIAGNOSIS — K449 Diaphragmatic hernia without obstruction or gangrene: Secondary | ICD-10-CM | POA: Diagnosis present

## 2024-04-28 DIAGNOSIS — R011 Cardiac murmur, unspecified: Secondary | ICD-10-CM | POA: Diagnosis present

## 2024-04-28 DIAGNOSIS — K760 Fatty (change of) liver, not elsewhere classified: Secondary | ICD-10-CM | POA: Diagnosis present

## 2024-04-28 DIAGNOSIS — Z83719 Family history of colon polyps, unspecified: Secondary | ICD-10-CM | POA: Diagnosis not present

## 2024-04-28 DIAGNOSIS — Z8249 Family history of ischemic heart disease and other diseases of the circulatory system: Secondary | ICD-10-CM | POA: Diagnosis not present

## 2024-04-28 DIAGNOSIS — E872 Acidosis, unspecified: Secondary | ICD-10-CM | POA: Diagnosis present

## 2024-04-28 HISTORY — PX: CHOLECYSTECTOMY: SHX55

## 2024-04-28 LAB — CBC
HCT: 42.8 % (ref 39.0–52.0)
Hemoglobin: 14.5 g/dL (ref 13.0–17.0)
MCH: 32.6 pg (ref 26.0–34.0)
MCHC: 33.9 g/dL (ref 30.0–36.0)
MCV: 96.2 fL (ref 80.0–100.0)
Platelets: 257 10*3/uL (ref 150–400)
RBC: 4.45 MIL/uL (ref 4.22–5.81)
RDW: 14.1 % (ref 11.5–15.5)
WBC: 19.6 10*3/uL — ABNORMAL HIGH (ref 4.0–10.5)
nRBC: 0 % (ref 0.0–0.2)

## 2024-04-28 LAB — BILIRUBIN, DIRECT: Bilirubin, Direct: 1.1 mg/dL — ABNORMAL HIGH (ref 0.0–0.2)

## 2024-04-28 LAB — GLUCOSE, CAPILLARY
Glucose-Capillary: 164 mg/dL — ABNORMAL HIGH (ref 70–99)
Glucose-Capillary: 209 mg/dL — ABNORMAL HIGH (ref 70–99)
Glucose-Capillary: 212 mg/dL — ABNORMAL HIGH (ref 70–99)
Glucose-Capillary: 281 mg/dL — ABNORMAL HIGH (ref 70–99)

## 2024-04-28 LAB — COMPREHENSIVE METABOLIC PANEL WITH GFR
ALT: 35 U/L (ref 0–44)
AST: 39 U/L (ref 15–41)
Albumin: 2.9 g/dL — ABNORMAL LOW (ref 3.5–5.0)
Alkaline Phosphatase: 86 U/L (ref 38–126)
Anion gap: 9 (ref 5–15)
BUN: 19 mg/dL (ref 8–23)
CO2: 23 mmol/L (ref 22–32)
Calcium: 8.5 mg/dL — ABNORMAL LOW (ref 8.9–10.3)
Chloride: 103 mmol/L (ref 98–111)
Creatinine, Ser: 1.15 mg/dL (ref 0.61–1.24)
GFR, Estimated: >60 mL/min (ref >60)
Glucose, Bld: 169 mg/dL — ABNORMAL HIGH (ref 70–99)
Potassium: 3.5 mmol/L (ref 3.5–5.1)
Sodium: 135 mmol/L (ref 135–145)
Total Bilirubin: 3.3 mg/dL — ABNORMAL HIGH (ref 0.0–1.2)
Total Protein: 7.4 g/dL (ref 6.5–8.1)

## 2024-04-28 LAB — CBG MONITORING, ED
Glucose-Capillary: 196 mg/dL — ABNORMAL HIGH (ref 70–99)
Glucose-Capillary: 200 mg/dL — ABNORMAL HIGH (ref 70–99)
Glucose-Capillary: 156 mg/dL — ABNORMAL HIGH (ref 70–99)

## 2024-04-28 LAB — I-STAT CG4 LACTIC ACID, ED: Lactic Acid, Venous: 1.3 mmol/L (ref 0.5–1.9)

## 2024-04-28 SURGERY — LAPAROSCOPIC CHOLECYSTECTOMY WITH INTRAOPERATIVE CHOLANGIOGRAM
Anesthesia: General

## 2024-04-28 MED ORDER — SENNOSIDES-DOCUSATE SODIUM 8.6-50 MG PO TABS: 1.0000 | ORAL_TABLET | Freq: Every evening | ORAL | Status: DC | PRN

## 2024-04-28 MED ORDER — CHLORHEXIDINE GLUCONATE 0.12 % MT SOLN: 15.0000 mL | Freq: Once | OROMUCOSAL | Status: AC

## 2024-04-28 MED ORDER — DEXAMETHASONE SODIUM PHOSPHATE 10 MG/ML IJ SOLN
INTRAMUSCULAR | Status: AC
  Filled 2024-04-28: qty 1

## 2024-04-28 MED ORDER — ONDANSETRON HCL 4 MG/2ML IJ SOLN
INTRAMUSCULAR | Status: DC | PRN
  Administered 2024-04-28: 4 mg via INTRAVENOUS

## 2024-04-28 MED ORDER — INSULIN ASPART 100 UNIT/ML IJ SOLN: 0.0000 [IU] | INTRAMUSCULAR | Status: DC | PRN

## 2024-04-28 MED ORDER — FENTANYL CITRATE (PF) 250 MCG/5ML IJ SOLN
INTRAMUSCULAR | Status: AC
  Filled 2024-04-28: qty 5

## 2024-04-28 MED ORDER — POLYETHYLENE GLYCOL 3350 17 G PO PACK
17.0000 g | PACK | Freq: Every day | ORAL | Status: DC
  Administered 2024-04-29: 17 g via ORAL
  Filled 2024-04-28 (×2): qty 1

## 2024-04-28 MED ORDER — DEXAMETHASONE SODIUM PHOSPHATE 10 MG/ML IJ SOLN
INTRAMUSCULAR | Status: DC | PRN
  Administered 2024-04-28: 10 mg via INTRAVENOUS

## 2024-04-28 MED ORDER — MIDAZOLAM HCL 2 MG/2ML IJ SOLN
INTRAMUSCULAR | Status: AC
Start: 2024-04-28 — End: ?
  Filled 2024-04-28: qty 2

## 2024-04-28 MED ORDER — ORAL CARE MOUTH RINSE: 15.0000 mL | Freq: Once | OROMUCOSAL | Status: AC

## 2024-04-28 MED ORDER — DEXTROSE-SODIUM CHLORIDE 5-0.9 % IV SOLN
INTRAVENOUS | Status: AC
Start: 2024-04-28 — End: 2024-04-29

## 2024-04-28 MED ORDER — CHLORHEXIDINE GLUCONATE 0.12 % MT SOLN
OROMUCOSAL | Status: AC
  Administered 2024-04-28: 15 mL via OROMUCOSAL
  Filled 2024-04-28: qty 15

## 2024-04-28 MED ORDER — ACETAMINOPHEN 325 MG PO TABS
650.0000 mg | ORAL_TABLET | Freq: Four times a day (QID) | ORAL | Status: DC | PRN
  Administered 2024-05-01: 650 mg via ORAL
  Filled 2024-04-28: qty 2

## 2024-04-28 MED ORDER — ONDANSETRON HCL 4 MG PO TABS: 4.0000 mg | ORAL_TABLET | Freq: Four times a day (QID) | ORAL | Status: DC | PRN

## 2024-04-28 MED ORDER — ONDANSETRON HCL 4 MG/2ML IJ SOLN
INTRAMUSCULAR | Status: AC
  Filled 2024-04-28: qty 2

## 2024-04-28 MED ORDER — HYDROCODONE-ACETAMINOPHEN 5-325 MG PO TABS
1.0000 | ORAL_TABLET | ORAL | Status: DC | PRN
  Administered 2024-04-28: 2 via ORAL
  Filled 2024-04-28 (×2): qty 2

## 2024-04-28 MED ORDER — 0.9 % SODIUM CHLORIDE (POUR BTL) OPTIME
TOPICAL | Status: DC | PRN
  Administered 2024-04-28: 1000 mL

## 2024-04-28 MED ORDER — ENOXAPARIN SODIUM 40 MG/0.4ML IJ SOSY
40.0000 mg | PREFILLED_SYRINGE | INTRAMUSCULAR | Status: DC
  Administered 2024-04-29 – 2024-05-02 (×4): 40 mg via SUBCUTANEOUS
  Filled 2024-04-28 (×4): qty 0.4

## 2024-04-28 MED ORDER — AMLODIPINE BESYLATE 10 MG PO TABS
10.0000 mg | ORAL_TABLET | Freq: Every day | ORAL | Status: DC
  Administered 2024-04-28 – 2024-05-02 (×5): 10 mg via ORAL
  Filled 2024-04-28 (×5): qty 1

## 2024-04-28 MED ORDER — BUPIVACAINE HCL (PF) 0.25 % IJ SOLN
INTRAMUSCULAR | Status: AC
  Filled 2024-04-28: qty 30

## 2024-04-28 MED ORDER — HYDROMORPHONE HCL 1 MG/ML IJ SOLN
0.5000 mg | Freq: Four times a day (QID) | INTRAMUSCULAR | Status: DC | PRN
  Administered 2024-04-30: 0.5 mg via INTRAVENOUS
  Filled 2024-04-28: qty 0.5

## 2024-04-28 MED ORDER — FENTANYL CITRATE (PF) 250 MCG/5ML IJ SOLN
INTRAMUSCULAR | Status: DC | PRN
  Administered 2024-04-28: 50 ug via INTRAVENOUS
  Administered 2024-04-28: 100 ug via INTRAVENOUS

## 2024-04-28 MED ORDER — LACTATED RINGERS IV SOLN: INTRAVENOUS | Status: DC

## 2024-04-28 MED ORDER — BUPIVACAINE-EPINEPHRINE 0.25% -1:200000 IJ SOLN
INTRAMUSCULAR | Status: DC | PRN
  Administered 2024-04-28: 7 mL

## 2024-04-28 MED ORDER — LIDOCAINE 2% (20 MG/ML) 5 ML SYRINGE
INTRAMUSCULAR | Status: DC | PRN
  Administered 2024-04-28: 100 mg via INTRAVENOUS

## 2024-04-28 MED ORDER — SODIUM CHLORIDE 0.9 % IR SOLN
Status: DC | PRN
  Administered 2024-04-28: 1000 mL

## 2024-04-28 MED ORDER — ACETAMINOPHEN 650 MG RE SUPP: 650.0000 mg | Freq: Four times a day (QID) | RECTAL | Status: DC | PRN

## 2024-04-28 MED ORDER — LIDOCAINE 2% (20 MG/ML) 5 ML SYRINGE
INTRAMUSCULAR | Status: AC
  Filled 2024-04-28: qty 5

## 2024-04-28 MED ORDER — INSULIN ASPART 100 UNIT/ML IJ SOLN
0.0000 [IU] | INTRAMUSCULAR | Status: DC
  Administered 2024-04-28: 8 [IU] via SUBCUTANEOUS
  Administered 2024-04-28: 5 [IU] via SUBCUTANEOUS
  Administered 2024-04-28: 2 [IU] via SUBCUTANEOUS
  Administered 2024-04-28 – 2024-04-29 (×2): 3 [IU] via SUBCUTANEOUS
  Administered 2024-04-29: 5 [IU] via SUBCUTANEOUS
  Administered 2024-04-29: 3 [IU] via SUBCUTANEOUS
  Administered 2024-04-29: 2 [IU] via SUBCUTANEOUS
  Administered 2024-04-29 (×2): 3 [IU] via SUBCUTANEOUS
  Administered 2024-04-30: 2 [IU] via SUBCUTANEOUS
  Administered 2024-04-30 (×2): 3 [IU] via SUBCUTANEOUS
  Administered 2024-04-30: 2 [IU] via SUBCUTANEOUS
  Administered 2024-05-01 (×2): 3 [IU] via SUBCUTANEOUS
  Administered 2024-05-02: 5 [IU] via SUBCUTANEOUS

## 2024-04-28 MED ORDER — INSULIN GLARGINE-YFGN 100 UNIT/ML ~~LOC~~ SOLN
20.0000 [IU] | Freq: Every day | SUBCUTANEOUS | Status: DC
  Administered 2024-04-28 – 2024-05-01 (×4): 20 [IU] via SUBCUTANEOUS
  Filled 2024-04-28 (×5): qty 0.2

## 2024-04-28 MED ORDER — SENNOSIDES-DOCUSATE SODIUM 8.6-50 MG PO TABS
1.0000 | ORAL_TABLET | Freq: Every day | ORAL | Status: DC
  Administered 2024-04-28 – 2024-04-30 (×2): 1 via ORAL
  Filled 2024-04-28 (×4): qty 1

## 2024-04-28 MED ORDER — METOPROLOL TARTRATE 5 MG/5ML IV SOLN
INTRAVENOUS | Status: DC | PRN
  Administered 2024-04-28: 2 mg via INTRAVENOUS

## 2024-04-28 MED ORDER — SUGAMMADEX SODIUM 200 MG/2ML IV SOLN
INTRAVENOUS | Status: DC | PRN
  Administered 2024-04-28: 200 mg via INTRAVENOUS

## 2024-04-28 MED ORDER — SUCCINYLCHOLINE CHLORIDE 200 MG/10ML IV SOSY
PREFILLED_SYRINGE | INTRAVENOUS | Status: DC | PRN
  Administered 2024-04-28: 80 mg via INTRAVENOUS

## 2024-04-28 MED ORDER — PROPOFOL 10 MG/ML IV BOLUS
INTRAVENOUS | Status: DC | PRN
  Administered 2024-04-28: 160 mg via INTRAVENOUS

## 2024-04-28 MED ORDER — ROCURONIUM BROMIDE 10 MG/ML (PF) SYRINGE
PREFILLED_SYRINGE | INTRAVENOUS | Status: DC | PRN
  Administered 2024-04-28: 40 mg via INTRAVENOUS

## 2024-04-28 MED ORDER — PIPERACILLIN-TAZOBACTAM 3.375 G IVPB
3.3750 g | Freq: Three times a day (TID) | INTRAVENOUS | Status: DC
  Administered 2024-04-28 – 2024-05-02 (×11): 3.375 g via INTRAVENOUS
  Filled 2024-04-28 (×11): qty 50

## 2024-04-28 MED ORDER — ROCURONIUM BROMIDE 10 MG/ML (PF) SYRINGE
PREFILLED_SYRINGE | INTRAVENOUS | Status: AC
Start: 2024-04-28 — End: ?
  Filled 2024-04-28: qty 10

## 2024-04-28 MED ORDER — SODIUM CHLORIDE 0.9 % IV SOLN: 2.0000 g | INTRAVENOUS | Status: DC

## 2024-04-28 MED ORDER — ONDANSETRON HCL 4 MG/2ML IJ SOLN
4.0000 mg | Freq: Four times a day (QID) | INTRAMUSCULAR | Status: DC | PRN
  Administered 2024-04-28 – 2024-04-29 (×2): 4 mg via INTRAVENOUS
  Filled 2024-04-28 (×2): qty 2

## 2024-04-28 MED ORDER — MIDAZOLAM HCL 2 MG/2ML IJ SOLN
INTRAMUSCULAR | Status: DC | PRN
  Administered 2024-04-28: 2 mg via INTRAVENOUS

## 2024-04-28 MED ORDER — SODIUM CHLORIDE 0.9 % IV SOLN: INTRAVENOUS | Status: AC

## 2024-04-28 MED ORDER — SUCCINYLCHOLINE CHLORIDE 200 MG/10ML IV SOSY
PREFILLED_SYRINGE | INTRAVENOUS | Status: AC
  Filled 2024-04-28: qty 10

## 2024-04-28 MED ORDER — PROPOFOL 10 MG/ML IV BOLUS
INTRAVENOUS | Status: AC
  Filled 2024-04-28: qty 20

## 2024-04-28 SURGICAL SUPPLY — 41 items
BAG COUNTER SPONGE SURGICOUNT (BAG) ×1 IMPLANT
CANISTER SUCTION 3000ML PPV (SUCTIONS) ×1 IMPLANT
CHLORAPREP W/TINT 26 (MISCELLANEOUS) ×1 IMPLANT
CLIP APPLIE 5 13 M/L LIGAMAX5 (MISCELLANEOUS) IMPLANT
CLIP LIGATING HEMO O LOK GREEN (MISCELLANEOUS) ×1 IMPLANT
COVER MAYO STAND STRL (DRAPES) ×1 IMPLANT
COVER SURGICAL LIGHT HANDLE (MISCELLANEOUS) ×1 IMPLANT
COVER TRANSDUCER ULTRASND (DRAPES) ×1 IMPLANT
DERMABOND ADVANCED .7 DNX12 (GAUZE/BANDAGES/DRESSINGS) ×1 IMPLANT
DRAPE C-ARM 42X120 X-RAY (DRAPES) ×1 IMPLANT
ELECTRODE REM PT RTRN 9FT ADLT (ELECTROSURGICAL) ×1 IMPLANT
GLOVE BIO SURGEON STRL SZ7.5 (GLOVE) ×2 IMPLANT
GOWN STRL REUS W/ TWL LRG LVL3 (GOWN DISPOSABLE) ×2 IMPLANT
GOWN STRL REUS W/ TWL XL LVL3 (GOWN DISPOSABLE) ×1 IMPLANT
GRASPER SUT TROCAR 14GX15 (MISCELLANEOUS) ×1 IMPLANT
HEMOSTAT SURGICEL 2X14 (HEMOSTASIS) IMPLANT
IRRIGATION SUCT STRKRFLW 2 WTP (MISCELLANEOUS) ×1 IMPLANT
IV CATH 14GX2 1/4 (CATHETERS) ×1 IMPLANT
KIT BASIN OR (CUSTOM PROCEDURE TRAY) ×1 IMPLANT
KIT IMAGING PINPOINTPAQ (MISCELLANEOUS) IMPLANT
KIT TURNOVER KIT B (KITS) ×1 IMPLANT
NDL INSUFFLATION 14GA 120MM (NEEDLE) ×1 IMPLANT
NEEDLE INSUFFLATION 14GA 120MM (NEEDLE) ×1 IMPLANT
NS IRRIG 1000ML POUR BTL (IV SOLUTION) ×1 IMPLANT
PAD ARMBOARD POSITIONER FOAM (MISCELLANEOUS) ×2 IMPLANT
POUCH LAPAROSCOPIC INSTRUMENT (MISCELLANEOUS) ×1 IMPLANT
POUCH RETRIEVAL ECOSAC 10 (ENDOMECHANICALS) IMPLANT
SCISSORS LAP 5X35 DISP (ENDOMECHANICALS) ×1 IMPLANT
SET CHOLANGIOGRAPHY FRANKLIN (SET/KITS/TRAYS/PACK) ×1 IMPLANT
SET TUBE SMOKE EVAC HIGH FLOW (TUBING) ×1 IMPLANT
SLEEVE Z-THREAD 5X100MM (TROCAR) ×1 IMPLANT
SPECIMEN JAR SMALL (MISCELLANEOUS) ×1 IMPLANT
SUT MNCRL AB 4-0 PS2 18 (SUTURE) ×1 IMPLANT
SUT VICRYL 0 UR6 27IN ABS (SUTURE) IMPLANT
TOWEL GREEN STERILE (TOWEL DISPOSABLE) ×1 IMPLANT
TOWEL GREEN STERILE FF (TOWEL DISPOSABLE) ×1 IMPLANT
TRAY LAPAROSCOPIC MC (CUSTOM PROCEDURE TRAY) ×1 IMPLANT
TROCAR 11X100 Z THREAD (TROCAR) ×1 IMPLANT
TROCAR Z-THREAD OPTICAL 5X100M (TROCAR) ×1 IMPLANT
WARMER LAPAROSCOPE (MISCELLANEOUS) ×1 IMPLANT
WATER STERILE IRR 1000ML POUR (IV SOLUTION) ×1 IMPLANT

## 2024-04-28 NOTE — Op Note (Signed)
 04/28/2024  3:18 PM  PATIENT:  Benjamin Patton  73 y.o. male  PRE-OPERATIVE DIAGNOSIS:  cholecystitis  POST-OPERATIVE DIAGNOSIS:  acute gangrenous cholecystitis  PROCEDURE:  Procedure(s): LAPAROSCOPIC CHOLECYSTECTOMY  SURGEON:  Surgeons and Role:    Shela Derby, MD - Primary  ASSISTANTS: none   ANESTHESIA:   local and general  EBL:  minimal   BLOOD ADMINISTERED:none  DRAINS: none   LOCAL MEDICATIONS USED:  BUPIVICAINE   SPECIMEN:  Source of Specimen:  gallbladder  DISPOSITION OF SPECIMEN:  PATHOLOGY  COUNTS:  YES  TOURNIQUET:  * No tourniquets in log *  DICTATION: .Dragon Dictation  The patient was taken to the operating and placed in the supine position with bilateral SCDs in place.  The patient was prepped and draped in the usual sterile fashion. A time out was called and all facts were verified. A pneumoperitoneum was obtained via A Veress needle technique to a pressure of 14mm of mercury.  A 5mm trochar was then placed in the right upper quadrant under visualization, and there were no injuries to any abdominal organs. A 11 mm port was then placed in the umbilical region after infiltrating with local anesthesia under direct visualization. A second and third epigastric port and right lower quadrant port placement under direct visualization, respectively.    The gallbladder was identified and retracted, the peritoneum was then sharply dissected from the gallbladder and this dissection was carried down to Calot's triangle. The cystic duct was identified and found to be necrotic and gangrenous.  It was grasped and stripped away circumferentially and seen going into the gallbladder 360, the critical angle was obtained.  2 clips were placed proximally one distally and the cystic duct transected. The cystic artery was identified and 2 clips placed proximally and one distally and transected.  We then proceeded to remove the gallbladder off the hepatic fossa with Bovie  cautery.  There was some spillage of stones that were all irrigated out.  A retrieval bag was then placed in the abdomen and gallbladder placed in the bag. The hepatic fossa was then reexamined and hemostasis was achieved with Bovie cautery and was excellent at the end of the case.   The subhepatic fossa and perihepatic fossa was then irrigated until the effluent was clear.  The gallbladder and bag were removed from the abdominal cavity. The 11 mm trocar fascia was reapproximated with the EndoClose #1 Vicryl x1.  The pneumoperitoneum was evacuated and all trochars removed under direct visulalization.  The skin was then closed with 4-0 Monocryl and the skin dressed with Dermabond.    The patient was awaken from general anesthesia and taken to the recovery room in stable condition.   PLAN OF CARE: Admit for overnight observation  PATIENT DISPOSITION:  PACU - hemodynamically stable.   Delay start of Pharmacological VTE agent (>24hrs) due to surgical blood loss or risk of bleeding: not applicable
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
## 2024-04-28 NOTE — Progress Notes (Signed)
 Progress Note   Patient: Benjamin Patton NWG:[id number] DOB: [birth year] DOA: 04/27/2024     0 DOS: the patient was seen and examined on 04/28/2024   Brief hospital course: 73yo with h/o HTN, HLD, asthma, and DM who presented on 6/2 with RUQ pain.  RUQ US  with gallbladder distention, CT with asymmetric wall thickening in the antral region of the stomach.  Surgery consulted.  Given Ceftriaxone.  Assessment and Plan:  Acute cholecystitis Presented with 4 to 5 days of progressive RUQ pain with associated SOB RUQ U/S showed biliary sludge and gallbladder wall thickening Patient with RUQ tenderness and positive Murphy sign on exam Meets sepsis criteria with tachycardic, tachypnea, lactic acidosis, leukocytosis and evidence of acute cholecystitis General Surgery consulted, cholecystectomy planned for today Continue Ceftriaxone Pain control with PRN IV Dilaudid Follow-up blood cultures   AKI Increased creatinine to 1.4 on admission, now back to 1.15 Likely secondary to mild dehydration in the setting of acute infection and decreased p.o. intake Avoid nephrotoxic's agents   Elevated bilirubin Significant rise in bilirubin from 1.7 on 5/31 to 4.9 on admission, improved to 3.3 this AM No intrahepatic ductal dilatation on abdominal imaging Likely secondary to acute cholecystitis but cannot rule out extrahepatic cholestasis Will have intraoperative cholangiogram   Type 2 diabetes A1c 14.9%, very poor control Q4H SSI with CBG monitoring while NPO Hold glipizide , pioglitazone  while inpatient Continue glargine   Constipation Reports no bowel movement over the last 4 to 5 days Also states he is not have much food due to decreased appetite Start scheduled MiraLAX and Senokot-S   HTN BP elevated with SBP in the 130s to 150s Likely worsened with abdominal pain Resume amlodipine  Hold benazepril  in the setting of AKI   HLD Lipid panel 2 months ago showed total cholesterol of 218 and LDL of  156 Hold rosuvastatin  in the setting of abnormal liver function   Asymmetric stomach wall thickening CT on 5/31 showed an "asymmetric wall thickening in the antral region in the stomach. Based on sagittal imaging, this may be within normal limits, but mucosal lesion is not excluded. GI follow-up recommended and upper endoscopy may be warranted." Patient has GI follow-up scheduled for 05/13/2024 with Dr. Elvin Hammer     Consultants: Surgery  Procedures: Lap chole with intraoperative cholangiogram 6/3  Antibiotics: Ceftriaxone 6/2-      Subjective: Seen in the ER on 5/31 with abdominal pain, discharged but the pain was too much to bear.  RUQ pain. Planning for lap chole soon.  Physical Exam: Vitals:   04/28/24 0545 04/28/24 0600 04/28/24 0615 04/28/24 0616  BP:    (!) 148/91  Pulse: (!) 113 (!) 108 100 (!) 104  Resp: (!) 26 (!) 31 (!) 25 (!) 24  Temp:      TempSrc:      SpO2: 96% 95% 94% 95%  Weight:      Height:         Intake/Output Summary (Last 24 hours) at 04/28/2024 0744 Last data filed at 04/28/2024 0007 Gross per 24 hour  Intake 1100 ml  Output --  Net 1100 ml   Filed Weights   04/27/24 1555  Weight: 82.1 kg    Exam:  General:  Appears calm and comfortable and is in NAD Eyes:   normal lids, iris ENT:  grossly normal hearing, lips & tongue, mmm Cardiovascular:  RRR, +diastolic murmur. No LE edema.  Respiratory:   CTA bilaterally with no wheezes/rales/rhonchi.  Normal respiratory effort. Abdomen:   distended,  diffusely TTP worse in BUQ Skin:  no rash or induration seen on limited exam Musculoskeletal:  grossly normal tone BUE/BLE, good ROM, no bony abnormality Psychiatric:  blunted mood and affect, speech fluent and appropriate, AOx3 Neurologic:  CN 2-12 grossly intact, moves all extremities in coordinated fashion  Data Reviewed: I have reviewed the patient's lab results since admission.  Pertinent labs for today include:   Glucose 169 WBC 19.6, down from  21.5 Lactate 2.4, 1.3    Family Communication: None present  Disposition: Status is: Observation The patient remains OBS appropriate and will d/c before 2 midnights.  Planned Discharge Destination: Home    Time spent: 50 minutes  Author: Lorita Rosa, MD 04/28/2024 7:42 AM  For on call review [email or web address].

## 2024-04-28 NOTE — Inpatient Diabetes Management (Signed)
 Inpatient Diabetes Program Recommendations  AACE/ADA: New Consensus Statement on Inpatient Glycemic Control (2015)  Target Ranges:  Prepandial:   less than 140 mg/dL      Peak postprandial:   less than 180 mg/dL (1-2 hours)      Critically ill patients:  140 - 180 mg/dL   Lab Results  Component Value Date   GLUCAP 156 (H) 04/28/2024   HGBA1C 14.9 (H) 02/26/2024    Review of Glycemic Control  Diabetes history: DM 2 Outpatient Diabetes medications: Actos  45 mg Daily, Glipizide  10 mg Daily, Lantus  20 units Daily Current orders for Inpatient glycemic control:  Novolog 0-15 units Q4 hours  A1c 14.9% on 4/2 at PCP visit  Was referred to PharmD for education and medication management at that time. Pt has had 2 visits with PharmD with much better control of glucose trends at home. Glucose lower on presentation to the ED this admission Pt has excellent follow up outpt. Has a follow up appointment in July.  Will follow trends while inpatient.  Thanks,  Eloise Hake RN, MSN, BC-ADM Inpatient Diabetes Coordinator Team Pager [phone] (8a-5p)
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
## 2024-04-28 NOTE — Anesthesia Postprocedure Evaluation (Signed)
 Anesthesia Post Note  Patient: Benjamin Patton  Procedure(s) Performed: LAPAROSCOPIC CHOLECYSTECTOMY WITH INTRAOPERATIVE CHOLANGIOGRAM     Patient location during evaluation: PACU Anesthesia Type: General Level of consciousness: awake and alert Pain management: pain level controlled Vital Signs Assessment: post-procedure vital signs reviewed and stable Respiratory status: spontaneous breathing, nonlabored ventilation, respiratory function stable and patient connected to nasal cannula oxygen Cardiovascular status: blood pressure returned to baseline and stable Postop Assessment: no apparent nausea or vomiting Anesthetic complications: no  No notable events documented.  Last Vitals:  Vitals:   04/28/24 1545 04/28/24 1600  BP: 138/89 (!) 139/92  Pulse: 87 86  Resp: (!) 25 (!) 33  Temp:  36.8 C  SpO2: 92% 92%    Last Pain:  Vitals:   04/28/24 1530  TempSrc:   PainSc: 0-No pain                 Rosalita Combe

## 2024-04-28 NOTE — Transfer of Care (Signed)
 Immediate Anesthesia Transfer of Care Note  Patient: Benjamin Patton  Procedure(s) Performed: LAPAROSCOPIC CHOLECYSTECTOMY WITH INTRAOPERATIVE CHOLANGIOGRAM  Patient Location: PACU  Anesthesia Type:General  Level of Consciousness: awake, alert , and oriented  Airway & Oxygen Therapy: Patient Spontanous Breathing  Post-op Assessment: Report given to RN and Post -op Vital signs reviewed and stable  Post vital signs: Reviewed and stable  Last Vitals:  Vitals Value Taken Time  BP 145/93 04/28/24 1531  Temp    Pulse 89 04/28/24 1533  Resp 22 04/28/24 1533  SpO2 95 % 04/28/24 1533  Vitals shown include unfiled device data.  Last Pain:  Vitals:   04/28/24 1333  TempSrc:   PainSc: 7       Patients Stated Pain Goal: 1 (04/28/24 1333)  Complications: No notable events documented.

## 2024-04-28 NOTE — Telephone Encounter (Signed)
 Inbound call from daughter Carron Clap, states patient got admitted to the hospital, and will not be able to make appointment for today at 3:00 pm. She states she would like provider to review notes from the hospital and give his recommendation on next steps. Call back number is [phone]

## 2024-04-28 NOTE — Telephone Encounter (Signed)
 Linda, 1.  Cancel procedure on June 18.  Linda, please block that slot 2.  Complete gallbladder evaluation (if planned with surgeon). 3.  After gallbladder issues sorted out, 1 way or the other, routine office visit with one of the advanced practitioners. 4.  Colonoscopy can be scheduled thereafter. We appreciate the daughter providing the update. Thanks, Dr. Elvin Hammer

## 2024-04-28 NOTE — ED Notes (Signed)
Wife updated at the bedside.

## 2024-04-28 NOTE — Telephone Encounter (Signed)
 Appt cancelled, slot blocked, pts daughter knows to call back once her father has recovered to set up an OV and get colon scheduled at that visit.

## 2024-04-28 NOTE — Telephone Encounter (Signed)
 Pt was having bad abd pain, daughter took him to the er. Daughter states he is most likely having his gallbladder removed. Pt is scheduled for a colon 05/13/24. She wants to know if her father will need an OV prior to having colon done and if colon needs to be pushed out due to surgery. Please advise.

## 2024-04-28 NOTE — Anesthesia Procedure Notes (Signed)
 Procedure Name: Intubation Date/Time: 04/28/2024 2:25 PM  Performed by: Artemisa Bile D, CRNAPre-anesthesia Checklist: Patient identified, Emergency Drugs available, Suction available and Patient being monitored Patient Re-evaluated:Patient Re-evaluated prior to induction Oxygen Delivery Method: Circle System Utilized Preoxygenation: Pre-oxygenation with 100% oxygen Induction Type: IV induction and Rapid sequence Laryngoscope Size: Mac and 4 Grade View: Grade I Tube type: Oral Tube size: 7.5 mm Number of attempts: 1 Airway Equipment and Method: Stylet and Oral airway Placement Confirmation: ETT inserted through vocal cords under direct vision, positive ETCO2 and breath sounds checked- equal and bilateral Secured at: 24 cm Tube secured with: Tape Dental Injury: Teeth and Oropharynx as per pre-operative assessment

## 2024-04-28 NOTE — Anesthesia Preprocedure Evaluation (Addendum)
 Anesthesia Evaluation  Patient identified by MRN, date of birth, ID band Patient awake    Reviewed: Allergy & Precautions, NPO status , Patient's Chart, lab work & pertinent test results  Airway Mallampati: III  TM Distance: >3 FB Neck ROM: Full    Dental  (+) Dental Advisory Given, Partial Lower, Partial Upper   Pulmonary neg pulmonary ROS   Pulmonary exam normal breath sounds clear to auscultation       Cardiovascular hypertension (142/82 preop), Pt. on medications Normal cardiovascular exam Rhythm:Regular Rate:Normal     Neuro/Psych negative neurological ROS  negative psych ROS   GI/Hepatic ,GERD  Medicated and Controlled,,Elevated bilirubin Nausea and vomiting Acute cholecystitis with possible choledocholithiasis   Endo/Other  diabetes, Poorly Controlled, Type 2, Oral Hypoglycemic Agents, Insulin Dependent  A1c 15 FS 167  Renal/GU Renal diseaseHx/o renal calculi  negative genitourinary   Musculoskeletal  (+) Arthritis , Osteoarthritis,    Abdominal  (+)  Abdomen: tender.   Peds  Hematology negative hematology ROS (+) Hb 14.5   Anesthesia Other Findings   Reproductive/Obstetrics negative OB ROS                             Anesthesia Physical Anesthesia Plan  ASA: 3  Anesthesia Plan: General   Post-op Pain Management: Dilaudid IV and Tylenol  PO (pre-op)*   Induction: Intravenous, Cricoid pressure planned and Rapid sequence  PONV Risk Score and Plan: 4 or greater and Treatment may vary due to age or medical condition and Ondansetron  Airway Management Planned: Oral ETT  Additional Equipment: None  Intra-op Plan:   Post-operative Plan: Extubation in OR  Informed Consent: I have reviewed the patients History and Physical, chart, labs and discussed the procedure including the risks, benefits and alternatives for the proposed anesthesia with the patient or authorized  representative who has indicated his/her understanding and acceptance.     Dental advisory given  Plan Discussed with: CRNA and Anesthesiologist  Anesthesia Plan Comments:         Anesthesia Quick Evaluation

## 2024-04-29 ENCOUNTER — Encounter

## 2024-04-29 ENCOUNTER — Encounter (HOSPITAL_COMMUNITY): Payer: Self-pay | Admitting: General Surgery

## 2024-04-29 DIAGNOSIS — K810 Acute cholecystitis: Secondary | ICD-10-CM | POA: Diagnosis present

## 2024-04-29 LAB — COMPREHENSIVE METABOLIC PANEL WITH GFR
ALT: 71 U/L — ABNORMAL HIGH (ref 0–44)
AST: 89 U/L — ABNORMAL HIGH (ref 15–41)
Albumin: 2.6 g/dL — ABNORMAL LOW (ref 3.5–5.0)
Alkaline Phosphatase: 90 U/L (ref 38–126)
Anion gap: 13 (ref 5–15)
BUN: 17 mg/dL (ref 8–23)
CO2: 25 mmol/L (ref 22–32)
Calcium: 9 mg/dL (ref 8.9–10.3)
Chloride: 99 mmol/L (ref 98–111)
Creatinine, Ser: 1.06 mg/dL (ref 0.61–1.24)
GFR, Estimated: >60 mL/min (ref >60)
Glucose, Bld: 175 mg/dL — ABNORMAL HIGH (ref 70–99)
Potassium: 4.4 mmol/L (ref 3.5–5.1)
Sodium: 137 mmol/L (ref 135–145)
Total Bilirubin: 2.4 mg/dL — ABNORMAL HIGH (ref 0.0–1.2)
Total Protein: 7.1 g/dL (ref 6.5–8.1)

## 2024-04-29 LAB — CBC
HCT: 42.6 % (ref 39.0–52.0)
Hemoglobin: 13.8 g/dL (ref 13.0–17.0)
MCH: 31.9 pg (ref 26.0–34.0)
MCHC: 32.4 g/dL (ref 30.0–36.0)
MCV: 98.4 fL (ref 80.0–100.0)
Platelets: 312 10*3/uL (ref 150–400)
RBC: 4.33 MIL/uL (ref 4.22–5.81)
RDW: 14 % (ref 11.5–15.5)
WBC: 19 10*3/uL — ABNORMAL HIGH (ref 4.0–10.5)
nRBC: 0 % (ref 0.0–0.2)

## 2024-04-29 LAB — GLUCOSE, CAPILLARY
Glucose-Capillary: 164 mg/dL — ABNORMAL HIGH (ref 70–99)
Glucose-Capillary: 156 mg/dL — ABNORMAL HIGH (ref 70–99)
Glucose-Capillary: 161 mg/dL — ABNORMAL HIGH (ref 70–99)
Glucose-Capillary: 148 mg/dL — ABNORMAL HIGH (ref 70–99)
Glucose-Capillary: 190 mg/dL — ABNORMAL HIGH (ref 70–99)

## 2024-04-29 MED ORDER — AMOXICILLIN-POT CLAVULANATE 875-125 MG PO TABS: 1.0000 | ORAL_TABLET | Freq: Two times a day (BID) | ORAL | 0 refills | Status: AC

## 2024-04-29 MED ORDER — HYDROCODONE-ACETAMINOPHEN 5-325 MG PO TABS: 1.0000 | ORAL_TABLET | Freq: Four times a day (QID) | ORAL | 0 refills | Status: DC | PRN

## 2024-04-29 MED ORDER — POLYETHYLENE GLYCOL 3350 17 G PO PACK
17.0000 g | PACK | Freq: Every day | ORAL | Status: DC | PRN
Start: 2024-04-29 — End: 2024-06-03

## 2024-04-29 NOTE — Plan of Care (Signed)

## 2024-04-29 NOTE — Discharge Instructions (Addendum)
 LAPAROSCOPIC SURGERY: POST OP INSTRUCTIONS Always review your discharge instruction sheet given to you by the facility where your surgery was performed. IF YOU HAVE DISABILITY OR FAMILY LEAVE FORMS, YOU MUST BRING THEM TO THE OFFICE FOR PROCESSING.   DO NOT GIVE THEM TO YOUR DOCTOR.  PAIN CONTROL  First take acetaminophen  (Tylenol ) AND/or ibuprofen (Advil) to control your pain after surgery.  Follow directions on package.  Taking acetaminophen  (Tylenol ) and/or ibuprofen (Advil) regularly after surgery will help to control your pain and lower the amount of prescription pain medication you may need.  You should not take more than 3,000 mg (3 grams) of acetaminophen  (Tylenol ) in 24 hours.  You should not take ibuprofen (Advil), aleve, motrin, naprosyn or other NSAIDS if you have a history of stomach ulcers or chronic kidney disease.  A prescription for pain medication may be given to you upon discharge.  Take your pain medication as prescribed, if you still have uncontrolled pain after taking acetaminophen  (Tylenol ) or ibuprofen (Advil). Use ice packs to help control pain. If you need a refill on your pain medication, please contact your pharmacy.  They will contact our office to request authorization. Prescriptions will not be filled after 5pm or on week-ends.  HOME MEDICATIONS Take your usually prescribed medications unless otherwise directed.  DIET You should follow a light diet the first few days after arrival home.  Be sure to include lots of fluids daily. Avoid fatty, fried foods.   CONSTIPATION It is common to experience some constipation after surgery and if you are taking pain medication.  Increasing fluid intake and taking a stool softener (such as Colace) will usually help or prevent this problem from occurring.  A mild laxative (Milk of Magnesia or Miralax) should be taken according to package instructions if there are no bowel movements after 48 hours.  WOUND/INCISION CARE Most  patients will experience some swelling and bruising in the area of the incisions.  Ice packs will help.  Swelling and bruising can take several days to resolve.  Unless discharge instructions indicate otherwise, follow guidelines below  STERI-STRIPS - you may remove your outer bandages 48 hours after surgery, and you may shower at that time.  You have steri-strips (small skin tapes) in place directly over the incision.  These strips should be left on the skin for 7-10 days.   DERMABOND/SKIN GLUE - you may shower in 24 hours.  The glue will flake off over the next 2-3 weeks. Any sutures or staples will be removed at the office during your follow-up visit.  ACTIVITIES You may resume regular (light) daily activities beginning the next day--such as daily self-care, walking, climbing stairs--gradually increasing activities as tolerated.  You may have sexual intercourse when it is comfortable.  Refrain from any heavy lifting or straining until approved by your doctor. You may drive when you are no longer taking prescription pain medication, you can comfortably wear a seatbelt, and you can safely maneuver your car and apply brakes.  FOLLOW-UP You should see your doctor in the office for a follow-up appointment approximately 2-3 weeks after your surgery.  You should have been given your post-op/follow-up appointment when your surgery was scheduled.  If you did not receive a post-op/follow-up appointment, make sure that you call for this appointment within a day or two after you arrive home to insure a convenient appointment time.   WHEN TO CALL YOUR DOCTOR: Fever over 101.0 Inability to urinate Continued bleeding from incision. Increased pain, redness, or drainage  from the incision. Increasing abdominal pain  The clinic staff is available to answer your questions during regular business hours.  Please don't hesitate to call and ask to speak to one of the nurses for clinical concerns.  If you have a  medical emergency, go to the nearest emergency room or call 911.  A surgeon from [facility] is always on call at the hospital. [address], [address], [place], [place]  [address] ? [address] [address], [place], [place]   [address] [phone] ? [phone] ? FAX [phone] Web site: [email or web address]   Social Connections   [facility] (Adult Program)  - Address: [address] [place], [place] [address] - General office #:  [phone] - Enrollment Phone #: [phone]  [facility]  - Senior Friendship Line: call toll free, available 24 hours a day, at [phone]   [place] [address]  [place] 2-1-1 is another useful way to locate resources in the community. Visit [employer]hed[employer] to find service information online. If you need additional assistance, 2-1-1 Referral Specialists are available 24 hours a day, every day by dialing 2-1-1 or [phone] from any phone. The call is free, confidential, and available in any language.  -[facility] [place]: [phone] / [address], [place], [place] [address]  -Dial 988: Talk lifeline 24/7.  -[place]  - [facility] Warmline: 908-230-8242  -[facility]: [address], [place] [address]; [phone]  [employer] The [employer] hikes each Wednesday morning from September to June at locations throughout [place] and surrounding areas. New and returning members must qualify for membership each year by walking around the pond at [facility] twice (3.2 miles) in under 70 minutes. An annual membership fee of $10 is due at qualifying. For more information, call [phone].  Hours of Operation Mondays to Thursdays: 8 am to 8 pm Fridays: 9 am to 8 pm Saturdays: 9 am to 1 pm Sundays: Closed

## 2024-04-29 NOTE — TOC CM/SW Note (Addendum)
 Transition of Care [facility]) - Inpatient Brief Assessment   Patient Details  Name: Benjamin Patton MRN: [id number] Date of Birth: May 07, 1951  Transition of [facility]) CM/SW Contact:    Juliane Och, LCSW Phone Number: 04/29/2024, 10:04 AM   Clinical Narrative:  10:04 AM Per chart review, patient resides at home. Patient has a PCP and insurance. Patient does not have SNF/HH/DME history. CSW provided SDOH (social connections) resources. No other TOC needs were identified at this time. TOC will continue to follow and be available to assist.  Transition of Care Asessment: Insurance and Status: Insurance coverage has been reviewed Patient has primary care physician: Yes Home environment has been reviewed: Private Residence Prior level of function:: N/A Prior/Current Home Services: No current home services Social Drivers of Health Review: SDOH reviewed interventions complete Readmission risk has been reviewed: Yes Transition of care needs: no transition of care needs at this time

## 2024-04-29 NOTE — Plan of Care (Signed)
  Problem: Education: Goal: Ability to describe self-care measures that may prevent or decrease complications (Diabetes Survival Skills Education) will improve 04/29/2024 1820 by Rosi Converse, RN Outcome: Progressing 04/29/2024 1643 by Rosi Converse, RN Outcome: Progressing Goal: Individualized Educational Video(s) Outcome: Progressing   Problem: Coping: Goal: Ability to adjust to condition or change in health will improve 04/29/2024 1820 by Rosi Converse, RN Outcome: Progressing 04/29/2024 1643 by Rosi Converse, RN Outcome: Progressing   Problem: Fluid Volume: Goal: Ability to maintain a balanced intake and output will improve Outcome: Progressing

## 2024-04-29 NOTE — Progress Notes (Signed)
 Progress Note   Patient: Benjamin Patton:[id number] DOB: 01-20-1951 DOA: 04/27/2024     1 DOS: the patient was seen and examined on 04/29/2024   Brief hospital course: 72yo with h/o HTN, HLD, asthma, and DM who presented on 6/2 with RUQ pain. RUQ US  with gallbladder distention, CT with asymmetric wall thickening in the antral region of the stomach. Surgery consulted. Given Ceftriaxone.  Underwent lap chole on 6/3 for acute gangrenous cholecystitis.  Assessment and Plan:  Acute gangrenous cholecystitis Presented with 4 to 5 days of progressive RUQ pain with associated SOB RUQ U/S showed biliary sludge and gallbladder wall thickening Patient with RUQ tenderness and positive Murphy sign on exam Meets sepsis criteria with tachycardic, tachypnea, lactic acidosis, leukocytosis and evidence of acute cholecystitis General Surgery consulted, cholecystectomy on 6/3 with findings of acute gangrenous cholecystitis Continue Ceftriaxone Pain control with PRN IV Dilaudid Blood cultures NTD   AKI Increased creatinine to 1.4 on admission, resolved Likely secondary to mild dehydration in the setting of acute infection and decreased p.o. intake Avoid nephrotoxic agents   Elevated bilirubin Significant rise in bilirubin from 1.7 on 5/31 to 4.9 on admission, improved to 2.4 this AM No intrahepatic ductal dilatation on abdominal imaging Likely secondary to acute cholecystitis   Type 2 diabetes A1c 14.9%, very poor control Q4H SSI with CBG monitoring while NPO Hold glipizide , pioglitazone  while inpatient Continue glargine   Constipation Reports no bowel movement over the last 4 to 5 days Also states he is not have much food due to decreased appetite Start scheduled MiraLAX and Senokot-S   HTN BP elevated with SBP in the 130s to 150s Likely worsened with abdominal pain Resume amlodipine  Hold benazepril  in the setting of AKI   HLD Lipid panel 2 months ago showed total cholesterol of 218 and  LDL of 156 Hold rosuvastatin  in the setting of abnormal liver function   Asymmetric stomach wall thickening CT on 5/31 showed an "asymmetric wall thickening in the antral region in the stomach. Based on sagittal imaging, this may be within normal limits, but mucosal lesion is not excluded. GI follow-up recommended and upper endoscopy may be warranted." Patient has GI follow-up scheduled for 05/13/2024 with Dr. Elvin Hammer         Consultants: Surgery   Procedures: Lap chole with intraoperative cholangiogram 6/3   Antibiotics: Ceftriaxone 6/2-    30 Day Unplanned Readmission Risk Score    Flowsheet Row ED to Hosp-Admission (Current) from 04/27/2024 in [facility] GENERAL MED/SURG UNIT  30 Day Unplanned Readmission Risk Score (%) 10.95 Filed at 04/28/2024 1600       This score is the patient's risk of an unplanned readmission within 30 days of being discharged (0 -100%). The score is based on dignosis, age, lab data, medications, orders, and past utilization.   Low:  0-14.9   Medium: 15-21.9   High: 22-29.9   Extreme: 30 and above           Subjective: Feeling much better, still with abdominal soreness.  Has had minimal PO intake as of yet.  Thinks he  needs one more day of monitoring.   Objective: Vitals:   04/29/24 0423 04/29/24 0759  BP: 129/89 126/83  Pulse: 74 90  Resp: 16 16  Temp: 98.4 F (36.9 C) 98 F (36.7 C)  SpO2: 96% 95%    Intake/Output Summary (Last 24 hours) at 04/29/2024 1352 Last data filed at 04/29/2024 8119 Gross per 24 hour  Intake 1013.01 ml  Output --  Net 1013.01 ml   Filed Weights   04/27/24 1555 04/28/24 1324  Weight: 82.1 kg 82.1 kg    Exam:  General:  Appears calm and comfortable and is in NAD Eyes:   normal lids, iris ENT:  grossly normal hearing, lips & tongue, mmm Cardiovascular:  RRR, +diastolic murmur. No LE edema.  Respiratory:   CTA bilaterally with no wheezes/rales/rhonchi.  Normal respiratory effort. Abdomen:    distended, diffusely TTP worse in BUQ Skin:  no rash or induration seen on limited exam Musculoskeletal:  grossly normal tone BUE/BLE, good ROM, no bony abnormality Psychiatric:  blunted mood and affect, speech fluent and appropriate, AOx3 Neurologic:  CN 2-12 grossly intact, moves all extremities in coordinated fashion  Data Reviewed: I have reviewed the patient's lab results since admission.  Pertinent labs for today include:   Glucose 175 Albumin 2.6 AST 89/ALT 71/Bili 2.4 WBC 19, down from 19.6     Family Communication: Significant other was present  Disposition: Status is: Inpatient Admit - It is my clinical opinion that admission to INPATIENT is reasonable and necessary because of the expectation that this patient will require hospital care that crosses at least 2 midnights to treat this condition based on the medical complexity of the problems presented.  Given the aforementioned information, the predictability of an adverse outcome is felt to be significant.      Time spent: 50 minutes  Unresulted Labs (From admission, onward)     Start     Ordered   04/30/24 0500  Comprehensive metabolic panel with GFR  Tomorrow morning,   R        04/29/24 1352   04/30/24 0500  CBC with Differential/Platelet  Tomorrow morning,   R        04/29/24 1352             Author: Lorita Rosa, MD 04/29/2024 1:52 PM  For on call review [email or web address].

## 2024-04-29 NOTE — Hospital Course (Signed)
 72yo with h/o HTN, HLD, asthma, and DM who presented on 6/2 with RUQ pain. RUQ US  with gallbladder distention, CT with asymmetric wall thickening in the antral region of the stomach. Surgery consulted. Given Ceftriaxone.  Underwent lap chole on 6/3 for acute gangrenous cholecystitis.

## 2024-04-29 NOTE — Care Management Obs Status (Signed)
 MEDICARE OBSERVATION STATUS NOTIFICATION   Patient Details  Name: Benjamin Patton MRN: [id number] Date of Birth: January 18, 1951   Medicare Observation Status Notification Given:  Yes  Moon/Obs letter signed and copy given  Wynonia Hedges 04/29/2024, 9:46 AM

## 2024-04-29 NOTE — Progress Notes (Signed)
 Progress Note  1 Day Post-Op  Subjective: Tol CLD without nausea or vomiting. Passing flatus. Ambulating.   Objective: Vital signs in last 24 hours: Temp:  [97.8 F (36.6 C)-99.9 F (37.7 C)] 98 F (36.7 C) (06/04 0759) Pulse Rate:  [74-90] 90 (06/04 0759) Resp:  [16-33] 16 (06/04 0759) BP: (126-151)/(83-103) 126/83 (06/04 0759) SpO2:  [92 %-97 %] 95 % (06/04 0759) Last BM Date : 04/27/24  Intake/Output from previous day: 06/03 0701 - 06/04 0700 In: 1013 [I.V.:963; IV Piggyback:50] Out: -  Intake/Output this shift: No intake/output data recorded.  PE: General: pleasant, WD, WN male who is laying in bed in NAD HEENT: sclera anicteric  Heart: regular, rate, and rhythm.   Lungs: Respiratory effort nonlabored Abd: soft, appropriately ttp, ND, incisions C/D/I Psych: A&Ox3 with an appropriate affect.    Lab Results:  Recent Labs    04/28/24 0536 04/29/24 0445  WBC 19.6* 19.0*  HGB 14.5 13.8  HCT 42.8 42.6  PLT 257 312   BMET Recent Labs    04/28/24 0536 04/29/24 0445  NA 135 137  K 3.5 4.4  CL 103 99  CO2 23 25  GLUCOSE 169* 175*  BUN 19 17  CREATININE 1.15 1.06  CALCIUM  8.5* 9.0   PT/INR No results for input(s): "LABPROT", "INR" in the last 72 hours. CMP     Component Value Date/Time   NA 137 04/29/2024 0445   K 4.4 04/29/2024 0445   CL 99 04/29/2024 0445   CO2 25 04/29/2024 0445   GLUCOSE 175 (H) 04/29/2024 0445   BUN 17 04/29/2024 0445   CREATININE 1.06 04/29/2024 0445   CREATININE 0.94 04/07/2015 1015   CALCIUM  9.0 04/29/2024 0445   PROT 7.1 04/29/2024 0445   ALBUMIN 2.6 (L) 04/29/2024 0445   AST 89 (H) 04/29/2024 0445   ALT 71 (H) 04/29/2024 0445   ALKPHOS 90 04/29/2024 0445   BILITOT 2.4 (H) 04/29/2024 0445   GFRNONAA >60 04/29/2024 0445   GFRAA 129 01/10/2009 1531   Lipase     Component Value Date/Time   LIPASE 21 04/27/2024 1635       Studies/Results: US  Abdomen Limited RUQ (LIVER/GB) Result Date: 04/27/2024 CLINICAL  DATA:  151470 RUQ abdominal pain 151470 EXAM: ULTRASOUND ABDOMEN LIMITED RIGHT UPPER QUADRANT COMPARISON:  04/25/2024. FINDINGS: Gallbladder: Wall thickening. Echogenic sludge. No pericholecystic fluid. No shadowing stones. Common bile duct: Diameter: 0.4 cm. Liver: Liver is hyperechoic consistent with fatty infiltration. No focal hepatic lesions identified. No intrahepatic ductal dilatation. Hepatopetal portal vein flow. IMPRESSION: Gallbladder sludge and wall thickening. Fatty infiltration of the liver. Electronically Signed   By: Sydell Eva M.D.   On: 04/27/2024 19:16   DG Chest 1 View Result Date: 04/27/2024 CLINICAL DATA:  141880 SOB (shortness of breath) 141880 EXAM: CHEST  1 VIEW COMPARISON:  November 08, 2020 FINDINGS: Low lung volumes with streaky bibasilar atelectasis. No lobar consolidation, pleural effusion, or pneumothorax. No cardiomegaly. No acute fracture or destructive lesion. Multilevel thoracic osteophytosis. IMPRESSION: Low lung volumes with streaky bibasilar atelectasis. Electronically Signed   By: Rance Burrows M.D.   On: 04/27/2024 18:14    Anti-infectives: Anti-infectives (From admission, onward)    Start     Dose/Rate Route Frequency Ordered Stop   04/29/24 0000  amoxicillin-clavulanate (AUGMENTIN) 875-125 MG tablet        1 tablet Oral 2 times daily 04/29/24 0932 05/04/24 2359   04/28/24 2300  cefTRIAXone (ROCEPHIN) 2 g in sodium chloride  0.9 % 100 mL IVPB  Status:  Discontinued        2 g 200 mL/hr over 30 Minutes Intravenous Every 24 hours 04/28/24 0226 04/28/24 1830   04/28/24 1900  piperacillin-tazobactam (ZOSYN) IVPB 3.375 g        3.375 g 12.5 mL/hr over 240 Minutes Intravenous Every 8 hours 04/28/24 1623 05/03/24 2159   04/27/24 2300  cefTRIAXone (ROCEPHIN) 2 g in sodium chloride  0.9 % 100 mL IVPB        2 g 200 mL/hr over 30 Minutes Intravenous  Once 04/27/24 2252 04/27/24 2330        Assessment/Plan Acute cholecystitis  POD1 S/P laparoscopic  cholecystectomy Dr. Melton Squires - tolerating CLD - advance to reg diet  - continue to mobilize - LFTs trending down - continue abx - will DC home on 5 days PO abx - stable for DC from surgical standpoint when medically cleared   FEN: reg diet  VTE: LMWH ID: [email or web address]  LOS: 1 day     Annetta Killian, [facility] 04/29/2024, 1:53 PM Please see Amion for pager number during day hours 7:00am-4:30pm

## 2024-04-29 NOTE — Plan of Care (Signed)

## 2024-04-30 ENCOUNTER — Inpatient Hospital Stay (HOSPITAL_COMMUNITY)

## 2024-04-30 DIAGNOSIS — K810 Acute cholecystitis: Secondary | ICD-10-CM | POA: Diagnosis not present

## 2024-04-30 LAB — COMPREHENSIVE METABOLIC PANEL WITH GFR
ALT: 67 U/L — ABNORMAL HIGH (ref 0–44)
AST: 74 U/L — ABNORMAL HIGH (ref 15–41)
Albumin: 2.3 g/dL — ABNORMAL LOW (ref 3.5–5.0)
Alkaline Phosphatase: 84 U/L (ref 38–126)
Anion gap: 7 (ref 5–15)
BUN: 13 mg/dL (ref 8–23)
CO2: 27 mmol/L (ref 22–32)
Calcium: 8.6 mg/dL — ABNORMAL LOW (ref 8.9–10.3)
Chloride: 103 mmol/L (ref 98–111)
Creatinine, Ser: 0.93 mg/dL (ref 0.61–1.24)
GFR, Estimated: >60 mL/min (ref >60)
Glucose, Bld: 134 mg/dL — ABNORMAL HIGH (ref 70–99)
Potassium: 3.3 mmol/L — ABNORMAL LOW (ref 3.5–5.1)
Sodium: 137 mmol/L (ref 135–145)
Total Bilirubin: 1.1 mg/dL (ref 0.0–1.2)
Total Protein: 6.4 g/dL — ABNORMAL LOW (ref 6.5–8.1)

## 2024-04-30 LAB — CBC WITH DIFFERENTIAL/PLATELET
Abs Immature Granulocytes: 0.08 10*3/uL — ABNORMAL HIGH (ref 0.00–0.07)
Basophils Absolute: 0 10*3/uL (ref 0.0–0.1)
Basophils Relative: 0 %
Eosinophils Absolute: 0 10*3/uL (ref 0.0–0.5)
Eosinophils Relative: 0 %
HCT: 40.8 % (ref 39.0–52.0)
Hemoglobin: 13.4 g/dL (ref 13.0–17.0)
Immature Granulocytes: 1 %
Lymphocytes Relative: 18 %
Lymphs Abs: 2.9 10*3/uL (ref 0.7–4.0)
MCH: 31.9 pg (ref 26.0–34.0)
MCHC: 32.8 g/dL (ref 30.0–36.0)
MCV: 97.1 fL (ref 80.0–100.0)
Monocytes Absolute: 2.5 10*3/uL — ABNORMAL HIGH (ref 0.1–1.0)
Monocytes Relative: 15 %
Neutro Abs: 10.8 10*3/uL — ABNORMAL HIGH (ref 1.7–7.7)
Neutrophils Relative %: 66 %
Platelets: 322 10*3/uL (ref 150–400)
RBC: 4.2 MIL/uL — ABNORMAL LOW (ref 4.22–5.81)
RDW: 14.1 % (ref 11.5–15.5)
WBC: 16.3 10*3/uL — ABNORMAL HIGH (ref 4.0–10.5)
nRBC: 0 % (ref 0.0–0.2)

## 2024-04-30 LAB — GLUCOSE, CAPILLARY
Glucose-Capillary: 105 mg/dL — ABNORMAL HIGH (ref 70–99)
Glucose-Capillary: 117 mg/dL — ABNORMAL HIGH (ref 70–99)
Glucose-Capillary: 136 mg/dL — ABNORMAL HIGH (ref 70–99)
Glucose-Capillary: 143 mg/dL — ABNORMAL HIGH (ref 70–99)
Glucose-Capillary: 171 mg/dL — ABNORMAL HIGH (ref 70–99)
Glucose-Capillary: 191 mg/dL — ABNORMAL HIGH (ref 70–99)
Glucose-Capillary: 193 mg/dL — ABNORMAL HIGH (ref 70–99)

## 2024-04-30 LAB — SURGICAL PATHOLOGY

## 2024-04-30 MED ORDER — LACTATED RINGERS IV SOLN: INTRAVENOUS | Status: DC

## 2024-04-30 MED ORDER — SMOG ENEMA
960.0000 mL | Freq: Once | RECTAL | Status: AC
  Administered 2024-04-30: 960 mL via RECTAL
  Filled 2024-04-30 (×2): qty 960

## 2024-04-30 MED ORDER — BENAZEPRIL HCL 20 MG PO TABS
20.0000 mg | ORAL_TABLET | Freq: Every day | ORAL | Status: DC
  Administered 2024-05-01 – 2024-05-02 (×2): 20 mg via ORAL
  Filled 2024-04-30 (×2): qty 1

## 2024-04-30 MED ORDER — BISACODYL 10 MG RE SUPP
10.0000 mg | Freq: Once | RECTAL | Status: AC
  Administered 2024-04-30: 10 mg via RECTAL
  Filled 2024-04-30: qty 1

## 2024-04-30 MED ORDER — SODIUM CHLORIDE 0.9 % IV SOLN
12.5000 mg | Freq: Four times a day (QID) | INTRAVENOUS | Status: DC | PRN
  Administered 2024-04-30: 12.5 mg via INTRAVENOUS
  Filled 2024-04-30: qty 0.5

## 2024-04-30 MED ORDER — POTASSIUM CHLORIDE CRYS ER 20 MEQ PO TBCR
40.0000 meq | EXTENDED_RELEASE_TABLET | Freq: Once | ORAL | Status: AC
  Administered 2024-04-30: 40 meq via ORAL
  Filled 2024-04-30: qty 2

## 2024-04-30 NOTE — Progress Notes (Signed)
 Progress Note   Patient: Benjamin Patton YQM:[id number] DOB: 1951-10-13 DOA: 04/27/2024     2 DOS: the patient was seen and examined on 04/30/2024   Brief hospital course: 72yo with h/o HTN, HLD, asthma, and DM who presented on 6/2 with RUQ pain. RUQ US  with gallbladder distention, CT with asymmetric wall thickening in the antral region of the stomach. Surgery consulted. Given Ceftriaxone.  Underwent lap chole on 6/3 for acute gangrenous cholecystitis.  Assessment and Plan:  Acute gangrenous cholecystitis Presented with 4 to 5 days of progressive RUQ pain with associated SOB RUQ U/S showed biliary sludge and gallbladder wall thickening Patient with RUQ tenderness and positive Murphy sign on exam Met sepsis criteria with tachycardic, tachypnea, lactic acidosis, leukocytosis and evidence of acute cholecystitis General Surgery consulted, cholecystectomy on 6/3 with findings of acute gangrenous cholecystitis Continue Ceftriaxone Pain control with PRN IV Dilaudid Blood cultures NTD Follow LFTs to ensure normalization as an outpatient Emesis yesterday, AAS with probable ileus and moderate stool burden   AKI, resolved Increased creatinine to 1.4 on admission, now back to baseline Likely secondary to mild dehydration in the setting of acute infection and decreased p.o. intake Avoid nephrotoxic agents   Elevated bilirubin Significant rise in bilirubin from 1.7 on 5/31 to 4.9 on admission, improving No intrahepatic ductal dilatation on abdominal imaging Likely secondary to acute cholecystitis LFTs will need to be followed back to normal   Type 2 diabetes A1c 14.9%, very poor control Moderate-scale SSI with CBG monitoring Excellent control while hospitalized Hold glipizide , pioglitazone  while inpatient Continue glargine   Constipation Reports no bowel movement over the last 4 to 5 days Also states he is not have much food due to decreased appetite Start scheduled MiraLAX and  Senokot-S Emesis x 1 post-Miralax AXR with constipation   HTN BP elevated with SBP in the 130s to 150s Likely worsened with abdominal pain Resume amlodipine  Held benazepril  in the setting of AKI, will restart in AM   HLD Lipid panel 2 months ago showed total cholesterol of 218 and LDL of 156 Hold rosuvastatin  in the setting of abnormal liver function   Asymmetric stomach wall thickening CT on 5/31 showed an "asymmetric wall thickening in the antral region in the stomach. Based on sagittal imaging, this may be within normal limits, but mucosal lesion is not excluded. GI follow-up recommended and upper endoscopy may be warranted." Patient has GI follow-up scheduled for 05/13/2024 with Dr. Elvin Hammer         Consultants: Surgery   Procedures: Lap chole with intraoperative cholangiogram 6/3   Antibiotics: Ceftriaxone 6/2-  30 Day Unplanned Readmission Risk Score    Flowsheet Row ED to Hosp-Admission (Current) from 04/27/2024 in [facility] GENERAL MED/SURG UNIT  30 Day Unplanned Readmission Risk Score (%) 11.75 Filed at 04/30/2024 0801       This score is the patient's risk of an unplanned readmission within 30 days of being discharged (0 -100%). The score is based on dignosis, age, lab data, medications, orders, and past utilization.   Low:  0-14.9   Medium: 15-21.9   High: 22-29.9   Extreme: 30 and above           Subjective: Took Miralax last night and vomited.  +flatus, no BM,  +abdominal distention.   Objective: Vitals:   04/30/24 0739 04/30/24 1011  BP: 134/89 (!) 135/92  Pulse: 78   Resp:    Temp: 98 F (36.7 C)   SpO2: 95%  Intake/Output Summary (Last 24 hours) at 04/30/2024 1301 Last data filed at 04/30/2024 0521 Gross per 24 hour  Intake 150 ml  Output --  Net 150 ml   Filed Weights   04/27/24 1555 04/28/24 1324  Weight: 82.1 kg 82.1 kg    Exam:  General:  Appears calm and comfortable and is in NAD Eyes:   normal lids, iris ENT:  grossly  normal hearing, lips & tongue, mmm Cardiovascular:  RRR, +diastolic murmur. No LE edema.  Respiratory:   CTA bilaterally with no wheezes/rales/rhonchi.  Normal respiratory effort. Abdomen:   distended, diffusely TTP worse in BUQ Skin:  no rash or induration seen on limited exam Musculoskeletal:  grossly normal tone BUE/BLE, good ROM, no bony abnormality Psychiatric:  blunted mood and affect, speech fluent and appropriate, AOx3 Neurologic:  CN 2-12 grossly intact, moves all extremities in coordinated fashion  Data Reviewed: I have reviewed the patient's lab results since admission.  Pertinent labs for today include:   K+ 3.3 Glucose 134 Albumin 2.3 AST 74/ALT 67/Bili 1.1, improving WBC 16.3, down from 19       Family Communication: Significant other was present throughout evaluation  Disposition: Status is: Inpatient Remains inpatient appropriate because: ongoing monitoring     Time spent: 50 minutes  Unresulted Labs (From admission, onward)     Start     Ordered   05/01/24 0500  CBC with Differential/Platelet  Tomorrow morning,   R        04/30/24 1301   05/01/24 0500  Basic metabolic panel with GFR  Tomorrow morning,   R        04/30/24 1301             Author: Lorita Rosa, MD 04/30/2024 1:01 PM  For on call review [email or web address].

## 2024-04-30 NOTE — Plan of Care (Signed)
   Problem: Coping: Goal: Ability to adjust to condition or change in health will improve Outcome: Progressing   Problem: Fluid Volume: Goal: Ability to maintain a balanced intake and output will improve Outcome: Progressing

## 2024-04-30 NOTE — Progress Notes (Signed)
 Patient left for xray.

## 2024-04-30 NOTE — Plan of Care (Signed)
   Problem: Coping: Goal: Ability to adjust to condition or change in health will improve Outcome: Progressing

## 2024-04-30 NOTE — Progress Notes (Signed)
 Patient ID: Benjamin Patton, male   DOB: 1951/06/02, 73 y.o.   MRN: [id number]   Acute Care Surgery Service Progress Note:    Chief Complaint/Subjective: Feels bloated N/v last pm after miralax Some flatus. No BM Equal amounts flatus/burping  Objective: Vital signs in last 24 hours: Temp:  [98 F (36.7 C)-100 F (37.8 C)] 98 F (36.7 C) (06/05 0739) Pulse Rate:  [73-94] 78 (06/05 0739) Resp:  [16] 16 (06/05 0422) BP: (130-146)/(80-97) 134/89 (06/05 0739) SpO2:  [94 %-96 %] 95 % (06/05 0739) Last BM Date : 04/27/24  Intake/Output from previous day: 06/04 0701 - 06/05 0700 In: 150 [IV Piggyback:150] Out: -  Intake/Output this shift: No intake/output data recorded.  Lungs:nonlabored  Cardiovascular: reg  Abd: soft, bloated/distended, incisions ok  Extremities: no edema, +SCDs  Neuro: alert, nonfocal  Lab Results: CBC  Recent Labs    04/29/24 0445 04/30/24 0505  WBC 19.0* 16.3*  HGB 13.8 13.4  HCT 42.6 40.8  PLT 312 322   BMET Recent Labs    04/29/24 0445 04/30/24 0505  NA 137 137  K 4.4 3.3*  CL 99 103  CO2 25 27  GLUCOSE 175* 134*  BUN 17 13  CREATININE 1.06 0.93  CALCIUM  9.0 8.6*   LFT    Latest Ref Rng & Units 04/30/2024    5:05 AM 04/29/2024    4:45 AM 04/28/2024    5:36 AM  Hepatic Function  Total Protein 6.5 - 8.1 g/dL 6.4  7.1  7.4   Albumin 3.5 - 5.0 g/dL 2.3  2.6  2.9   AST 15 - 41 U/L 74  89  39   ALT 0 - 44 U/L 67  71  35   Alk Phosphatase 38 - 126 U/L 84  90  86   Total Bilirubin 0.0 - 1.2 mg/dL 1.1  2.4  3.3   Bilirubin, Direct 0.0 - 0.2 mg/dL   1.1    PT/INR No results for input(s): "LABPROT", "INR" in the last 72 hours. ABG No results for input(s): "PHART", "HCO3" in the last 72 hours.  Invalid input(s): "PCO2", "PO2"  Studies/Results:  Anti-infectives: Anti-infectives (From admission, onward)    Start     Dose/Rate Route Frequency Ordered Stop   04/29/24 0000  amoxicillin-clavulanate (AUGMENTIN) 875-125 MG tablet         1 tablet Oral 2 times daily 04/29/24 0932 05/04/24 2359   04/28/24 2300  cefTRIAXone (ROCEPHIN) 2 g in sodium chloride  0.9 % 100 mL IVPB  Status:  Discontinued        2 g 200 mL/hr over 30 Minutes Intravenous Every 24 hours 04/28/24 0226 04/28/24 1830   04/28/24 1900  piperacillin-tazobactam (ZOSYN) IVPB 3.375 g        3.375 g 12.5 mL/hr over 240 Minutes Intravenous Every 8 hours 04/28/24 1623 05/03/24 2159   04/27/24 2300  cefTRIAXone (ROCEPHIN) 2 g in sodium chloride  0.9 % 100 mL IVPB        2 g 200 mL/hr over 30 Minutes Intravenous  Once 04/27/24 2252 04/27/24 2330       Medications: Scheduled Meds:  amLODipine   10 mg Oral Daily   enoxaparin (LOVENOX) injection  40 mg Subcutaneous Q24H   insulin aspart  0-15 Units Subcutaneous Q4H   insulin glargine -yfgn  20 Units Subcutaneous Daily   polyethylene glycol  17 g Oral Daily   potassium chloride  40 mEq Oral Once   senna-docusate  1 tablet Oral QHS   Continuous  Infusions:  piperacillin-tazobactam (ZOSYN)  IV 3.375 g (04/30/24 0521)   promethazine (PHENERGAN) injection (IM or IVPB) 12.5 mg (04/30/24 0134)   PRN Meds:.acetaminophen  **OR** acetaminophen , HYDROcodone -acetaminophen , HYDROmorphone (DILAUDID) injection, ondansetron **OR** ondansetron (ZOFRAN) IV, promethazine (PHENERGAN) injection (IM or IVPB)  Assessment/Plan: Patient Active Problem List   Diagnosis Date Noted   Acute gangrenous cholecystitis 04/29/2024   Acute cholecystitis 04/28/2024   Sepsis (HCC) 04/28/2024   Thickening of wall of gallbladder 04/28/2024   Elevated bilirubin 04/28/2024   Leucocytosis 04/28/2024   Tachycardia 04/28/2024   Generalized abdominal pain 04/28/2024   SOB (shortness of breath) 04/28/2024   S/P laparoscopic cholecystectomy 04/28/2024   Encounter for well adult exam with abnormal findings 02/26/2024   Aortic atherosclerosis (HCC) 11/10/2020   Vitamin D  deficiency 02/02/2020   History of splenectomy 06/30/2018   Degenerative  arthritis of left knee 06/30/2018   Nonspecific chest pain 04/08/2015   Diabetes (HCC) 07/07/2014   Lower back pain 05/06/2013   Renal cyst, left 03/22/2010   NEPHROLITHIASIS, HX OF 02/13/2010   Environmental allergies 01/10/2009   ERECTILE DYSFUNCTION 12/16/2007   HYPERCHOLESTEROLEMIA 09/20/2007   Essential hypertension 09/20/2007   COLECTOMY, HX OF 09/20/2007   INGUINAL HERNIORRHAPHY, HX OF 09/20/2007   s/p Procedure(s): LAPAROSCOPIC CHOLECYSTECTOMY WITH INTRAOPERATIVE CHOLANGIOGRAM 04/28/2024  Acute cholecystitis  POD2 S/P laparoscopic cholecystectomy Dr. Melton Squires - had n/v last night.   -will AAS - continue to mobilize - LFTs trending down - continue abx - will DC home on 5 days PO abx -    FEN: back to CLD, prob restart IVF, TRH to replace potassium VTE: LMWH ID: [email or web address]  LOS: 1 day  Disposition: not reay for discharge  LOS: 2 days    Marianna Shirk. Elvan Hamel, MD, FACS General, Bariatric, & Minimally Invasive Surgery [phone] [facility], [facility]

## 2024-05-01 DIAGNOSIS — K810 Acute cholecystitis: Secondary | ICD-10-CM | POA: Diagnosis not present

## 2024-05-01 LAB — GLUCOSE, CAPILLARY
Glucose-Capillary: 119 mg/dL — ABNORMAL HIGH (ref 70–99)
Glucose-Capillary: 107 mg/dL — ABNORMAL HIGH (ref 70–99)
Glucose-Capillary: 107 mg/dL — ABNORMAL HIGH (ref 70–99)
Glucose-Capillary: 173 mg/dL — ABNORMAL HIGH (ref 70–99)
Glucose-Capillary: 176 mg/dL — ABNORMAL HIGH (ref 70–99)

## 2024-05-01 LAB — BASIC METABOLIC PANEL WITH GFR
Anion gap: 10 (ref 5–15)
BUN: 10 mg/dL (ref 8–23)
CO2: 24 mmol/L (ref 22–32)
Calcium: 8.6 mg/dL — ABNORMAL LOW (ref 8.9–10.3)
Chloride: 105 mmol/L (ref 98–111)
Creatinine, Ser: 0.85 mg/dL (ref 0.61–1.24)
GFR, Estimated: >60 mL/min (ref >60)
Glucose, Bld: 106 mg/dL — ABNORMAL HIGH (ref 70–99)
Potassium: 3.1 mmol/L — ABNORMAL LOW (ref 3.5–5.1)
Sodium: 139 mmol/L (ref 135–145)

## 2024-05-01 LAB — CBC WITH DIFFERENTIAL/PLATELET
Abs Immature Granulocytes: 0.09 10*3/uL — ABNORMAL HIGH (ref 0.00–0.07)
Basophils Absolute: 0 10*3/uL (ref 0.0–0.1)
Basophils Relative: 0 %
Eosinophils Absolute: 0.1 10*3/uL (ref 0.0–0.5)
Eosinophils Relative: 1 %
HCT: 41.5 % (ref 39.0–52.0)
Hemoglobin: 13.7 g/dL (ref 13.0–17.0)
Immature Granulocytes: 1 %
Lymphocytes Relative: 23 %
Lymphs Abs: 3.3 10*3/uL (ref 0.7–4.0)
MCH: 31.6 pg (ref 26.0–34.0)
MCHC: 33 g/dL (ref 30.0–36.0)
MCV: 95.6 fL (ref 80.0–100.0)
Monocytes Absolute: 2.1 10*3/uL — ABNORMAL HIGH (ref 0.1–1.0)
Monocytes Relative: 15 %
Neutro Abs: 8.5 10*3/uL — ABNORMAL HIGH (ref 1.7–7.7)
Neutrophils Relative %: 60 %
Platelets: 339 10*3/uL (ref 150–400)
RBC: 4.34 MIL/uL (ref 4.22–5.81)
RDW: 13.7 % (ref 11.5–15.5)
WBC: 14 10*3/uL — ABNORMAL HIGH (ref 4.0–10.5)
nRBC: 0 % (ref 0.0–0.2)

## 2024-05-01 MED ORDER — PROMETHAZINE HCL 12.5 MG RE SUPP: 12.5000 mg | Freq: Four times a day (QID) | RECTAL | Status: DC | PRN

## 2024-05-01 MED ORDER — TRAZODONE HCL 50 MG PO TABS
25.0000 mg | ORAL_TABLET | Freq: Every evening | ORAL | Status: DC | PRN
  Administered 2024-05-01: 25 mg via ORAL
  Filled 2024-05-01: qty 1

## 2024-05-01 MED ORDER — OXYCODONE HCL 5 MG PO TABS: 5.0000 mg | ORAL_TABLET | ORAL | Status: DC | PRN

## 2024-05-01 MED ORDER — POTASSIUM CHLORIDE CRYS ER 20 MEQ PO TBCR
40.0000 meq | EXTENDED_RELEASE_TABLET | ORAL | Status: AC
  Administered 2024-05-01 (×2): 40 meq via ORAL
  Filled 2024-05-01 (×2): qty 2

## 2024-05-01 NOTE — Plan of Care (Signed)
   Problem: Education: Goal: Ability to describe self-care measures that may prevent or decrease complications (Diabetes Survival Skills Education) will improve Outcome: Progressing   Problem: Coping: Goal: Ability to adjust to condition or change in health will improve Outcome: Progressing   Problem: Fluid Volume: Goal: Ability to maintain a balanced intake and output will improve Outcome: Progressing

## 2024-05-01 NOTE — Plan of Care (Signed)
 Pt alert and oriented x4. At bedrest. No c/o pain. Pt made aware of new order for rest med tonight. PIV intact and patent. Pt is ambulatory. anticipates discharge in the am. Tolerating po and IV meds. Encouraged to call for assistance as needed. Call light in reach. Sr x2 elevated. Bed in low position.

## 2024-05-01 NOTE — Progress Notes (Signed)
 Progress Note   Patient: Benjamin Patton DOB: Apr 21, 1951 DOA: 04/27/2024     3 DOS: the patient was seen and examined on 05/01/2024   Brief hospital course: 72yo with h/o HTN, HLD, asthma, and DM who presented on 6/2 with RUQ pain. RUQ US  with gallbladder distention, CT with asymmetric wall thickening in the antral region of the stomach. Surgery consulted. Given Ceftriaxone.  Underwent lap chole on 6/3 for acute gangrenous cholecystitis.  Assessment and Plan:  Acute gangrenous cholecystitis Presented with 4 to 5 days of progressive RUQ pain with associated SOB RUQ U/S showed biliary sludge and gallbladder wall thickening Patient with RUQ tenderness and positive Murphy sign on exam Met sepsis criteria with tachycardic, tachypnea, lactic acidosis, leukocytosis and evidence of acute cholecystitis General Surgery consulted, cholecystectomy on 6/3 with findings of acute gangrenous cholecystitis Pathology report with marked acute on chronic and focally gangrenous cholecystitis showing mucosal necrosis with cholelithiasis and acute fibrinous serositis Continue Ceftriaxone -> Zosyn -> Augmentin for 5 more days Pain control with PRN oxycodone Blood cultures NTD Follow LFTs to ensure normalization as an outpatient   AKI, resolved Increased creatinine to 1.4 on admission, now back to baseline Likely secondary to mild dehydration in the setting of acute infection and decreased p.o. intake Avoid nephrotoxic agents   Elevated bilirubin Significant rise in bilirubin from 1.7 on 5/31 to 4.9 on admission, improving No intrahepatic ductal dilatation on abdominal imaging Likely secondary to acute cholecystitis LFTs will need to be followed back to normal   Type 2 diabetes A1c 14.9%, very poor control Moderate-scale SSI with CBG monitoring Excellent control while hospitalized resume glipizide , pioglitazone  at time of dc (holding while inpatient) Continue glargine    Constipation Continue scheduled MiraLAX and Senokot-S Emesis x 1 post-Miralax AXR with constipation Improved after enema   HTN BP elevated with SBP in the 130s to 150s Likely worsened with abdominal pain Resume amlodipine , benazepril    HLD Lipid panel 2 months ago showed total cholesterol of 218 and LDL of 156 Resume rosuvastatin    Asymmetric stomach wall thickening CT on 5/31 showed an "asymmetric wall thickening in the antral region in the stomach. Based on sagittal imaging, this may be within normal limits, but mucosal lesion is not excluded. GI follow-up recommended and upper endoscopy may be warranted." Patient has GI follow-up scheduled for 05/13/2024 with Dr. Elvin Hammer   Heart murmur Persistent diastolic murmur Will refer for outpatient cardiology evaluation        Consultants: Surgery   Procedures: Lap chole with intraoperative cholangiogram 6/3   Antibiotics: Ceftriaxone 6/2-  30 Day Unplanned Readmission Risk Score    Flowsheet Row ED to Hosp-Admission (Current) from 04/27/2024 in [facility] GENERAL MED/SURG UNIT  30 Day Unplanned Readmission Risk Score (%) 12.23 Filed at 05/01/2024 1200       This score is the patient's risk of an unplanned readmission within 30 days of being discharged (0 -100%). The score is based on dignosis, age, lab data, medications, orders, and past utilization.   Low:  0-14.9   Medium: 15-21.9   High: 22-29.9   Extreme: 30 and above           Subjective: Still bloated with persistent nausea.  Does not feel well enough to go home.   Objective: Vitals:   05/01/24 0437 05/01/24 0727  BP: (!) 154/92 133/88  Pulse: 83 65  Resp: 16 18  Temp: 100.2 F (37.9 C) 98.7 F (37.1 C)  SpO2: 98% 97%  No intake or output data in the 24 hours ending 05/01/24 1327 Filed Weights   04/27/24 1555 04/28/24 1324  Weight: 82.1 kg 82.1 kg    Exam:  General:  Appears calm and comfortable and is in NAD Eyes:   normal lids, iris ENT:   grossly normal hearing, lips & tongue, mmm Cardiovascular:  RRR, +diastolic murmur. No LE edema.  Respiratory:   CTA bilaterally with no wheezes/rales/rhonchi.  Normal respiratory effort. Abdomen:   distended, diffusely TTP worse in BUQ Skin:  no rash or induration seen on limited exam Musculoskeletal:  grossly normal tone BUE/BLE, good ROM, no bony abnormality Psychiatric:  blunted mood and affect, speech fluent and appropriate, AOx3 Neurologic:  CN 2-12 grossly intact, moves all extremities in coordinated fashion  Data Reviewed: I have reviewed the patient's lab results since admission.  Pertinent labs for today include:   K+ 3.1, repleted Glucose 106 WBC 14, improving     Family Communication: None present  Disposition: Status is: Inpatient Remains inpatient appropriate because: ongoing management     Time spent: 50 minutes   Recommendations at discharge:    Complete antibiotics (Augmentin for 5 more days starting 6/7) Continue Senokot S and Miralax Follow up with Dr. Autry Legions in 1-2 weeks; you will need repeat blood work (CBC, CMP) Follow up with GI (Dr. Manning Seen) on 6/18 as scheduled Follow up with surgery on 7/7 as scheduled Referral made to cardiology re: heart murmur     Unresulted Labs (From admission, onward)     Start     Ordered   05/02/24 0500  CBC with Differential/Platelet  Tomorrow morning,   R        05/01/24 1325   05/02/24 0500  Comprehensive metabolic panel with GFR  Tomorrow morning,   R        05/01/24 1325             Author: Lorita Rosa, Benjamin Patton 05/01/2024 1:27 PM  For on call review [email or web address].

## 2024-05-01 NOTE — Plan of Care (Incomplete)
 Pt alert and oriented x4. Skin warm and dry. Sitting up in bed. No complaints of pain at present. IVFs infusing w/o difficulty. Blood glucose monitored. Pt is ambulatory to the bathroom. enco

## 2024-05-01 NOTE — Progress Notes (Signed)
 3 Days Post-Op   Subjective/Chief Complaint: Pt with BMs afte enema  Tol clears mobilzing   Objective: Vital signs in last 24 hours: Temp:  [98.7 F (37.1 C)-100.2 F (37.9 C)] 98.7 F (37.1 C) (06/06 0727) Pulse Rate:  [65-89] 65 (06/06 0727) Resp:  [16-18] 18 (06/06 0727) BP: (133-154)/(85-92) 133/88 (06/06 0727) SpO2:  [95 %-98 %] 97 % (06/06 0727) Last BM Date : 04/27/24  Intake/Output from previous day: No intake/output data recorded. Intake/Output this shift: No intake/output data recorded.  General appearance: alert and cooperative GI: soft, non-tender; bowel sounds normal; no masses,  no organomegaly and inc c/d/i  Lab Results:  Recent Labs    04/30/24 0505 05/01/24 0540  WBC 16.3* 14.0*  HGB 13.4 13.7  HCT 40.8 41.5  PLT 322 339   BMET Recent Labs    04/30/24 0505 05/01/24 0540  NA 137 139  K 3.3* 3.1*  CL 103 105  CO2 27 24  GLUCOSE 134* 106*  BUN 13 10  CREATININE 0.93 0.85  CALCIUM  8.6* 8.6*   PT/INR No results for input(s): "LABPROT", "INR" in the last 72 hours. ABG No results for input(s): "PHART", "HCO3" in the last 72 hours.  Invalid input(s): "PCO2", "PO2"  Studies/Results: DG ABD ACUTE 2+V W 1V CHEST Result Date: 04/30/2024 CLINICAL DATA:  Postoperative abdominal distention. EXAM: DG ABDOMEN ACUTE WITH 1 VIEW CHEST COMPARISON:  April 27, 2024. FINDINGS: Moderately dilated small bowel loops are noted concerning for distal small bowel obstruction or possibly ileus. No colonic dilatation is noted. Moderate amount of stool seen in the right colon. Phleboliths are noted in the pelvis. Mild right basilar atelectasis is noted. Cardiomediastinal silhouette is unremarkable. IMPRESSION: Mild right basilar atelectasis. Moderately dilated small bowel loops are noted concerning for distal small bowel obstruction or possibly ileus. No colonic dilatation. Moderate stool burden seen in right colon. Electronically Signed   By: Rosalene Colon M.D.   On:  04/30/2024 09:17    Anti-infectives: Anti-infectives (From admission, onward)    Start     Dose/Rate Route Frequency Ordered Stop   04/29/24 0000  amoxicillin-clavulanate (AUGMENTIN) 875-125 MG tablet        1 tablet Oral 2 times daily 04/29/24 0932 05/04/24 2359   04/28/24 2300  cefTRIAXone (ROCEPHIN) 2 g in sodium chloride  0.9 % 100 mL IVPB  Status:  Discontinued        2 g 200 mL/hr over 30 Minutes Intravenous Every 24 hours 04/28/24 0226 04/28/24 1830   04/28/24 1900  piperacillin-tazobactam (ZOSYN) IVPB 3.375 g        3.375 g 12.5 mL/hr over 240 Minutes Intravenous Every 8 hours 04/28/24 1623 05/03/24 2159   04/27/24 2300  cefTRIAXone (ROCEPHIN) 2 g in sodium chloride  0.9 % 100 mL IVPB        2 g 200 mL/hr over 30 Minutes Intravenous  Once 04/27/24 2252 04/27/24 2330       Assessment/Plan: s/p Procedure(s): LAPAROSCOPIC CHOLECYSTECTOMY WITH INTRAOPERATIVE CHOLANGIOGRAM (N/A) Advance diet as tol Mobilize Home in next 1-2d  LOS: 3 days    Shela Derby 05/01/2024

## 2024-05-01 NOTE — Care Management Important Message (Signed)
 Important Message  Patient Details  Name: Benjamin Patton MRN: [id number] Date of Birth: [birth year]   Important Message Given:  Yes - Medicare IM     Wynonia Hedges 05/01/2024, 3:26 PM

## 2024-05-02 DIAGNOSIS — K810 Acute cholecystitis: Secondary | ICD-10-CM | POA: Diagnosis not present

## 2024-05-02 LAB — CULTURE, BLOOD (ROUTINE X 2)
Culture: NO GROWTH
Culture: NO GROWTH

## 2024-05-02 LAB — CBC WITH DIFFERENTIAL/PLATELET
Abs Immature Granulocytes: 0.18 10*3/uL — ABNORMAL HIGH (ref 0.00–0.07)
Basophils Absolute: 0.1 10*3/uL (ref 0.0–0.1)
Basophils Relative: 0 %
Eosinophils Absolute: 0.3 10*3/uL (ref 0.0–0.5)
Eosinophils Relative: 2 %
HCT: 40.4 % (ref 39.0–52.0)
Hemoglobin: 13.5 g/dL (ref 13.0–17.0)
Immature Granulocytes: 1 %
Lymphocytes Relative: 25 %
Lymphs Abs: 3.8 10*3/uL (ref 0.7–4.0)
MCH: 31.9 pg (ref 26.0–34.0)
MCHC: 33.4 g/dL (ref 30.0–36.0)
MCV: 95.5 fL (ref 80.0–100.0)
Monocytes Absolute: 2 10*3/uL — ABNORMAL HIGH (ref 0.1–1.0)
Monocytes Relative: 13 %
Neutro Abs: 9 10*3/uL — ABNORMAL HIGH (ref 1.7–7.7)
Neutrophils Relative %: 59 %
Platelets: 356 10*3/uL (ref 150–400)
RBC: 4.23 MIL/uL (ref 4.22–5.81)
RDW: 13.5 % (ref 11.5–15.5)
WBC: 15.3 10*3/uL — ABNORMAL HIGH (ref 4.0–10.5)
nRBC: 0 % (ref 0.0–0.2)

## 2024-05-02 LAB — COMPREHENSIVE METABOLIC PANEL WITH GFR
ALT: 54 U/L — ABNORMAL HIGH (ref 0–44)
AST: 42 U/L — ABNORMAL HIGH (ref 15–41)
Albumin: 2.4 g/dL — ABNORMAL LOW (ref 3.5–5.0)
Alkaline Phosphatase: 91 U/L (ref 38–126)
Anion gap: 9 (ref 5–15)
BUN: 9 mg/dL (ref 8–23)
CO2: 23 mmol/L (ref 22–32)
Calcium: 8.1 mg/dL — ABNORMAL LOW (ref 8.9–10.3)
Chloride: 103 mmol/L (ref 98–111)
Creatinine, Ser: 0.95 mg/dL (ref 0.61–1.24)
GFR, Estimated: >60 mL/min (ref >60)
Glucose, Bld: 92 mg/dL (ref 70–99)
Potassium: 3.2 mmol/L — ABNORMAL LOW (ref 3.5–5.1)
Sodium: 135 mmol/L (ref 135–145)
Total Bilirubin: 1.2 mg/dL (ref 0.0–1.2)
Total Protein: 6.2 g/dL — ABNORMAL LOW (ref 6.5–8.1)

## 2024-05-02 LAB — GLUCOSE, CAPILLARY
Glucose-Capillary: 108 mg/dL — ABNORMAL HIGH (ref 70–99)
Glucose-Capillary: 221 mg/dL — ABNORMAL HIGH (ref 70–99)
Glucose-Capillary: 89 mg/dL (ref 70–99)
Glucose-Capillary: 91 mg/dL (ref 70–99)

## 2024-05-02 MED ORDER — POTASSIUM CHLORIDE CRYS ER 20 MEQ PO TBCR
40.0000 meq | EXTENDED_RELEASE_TABLET | Freq: Once | ORAL | Status: AC
  Administered 2024-05-02: 40 meq via ORAL
  Filled 2024-05-02: qty 2

## 2024-05-02 NOTE — Progress Notes (Signed)
 Pt resting in the bedside recliner. Stated that the trazadone was a little effective. No acute change in his status.

## 2024-05-02 NOTE — Progress Notes (Signed)
 4 Days Post-Op   Subjective/Chief Complaint: Patient sitting up in chair - minimal abdominal soreness Tolerating clears - diet has not been advanced BM after enema    Objective: Vital signs in last 24 hours: Temp:  [97.8 F (36.6 C)-99.2 F (37.3 C)] 97.9 F (36.6 C) (06/07 0855) Pulse Rate:  [78-98] 98 (06/07 0855) Resp:  [18] 18 (06/07 0855) BP: (127-140)/(85-90) 127/90 (06/07 0855) SpO2:  [96 %-100 %] 100 % (06/07 0855) Last BM Date : 04/27/24  Intake/Output from previous day: 06/06 0701 - 06/07 0700 In: 236 [P.O.:236] Out: -  Intake/Output this shift: No intake/output data recorded.  WDWN in NAD Abd - soft, minimal tenderness Incisions c/d/i  Lab Results:  Recent Labs    05/01/24 0540 05/02/24 0415  WBC 14.0* 15.3*  HGB 13.7 13.5  HCT 41.5 40.4  PLT 339 356   BMET Recent Labs    05/01/24 0540 05/02/24 0415  NA 139 135  K 3.1* 3.2*  CL 105 103  CO2 24 23  GLUCOSE 106* 92  BUN 10 9  CREATININE 0.85 0.95  CALCIUM  8.6* 8.1*      Latest Ref Rng & Units 05/02/2024    4:15 AM 04/30/2024    5:05 AM 04/29/2024    4:45 AM  Hepatic Function  Total Protein 6.5 - 8.1 g/dL 6.2  6.4  7.1   Albumin 3.5 - 5.0 g/dL 2.4  2.3  2.6   AST 15 - 41 U/L 42  74  89   ALT 0 - 44 U/L 54  67  71   Alk Phosphatase 38 - 126 U/L 91  84  90   Total Bilirubin 0.0 - 1.2 mg/dL 1.2  1.1  2.4      Studies/Results: DG ABD ACUTE 2+V W 1V CHEST Result Date: 04/30/2024 CLINICAL DATA:  Postoperative abdominal distention. EXAM: DG ABDOMEN ACUTE WITH 1 VIEW CHEST COMPARISON:  April 27, 2024. FINDINGS: Moderately dilated small bowel loops are noted concerning for distal small bowel obstruction or possibly ileus. No colonic dilatation is noted. Moderate amount of stool seen in the right colon. Phleboliths are noted in the pelvis. Mild right basilar atelectasis is noted. Cardiomediastinal silhouette is unremarkable. IMPRESSION: Mild right basilar atelectasis. Moderately dilated small bowel loops  are noted concerning for distal small bowel obstruction or possibly ileus. No colonic dilatation. Moderate stool burden seen in right colon. Electronically Signed   By: Rosalene Colon M.D.   On: 04/30/2024 09:17    Anti-infectives: Anti-infectives (From admission, onward)    Start     Dose/Rate Route Frequency Ordered Stop   04/29/24 0000  amoxicillin -clavulanate (AUGMENTIN ) 875-125 MG tablet        1 tablet Oral 2 times daily 04/29/24 0932 05/04/24 2359   04/28/24 2300  cefTRIAXone  (ROCEPHIN ) 2 g in sodium chloride  0.9 % 100 mL IVPB  Status:  Discontinued        2 g 200 mL/hr over 30 Minutes Intravenous Every 24 hours 04/28/24 0226 04/28/24 1830   04/28/24 1900  piperacillin -tazobactam (ZOSYN ) IVPB 3.375 g        3.375 g 12.5 mL/hr over 240 Minutes Intravenous Every 8 hours 04/28/24 1623 05/03/24 2159   04/27/24 2300  cefTRIAXone  (ROCEPHIN ) 2 g in sodium chloride  0.9 % 100 mL IVPB        2 g 200 mL/hr over 30 Minutes Intravenous  Once 04/27/24 2252 04/27/24 2330       Assessment/Plan: Acute gangrenous cholecystitis S/p laparoscopic cholecystectomy 04/28/24 -  Melton Squires Advance to heart healthy diet OK for discharge per primary team - abx and PRN pain meds have been prescribed.  Follow-up in chart  LOS: 4 days    Rella Cardinal 05/02/2024

## 2024-05-02 NOTE — Discharge Summary (Signed)
 Physician Discharge Summary   Patient: Benjamin Patton MRN: [id number] DOB: [birth year]  Admit date:     04/27/2024  Discharge date: 05/02/24  Discharge Physician: Lorita Rosa   PCP: [name], MD   Recommendations at discharge:   Advance diet and exercise as tolerated Complete antibiotics (Augmentin  through 6/9) Continue Senokot S and Miralax  Follow up with Dr. Autry Legions in 1-2 weeks; you will need repeat blood work (CBC, CMP) Follow up with GI (Dr. Manning Seen) on 6/18 as scheduled Follow up with surgery on 7/7 as scheduled Referral made to cardiology re: heart murmur  Discharge Diagnoses: Principal Problem:   Acute gangrenous cholecystitis Active Problems:   Sepsis (HCC)   Elevated bilirubin   S/P laparoscopic cholecystectomy    Hospital Course: 72yo with h/o HTN, HLD, asthma, and DM who presented on 6/2 with RUQ pain. RUQ US  with gallbladder distention, CT with asymmetric wall thickening in the antral region of the stomach. Surgery consulted. Given Ceftriaxone .  Underwent lap chole on 6/3 for acute gangrenous cholecystitis.  Assessment and Plan:  Acute gangrenous cholecystitis Presented with 4 to 5 days of progressive RUQ pain with associated SOB RUQ U/S showed biliary sludge and gallbladder wall thickening Patient with RUQ tenderness and positive Murphy sign on exam Met sepsis criteria with tachycardic, tachypnea, lactic acidosis, leukocytosis and evidence of acute cholecystitis General Surgery consulted, cholecystectomy on 6/3 with findings of acute gangrenous cholecystitis Pathology report with marked acute on chronic and focally gangrenous cholecystitis showing mucosal necrosis with cholelithiasis and acute fibrinous serositis Continue Ceftriaxone  -> Zosyn  -> Augmentin  through 6/9 Pain control with PRN oxycodone  Blood cultures NTD Follow LFTs to ensure normalization as an outpatient   AKI, resolved Increased creatinine to 1.4 on admission, now back to  baseline Likely secondary to mild dehydration in the setting of acute infection and decreased p.o. intake Avoid nephrotoxic agents   Elevated bilirubin Significant rise in bilirubin from 1.7 on 5/31 to 4.9 on admission, improving No intrahepatic ductal dilatation on abdominal imaging Likely secondary to acute cholecystitis LFTs will need to be followed back to normal   Type 2 diabetes A1c 14.9%, very poor control Moderate-scale SSI with CBG monitoring Excellent control while hospitalized resume glipizide , pioglitazone  at time of dc (holding while inpatient) Continue glargine   Constipation Continue scheduled MiraLAX  and Senokot-S Emesis x 1 post-Miralax  AXR with constipation Improved after enema   HTN BP elevated with SBP in the 130s to 150s Likely worsened with abdominal pain Resume amlodipine , benazepril    HLD Lipid panel 2 months ago showed total cholesterol of 218 and LDL of 156 Resume rosuvastatin    Asymmetric stomach wall thickening CT on 5/31 showed an "asymmetric wall thickening in the antral region in the stomach. Based on sagittal imaging, this may be within normal limits, but mucosal lesion is not excluded. GI follow-up recommended and upper endoscopy may be warranted." Patient has GI follow-up scheduled for 05/13/2024 with Dr. Elvin Hammer   Heart murmur Persistent diastolic murmur Will refer for outpatient cardiology evaluation        Consultants: Surgery   Procedures: Lap chole with intraoperative cholangiogram 6/3   Antibiotics: Ceftriaxone  x 1 dose Zosyn  6/3-8  Pain control - [place]  Controlled Substance Reporting System database was reviewed. and patient was instructed, not to drive, operate heavy machinery, perform activities at heights, swimming or participation in water activities or provide baby-sitting services while on Pain, Sleep and Anxiety Medications; until their outpatient Physician has advised to do so again. Also recommended to not  to  take more than prescribed Pain, Sleep and Anxiety Medications.   Disposition: Home Diet recommendation:  Regular diet DISCHARGE MEDICATION: Allergies as of 05/02/2024       Reactions   Metformin  And Related Other (See Comments)   Feels bad        Medication List     TAKE these medications    amLODipine -benazepril  10-20 MG capsule Commonly known as: LOTREL Take 1 capsule by mouth daily.   amoxicillin -clavulanate 875-125 MG tablet Commonly known as: AUGMENTIN  Take 1 tablet by mouth 2 (two) times daily for 5 days.   Cholecalciferol  50 MCG (2000 UT) Tabs 1 tab by mouth once daily   famotidine  20 MG tablet Commonly known as: PEPCID  Take 1 tablet (20 mg total) by mouth 2 (two) times daily.   glipiZIDE  10 MG 24 hr tablet Commonly known as: GLUCOTROL  XL Take 1 tablet (10 mg total) by mouth every morning.   HYDROcodone -acetaminophen  5-325 MG tablet Commonly known as: NORCO/VICODIN Take 1-2 tablets by mouth every 6 (six) hours as needed for moderate pain (pain score 4-6).   Lantus  SoloStar 100 UNIT/ML Solostar Pen Generic drug: insulin  glargine Inject 20 Units into the skin daily. E11.9   pantoprazole  20 MG tablet Commonly known as: PROTONIX  Take 1 tablet (20 mg total) by mouth daily.   pioglitazone  45 MG tablet Commonly known as: Actos  Take 1 tablet (45 mg total) by mouth daily.   polyethylene glycol 17 g packet Commonly known as: MIRALAX  / GLYCOLAX  Take 17 g by mouth daily as needed.   rosuvastatin  20 MG tablet Commonly known as: Crestor  Take 1 tablet (20 mg total) by mouth daily.        Follow-up Information     Maczis, Puja Gosai, PA-C. Go on 06/01/2024.   Specialty: General Surgery Why: 1:45 PM, please arrive 30 min prior to appointment time to check in. Contact information: [address] [facility] [place] [address] [phone]                Discharge Exam:   Subjective: Feeling ok. Still some  bloating but pain is improved.  Did not sleep well last night.   Objective: Vitals:   05/02/24 0435 05/02/24 0855  BP: 127/86 (!) 127/90  Pulse: 93 98  Resp: 18 18  Temp: 99.2 F (37.3 C) 97.9 F (36.6 C)  SpO2: 98% 100%    Intake/Output Summary (Last 24 hours) at 05/02/2024 1128 Last data filed at 05/01/2024 1400 Gross per 24 hour  Intake 236 ml  Output --  Net 236 ml   Filed Weights   04/27/24 1555 04/28/24 1324  Weight: 82.1 kg 82.1 kg    Exam:  General:  Appears calm and comfortable and is in NAD Eyes:   normal lids, iris ENT:  grossly normal hearing, lips & tongue, mmm Cardiovascular:  RRR, +diastolic murmur. No LE edema.  Respiratory:   CTA bilaterally with no wheezes/rales/rhonchi.  Normal respiratory effort. Abdomen:   mildly distended, soft, surgical incisions approximated nicely, minimally TTP Skin:  no rash or induration seen on limited exam Musculoskeletal:  grossly normal tone BUE/BLE, good ROM, no bony abnormality Psychiatric:  blunted mood and affect, speech fluent and appropriate, AOx3 Neurologic:  CN 2-12 grossly intact, moves all extremities in coordinated fashion  Data Reviewed: I have reviewed the patient's lab results since admission.  Pertinent labs for today include:   K+ 3.2 Albumin 2.4 AST 42/ALT 54/Bili 1.2, improving WBC 15.3  Condition at discharge: improving  The results of significant diagnostics from this hospitalization (including imaging, microbiology, ancillary and laboratory) are listed below for reference.   Imaging Studies: DG ABD ACUTE 2+V W 1V CHEST Result Date: 04/30/2024 CLINICAL DATA:  Postoperative abdominal distention. EXAM: DG ABDOMEN ACUTE WITH 1 VIEW CHEST COMPARISON:  April 27, 2024. FINDINGS: Moderately dilated small bowel loops are noted concerning for distal small bowel obstruction or possibly ileus. No colonic dilatation is noted. Moderate amount of stool seen in the right colon. Phleboliths are noted in the  pelvis. Mild right basilar atelectasis is noted. Cardiomediastinal silhouette is unremarkable. IMPRESSION: Mild right basilar atelectasis. Moderately dilated small bowel loops are noted concerning for distal small bowel obstruction or possibly ileus. No colonic dilatation. Moderate stool burden seen in right colon. Electronically Signed   By: Rosalene Colon M.D.   On: 04/30/2024 09:17   US  Abdomen Limited RUQ (LIVER/GB) Result Date: 04/27/2024 CLINICAL DATA:  151470 RUQ abdominal pain 151470 EXAM: ULTRASOUND ABDOMEN LIMITED RIGHT UPPER QUADRANT COMPARISON:  04/25/2024. FINDINGS: Gallbladder: Wall thickening. Echogenic sludge. No pericholecystic fluid. No shadowing stones. Common bile duct: Diameter: 0.4 cm. Liver: Liver is hyperechoic consistent with fatty infiltration. No focal hepatic lesions identified. No intrahepatic ductal dilatation. Hepatopetal portal vein flow. IMPRESSION: Gallbladder sludge and wall thickening. Fatty infiltration of the liver. Electronically Signed   By: Sydell Eva M.D.   On: 04/27/2024 19:16   DG Chest 1 View Result Date: 04/27/2024 CLINICAL DATA:  141880 SOB (shortness of breath) 141880 EXAM: CHEST  1 VIEW COMPARISON:  November 08, 2020 FINDINGS: Low lung volumes with streaky bibasilar atelectasis. No lobar consolidation, pleural effusion, or pneumothorax. No cardiomegaly. No acute fracture or destructive lesion. Multilevel thoracic osteophytosis. IMPRESSION: Low lung volumes with streaky bibasilar atelectasis. Electronically Signed   By: Rance Burrows M.D.   On: 04/27/2024 18:14   CT ABDOMEN PELVIS W CONTRAST Result Date: 04/25/2024 CLINICAL DATA:  Nonlocalized abdominal pain. EXAM: CT ABDOMEN AND PELVIS WITH CONTRAST TECHNIQUE: Multidetector CT imaging of the abdomen and pelvis was performed using the standard protocol following bolus administration of intravenous contrast. RADIATION DOSE REDUCTION: This exam was performed according to the departmental dose-optimization  program which includes automated exposure control, adjustment of the mA and/or kV according to patient size and/or use of iterative reconstruction technique. CONTRAST:  75mL OMNIPAQUE  IOHEXOL  350 MG/ML SOLN COMPARISON:  CT stone study 05/16/2022 FINDINGS: Lower chest: No acute findings. Hepatobiliary: No suspicious focal abnormality within the liver parenchyma. There is no evidence for gallstones, gallbladder wall thickening, or pericholecystic fluid. No intrahepatic or extrahepatic biliary dilation. Pancreas: No focal mass lesion. No dilatation of the main duct. No intraparenchymal cyst. No peripancreatic edema. Spleen: No focal abnormality. Adrenals/Urinary Tract: No adrenal nodule or mass. Multiple cysts noted in the kidneys including dominant 5.5 cm left lower pole simple cyst. No followup imaging is recommended. Tiny well-defined homogeneous low-density lesions in both kidneys are too small to characterize but are statistically most likely benign and probably cysts. No followup imaging is recommended. 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 The urinary bladder appears normal for the degree of distention. Stomach/Bowel: Small hiatal hernia. Stomach is distended with gas and fluid. Asymmetric wall thickening noted in the antral region on axial imaging (21/3), potentially related to peristalsis given appearance on sagittal imaging (61/7). Duodenum is normally positioned as is the ligament of Treitz. No small bowel wall thickening. No small bowel dilatation. The  terminal ileum is normal. The appendix is normal. No gross colonic mass. No colonic wall thickening. Vascular/Lymphatic: No abdominal aortic aneurysm. No abdominal aortic atherosclerotic calcification. There is no gastrohepatic or hepatoduodenal ligament lymphadenopathy. No retroperitoneal or mesenteric lymphadenopathy. No pelvic sidewall lymphadenopathy. Reproductive: The  prostate gland and seminal vesicles are unremarkable. Other: No intraperitoneal free fluid. Musculoskeletal: Posttraumatic deformity noted in the superior inferior pubic rami. No worrisome lytic or sclerotic osseous abnormality. IMPRESSION: 1. Asymmetric wall thickening in the antral region the stomach. Based on sagittal imaging, this may be within normal limits, but mucosal lesion is not excluded. GI follow-up recommended and upper endoscopy may be warranted. 2. Otherwise, no acute findings in the abdomen or pelvis. Specifically, no findings to explain the patient's history of abdominal pain. 3. Small hiatal hernia. 4. 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 No hydronephrosis. Electronically Signed   By: Donnal Fusi M.D.   On: 04/25/2024 05:53   US  Abdomen Limited RUQ (LIVER/GB) Result Date: 04/25/2024 CLINICAL DATA:  73 year old male with right upper quadrant abdominal pain. EXAM: ULTRASOUND ABDOMEN LIMITED RIGHT UPPER QUADRANT COMPARISON:  CT Abdomen and Pelvis 05/16/2022. FINDINGS: Gallbladder: The gallbladder appears more distended than on the prior CT with layering sludge (image 3). Wall thickness is at the upper limits of normal, 2-3 mm. No shadowing echogenic stones identified. No sonographic Murphy sign elicited. Common bile duct: Diameter: 5-6 mm, normal. Liver: Liver echogenicity probably within normal limits (increased renal echogenicity suspected on image 39). No discrete liver lesion. Portal vein is patent on color Doppler imaging with normal direction of blood flow towards the liver. Other: Echogenic right renal cortex. Otherwise negative visible right kidney. No free fluid. IMPRESSION: 1. Gallbladder distended with sludge, but no cholelithiasis identified, and no ultrasound evidence of acute cholecystitis. No evidence of bile duct obstruction. 2. Evidence of right kidney chronic medical renal disease. Electronically Signed   By: Marlise Simpers  M.D.   On: 04/25/2024 04:08    Microbiology: Results for orders placed or performed during the hospital encounter of 04/27/24  Blood culture (routine x 2)     Status: None   Collection Time: 04/27/24 10:11 PM   Specimen: BLOOD  Result Value Ref Range Status   Specimen Description BLOOD SITE NOT SPECIFIED  Final   Special Requests   Final    BOTTLES DRAWN AEROBIC AND ANAEROBIC Blood Culture results may not be optimal due to an inadequate volume of blood received in culture bottles   Culture   Final    NO GROWTH 5 DAYS Performed at [facility], [address], [place], [place] [address]    Report Status 05/02/2024 FINAL  Final  Blood culture (routine x 2)     Status: None   Collection Time: 04/27/24 10:16 PM   Specimen: BLOOD  Result Value Ref Range Status   Specimen Description BLOOD SITE NOT SPECIFIED  Final   Special Requests   Final    BOTTLES DRAWN AEROBIC AND ANAEROBIC Blood Culture results may not be optimal due to an inadequate volume of blood received in culture bottles   Culture   Final    NO GROWTH 5 DAYS Performed at [facility], [address] [place], [place] [address]    Report Status 05/02/2024 FINAL  Final    Labs: CBC: Recent Labs  Lab 04/28/24 0536 04/29/24 0445 04/30/24 0505 05/01/24 0540 05/02/24 0415  WBC 19.6* 19.0* 16.3* 14.0* 15.3*  NEUTROABS  --   --  10.8* 8.5* 9.0*  HGB 14.5 13.8 13.4 13.7 13.5  HCT 42.8 42.6 40.8 41.5 40.4  MCV 96.2 98.4 97.1 95.6 95.5  PLT 257 312 322 339 356   Basic Metabolic Panel: Recent Labs  Lab 04/28/24 0536 04/29/24 0445 04/30/24 0505 05/01/24 0540 05/02/24 0415  NA 135 137 137 139 135  K 3.5 4.4 3.3* 3.1* 3.2*  CL 103 99 103 105 103  CO2 23 25 27 24 23   GLUCOSE 169* 175* 134* 106* 92  BUN 19 17 13 10 9   CREATININE 1.15 1.06 0.93 0.85 0.95  CALCIUM  8.5* 9.0 8.6* 8.6* 8.1*   Liver Function Tests: Recent Labs  Lab 04/27/24 1635 04/28/24 0536 04/29/24 0445 04/30/24 0505 05/02/24 0415   AST 35 39 89* 74* 42*  ALT 30 35 71* 67* 54*  ALKPHOS 87 86 90 84 91  BILITOT 4.9* 3.3* 2.4* 1.1 1.2  PROT 8.2* 7.4 7.1 6.4* 6.2*  ALBUMIN 3.4* 2.9* 2.6* 2.3* 2.4*   CBG: Recent Labs  Lab 05/01/24 1703 05/01/24 2029 05/02/24 0003 05/02/24 0433 05/02/24 0851  GLUCAP 173* 176* 89 91 108*    Discharge time spent: greater than 30 minutes.  Signed: Lorita Rosa, MD [facility] 05/02/2024

## 2024-05-02 NOTE — Progress Notes (Signed)
 Pt remains in the bedside recliner. Has rested at intervals. Blood glucose wnl. No complaints of pain. Encouraged to call for assistance. Call light in reach.

## 2024-05-04 ENCOUNTER — Telehealth: Payer: Self-pay

## 2024-05-04 NOTE — Transitions of Care (Post Inpatient/ED Visit) (Signed)
   05/04/2024  Name: Benjamin Patton MRN: [id number] DOB: [birth year]  Today's TOC FU Call Status: Today's TOC FU Call Status:: Unsuccessful Call (1st Attempt) Unsuccessful Call (1st Attempt) Date: 05/04/24  Attempted to reach the patient regarding the most recent Inpatient/ED visit.  Follow Up Plan: Additional outreach attempts will be made to reach the patient to complete the Transitions of Care (Post Inpatient/ED visit) call.   Tonia Frankel RN, CCM [place]  VBCI-Population Health RN Care Manager [phone]

## 2024-05-05 ENCOUNTER — Telehealth: Payer: Self-pay

## 2024-05-05 ENCOUNTER — Encounter: Payer: Self-pay | Admitting: *Deleted

## 2024-05-05 NOTE — Transitions of Care (Post Inpatient/ED Visit) (Signed)
   05/05/2024  Name: TATSUYA OKRAY MRN: [id number] DOB: 1951/02/15  Today's TOC FU Call Status: Today's TOC FU Call Status:: Unsuccessful Call (2nd Attempt) Unsuccessful Call (2nd Attempt) Date: 05/05/24  Attempted to reach the patient regarding the most recent Inpatient/ED visit.  Follow Up Plan: Additional outreach attempts will be made to reach the patient to complete the Transitions of Care (Post Inpatient/ED visit) call.   Tonia Frankel RN, CCM [place]  VBCI-Population Health RN Care Manager [phone]

## 2024-05-05 NOTE — Telephone Encounter (Signed)
 This encounter was created in error - please disregard.

## 2024-05-06 ENCOUNTER — Telehealth: Payer: Self-pay

## 2024-05-06 NOTE — Transitions of Care (Post Inpatient/ED Visit) (Signed)
   05/06/2024  Name: Benjamin Patton MRN: [id number] DOB: [birth year]  Today's TOC FU Call Status: Today's TOC FU Call Status:: Unsuccessful Call (3rd Attempt) Unsuccessful Call (3rd Attempt) Date: 05/06/24  Attempted to reach the patient regarding the most recent Inpatient/ED visit.  Follow Up Plan: No further outreach attempts will be made at this time. We have been unable to contact the patient.  Tonia Frankel RN, CCM [place]  VBCI-Population Health RN Care Manager [phone]

## 2024-05-13 ENCOUNTER — Encounter: Admitting: Internal Medicine

## 2024-05-15 ENCOUNTER — Ambulatory Visit: Payer: Medicare Other | Admitting: Internal Medicine

## 2024-05-18 ENCOUNTER — Other Ambulatory Visit: Payer: Self-pay | Admitting: Internal Medicine

## 2024-05-18 ENCOUNTER — Other Ambulatory Visit (INDEPENDENT_AMBULATORY_CARE_PROVIDER_SITE_OTHER)

## 2024-05-18 ENCOUNTER — Ambulatory Visit: Payer: Self-pay | Admitting: Internal Medicine

## 2024-05-18 ENCOUNTER — Inpatient Hospital Stay: Admitting: Internal Medicine

## 2024-05-18 DIAGNOSIS — E1165 Type 2 diabetes mellitus with hyperglycemia: Secondary | ICD-10-CM | POA: Diagnosis not present

## 2024-05-18 DIAGNOSIS — Z125 Encounter for screening for malignant neoplasm of prostate: Secondary | ICD-10-CM

## 2024-05-18 DIAGNOSIS — E7800 Pure hypercholesterolemia, unspecified: Secondary | ICD-10-CM

## 2024-05-18 DIAGNOSIS — E559 Vitamin D deficiency, unspecified: Secondary | ICD-10-CM

## 2024-05-18 DIAGNOSIS — E538 Deficiency of other specified B group vitamins: Secondary | ICD-10-CM

## 2024-05-18 LAB — LIPID PANEL
Cholesterol: 133 mg/dL (ref 0–200)
HDL: 30.6 mg/dL — ABNORMAL LOW (ref >39.00)
LDL Cholesterol: 89 mg/dL (ref 0–99)
NonHDL: 102.51
Total CHOL/HDL Ratio: 4
Triglycerides: 68 mg/dL (ref 0.0–149.0)
VLDL: 13.6 mg/dL (ref 0.0–40.0)

## 2024-05-18 LAB — CBC WITH DIFFERENTIAL/PLATELET
Basophils Absolute: 0.1 10*3/uL (ref 0.0–0.1)
Basophils Relative: 0.6 % (ref 0.0–3.0)
Eosinophils Absolute: 0.1 10*3/uL (ref 0.0–0.7)
Eosinophils Relative: 0.4 % (ref 0.0–5.0)
HCT: 38.4 % — ABNORMAL LOW (ref 39.0–52.0)
Hemoglobin: 12.9 g/dL — ABNORMAL LOW (ref 13.0–17.0)
Lymphocytes Relative: 20.9 % (ref 12.0–46.0)
Lymphs Abs: 3 10*3/uL (ref 0.7–4.0)
MCHC: 33.6 g/dL (ref 30.0–36.0)
MCV: 94.2 fl (ref 78.0–100.0)
Monocytes Absolute: 1.9 10*3/uL — ABNORMAL HIGH (ref 0.1–1.0)
Monocytes Relative: 13.4 % — ABNORMAL HIGH (ref 3.0–12.0)
Neutro Abs: 9.5 10*3/uL — ABNORMAL HIGH (ref 1.4–7.7)
Neutrophils Relative %: 64.7 % (ref 43.0–77.0)
Platelets: 524 10*3/uL — ABNORMAL HIGH (ref 150.0–400.0)
RBC: 4.08 Mil/uL — ABNORMAL LOW (ref 4.22–5.81)
RDW: 13.2 % (ref 11.5–15.5)
WBC: 14.6 10*3/uL — ABNORMAL HIGH (ref 4.0–10.5)

## 2024-05-18 LAB — HEPATIC FUNCTION PANEL
ALT: 70 U/L — ABNORMAL HIGH (ref 0–53)
AST: 73 U/L — ABNORMAL HIGH (ref 0–37)
Albumin: 3.8 g/dL (ref 3.5–5.2)
Alkaline Phosphatase: 226 U/L — ABNORMAL HIGH (ref 39–117)
Bilirubin, Direct: 0.7 mg/dL — ABNORMAL HIGH (ref 0.0–0.3)
Total Bilirubin: 2 mg/dL — ABNORMAL HIGH (ref 0.2–1.2)
Total Protein: 7.7 g/dL (ref 6.0–8.3)

## 2024-05-18 LAB — TSH: TSH: 0.98 u[IU]/mL (ref 0.35–5.50)

## 2024-05-18 LAB — VITAMIN B12: Vitamin B-12: 623 pg/mL (ref 211–911)

## 2024-05-18 LAB — BASIC METABOLIC PANEL WITH GFR
BUN: 14 mg/dL (ref 6–23)
CO2: 24 meq/L (ref 19–32)
Calcium: 9.7 mg/dL (ref 8.4–10.5)
Chloride: 99 meq/L (ref 96–112)
Creatinine, Ser: 1.14 mg/dL (ref 0.40–1.50)
GFR: 64.67 mL/min (ref >60.00)
Glucose, Bld: 149 mg/dL — ABNORMAL HIGH (ref 70–99)
Potassium: 4.6 meq/L (ref 3.5–5.1)
Sodium: 136 meq/L (ref 135–145)

## 2024-05-18 LAB — PSA: PSA: 2.09 ng/mL (ref 0.10–4.00)

## 2024-05-18 LAB — HEMOGLOBIN A1C: Hgb A1c MFr Bld: 8.2 % — ABNORMAL HIGH (ref 4.6–6.5)

## 2024-05-18 LAB — VITAMIN D 25 HYDROXY (VIT D DEFICIENCY, FRACTURES): VITD: 15.85 ng/mL — ABNORMAL LOW (ref 30.00–100.00)

## 2024-05-18 MED ORDER — LANTUS SOLOSTAR 100 UNIT/ML ~~LOC~~ SOPN: 30.0000 [IU] | PEN_INJECTOR | Freq: Every day | SUBCUTANEOUS | 11 refills | Status: DC

## 2024-05-18 NOTE — Progress Notes (Deleted)
  Cardiology Office Note   Date:  05/18/2024  ID:  Benjamin Patton, Benjamin Patton 09-16-1951, MRN [id number] PCP: Norleen Lynwood LELON, MD  [facility] Providers Cardiologist:  None    History of Present Illness Benjamin Patton is a 73 y.o. male with a past medical history of HTN, HLD, diabetes, coronary calcium  score of 0. He presents today to establish care for evaluation of diastolic heart murmur  Patient previously had an echocardiogram completed in 2016 that showed EF 55-60%, no regional wall motion abnormalities, grade 1 DD, trace MR and TR.  Later had CT calcium  scoring in 02/2023 that showed a coronary calcium  score of 0.  He was recently admitted in 04/2024 with acute gangrenous cholecystitis.  Underwent cholecystectomy on 6/3.  Admission complicated by AKI which had resolved prior to discharge.  A1c 14.9%.  On exam, patient was found to have a persistent diastolic heart murmur and was referred to cardiology as an outpatient  Diastolic heart murmur  Type 2 diabetes - A1c 14.9% during recent admission   ROS: ***  Studies Reviewed      *** Risk Assessment/Calculations {Does this patient have ATRIAL FIBRILLATION?:[id number]} No BP recorded.  {Refresh Note OR Click here to enter BP  :1}***       Physical Exam VS:  There were no vitals taken for this visit.       Wt Readings from Last 3 Encounters:  04/28/24 181 lb (82.1 kg)  04/25/24 185 lb (83.9 kg)  02/26/24 177 lb (80.3 kg)    GEN: Well nourished, well developed in no acute distress NECK: No JVD; No carotid bruits CARDIAC: ***RRR, no murmurs, rubs, gallops RESPIRATORY:  Clear to auscultation without rales, wheezing or rhonchi  ABDOMEN: Soft, non-tender, non-distended EXTREMITIES:  No edema; No deformity   ASSESSMENT AND PLAN ***    {Are you ordering a CV Procedure (e.g. stress test, cath, DCCV, TEE, etc)?   Press F2        :[id number]}  Dispo: ***  Signed, Rollo FABIENE Louder, PA-C

## 2024-05-19 ENCOUNTER — Encounter: Payer: Self-pay | Admitting: Emergency Medicine

## 2024-05-19 ENCOUNTER — Ambulatory Visit (INDEPENDENT_AMBULATORY_CARE_PROVIDER_SITE_OTHER): Admitting: Emergency Medicine

## 2024-05-19 ENCOUNTER — Ambulatory Visit (INDEPENDENT_AMBULATORY_CARE_PROVIDER_SITE_OTHER)

## 2024-05-19 VITALS — BP 102/64 | HR 102 | Temp 100.5°F | Ht 70.5 in | Wt 171.0 lb

## 2024-05-19 DIAGNOSIS — R509 Fever, unspecified: Secondary | ICD-10-CM | POA: Insufficient documentation

## 2024-05-19 DIAGNOSIS — K810 Acute cholecystitis: Secondary | ICD-10-CM

## 2024-05-19 DIAGNOSIS — M545 Low back pain, unspecified: Secondary | ICD-10-CM

## 2024-05-19 DIAGNOSIS — D72829 Elevated white blood cell count, unspecified: Secondary | ICD-10-CM | POA: Diagnosis not present

## 2024-05-19 DIAGNOSIS — I10 Essential (primary) hypertension: Secondary | ICD-10-CM | POA: Diagnosis not present

## 2024-05-19 DIAGNOSIS — Z794 Long term (current) use of insulin: Secondary | ICD-10-CM | POA: Diagnosis not present

## 2024-05-19 DIAGNOSIS — M5136 Other intervertebral disc degeneration, lumbar region with discogenic back pain only: Secondary | ICD-10-CM | POA: Diagnosis not present

## 2024-05-19 DIAGNOSIS — J9811 Atelectasis: Secondary | ICD-10-CM | POA: Diagnosis not present

## 2024-05-19 DIAGNOSIS — Z09 Encounter for follow-up examination after completed treatment for conditions other than malignant neoplasm: Secondary | ICD-10-CM

## 2024-05-19 DIAGNOSIS — Z9049 Acquired absence of other specified parts of digestive tract: Secondary | ICD-10-CM | POA: Diagnosis not present

## 2024-05-19 DIAGNOSIS — R5082 Postprocedural fever: Secondary | ICD-10-CM | POA: Diagnosis not present

## 2024-05-19 DIAGNOSIS — E1165 Type 2 diabetes mellitus with hyperglycemia: Secondary | ICD-10-CM | POA: Diagnosis not present

## 2024-05-19 LAB — URINALYSIS, ROUTINE W REFLEX MICROSCOPIC
Bilirubin Urine: NEGATIVE
Hgb urine dipstick: NEGATIVE
Ketones, ur: NEGATIVE
Leukocytes,Ua: NEGATIVE
Nitrite: NEGATIVE
Specific Gravity, Urine: 1.02 (ref 1.000–1.030)
Urine Glucose: NEGATIVE
Urobilinogen, UA: 0.2 (ref 0.0–1.0)
pH: 6 (ref 5.0–8.0)

## 2024-05-19 LAB — MICROALBUMIN / CREATININE URINE RATIO
Creatinine,U: 218.8 mg/dL
Microalb Creat Ratio: 10 mg/g (ref 0.0–30.0)
Microalb, Ur: 2.2 mg/dL — ABNORMAL HIGH (ref 0.0–1.9)

## 2024-05-19 MED ORDER — CYCLOBENZAPRINE HCL 10 MG PO TABS: 10.0000 mg | ORAL_TABLET | Freq: Every day | ORAL | 0 refills | Status: DC

## 2024-05-19 MED ORDER — HYDROCODONE-ACETAMINOPHEN 5-325 MG PO TABS: 1.0000 | ORAL_TABLET | Freq: Four times a day (QID) | ORAL | 0 refills | Status: DC | PRN
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
## 2024-05-19 NOTE — Assessment & Plan Note (Signed)
 Lab Results  Component Value Date   HGBA1C 8.2 (H) 05/18/2024  Much improved hemoglobin A1c Continue present management as directed by Dr. Norleen Patient on insulin  30 units daily, Actos  45 mg daily and glipizide  10 mg daily

## 2024-05-19 NOTE — Assessment & Plan Note (Signed)
 BP Readings from Last 3 Encounters:  05/19/24 102/64  05/02/24 (!) 127/90  04/27/24 138/84  Well-controlled hypertension Continue amlodipine  benazepril  10-20 mg daily

## 2024-05-19 NOTE — Assessment & Plan Note (Signed)
 Persistent leukocytosis despite surgery for cholecystitis We will check urine today for UTI Chest x-ray does not show pneumonia Consider abscess formation

## 2024-05-19 NOTE — Assessment & Plan Note (Signed)
 Status post cholecystectomy Successful surgery Benign abdominal examination Still has leukocytosis

## 2024-05-19 NOTE — Assessment & Plan Note (Signed)
 White blood cell count 14.6 Lumbar pain seems to be musculoskeletal. No risk factor for paraspinal abscess Question intra-abdominal abscess

## 2024-05-19 NOTE — Patient Instructions (Signed)
 Acute Back Pain, Adult Acute back pain is sudden and usually short-lived. It is often caused by an injury to the muscles and tissues in the back. The injury may result from: A muscle, tendon, or ligament getting overstretched or torn. Ligaments are tissues that connect bones to each other. Lifting something improperly can cause a back strain. Wear and tear (degeneration) of the spinal disks. Spinal disks are circular tissue that provide cushioning between the bones of the spine (vertebrae). Twisting motions, such as while playing sports or doing yard work. A hit to the back. Arthritis. You may have a physical exam, lab tests, and imaging tests to find the cause of your pain. Acute back pain usually goes away with rest and home care. Follow these instructions at home: Managing pain, stiffness, and swelling Take over-the-counter and prescription medicines only as told by your health care provider. Treatment may include medicines for pain and inflammation that are taken by mouth or applied to the skin, or muscle relaxants. Your health care provider may recommend applying ice during the first 24-48 hours after your pain starts. To do this: Put ice in a plastic bag. Place a towel between your skin and the bag. Leave the ice on for 20 minutes, 2-3 times a day. Remove the ice if your skin turns bright red. This is very important. If you cannot feel pain, heat, or cold, you have a greater risk of damage to the area. If directed, apply heat to the affected area as often as told by your health care provider. Use the heat source that your health care provider recommends, such as a moist heat pack or a heating pad. Place a towel between your skin and the heat source. Leave the heat on for 20-30 minutes. Remove the heat if your skin turns bright red. This is especially important if you are unable to feel pain, heat, or cold. You have a greater risk of getting burned. Activity  Do not stay in bed. Staying in  bed for more than 1-2 days can delay your recovery. Sit up and stand up straight. Avoid leaning forward when you sit or hunching over when you stand. If you work at a desk, sit close to it so you do not need to lean over. Keep your chin tucked in. Keep your neck drawn back, and keep your elbows bent at a 90-degree angle (right angle). Sit high and close to the steering wheel when you drive. Add lower back (lumbar) support to your car seat, if needed. Take short walks on even surfaces as soon as you are able. Try to increase the length of time you walk each day. Do not sit, drive, or stand in one place for more than 30 minutes at a time. Sitting or standing for long periods of time can put stress on your back. Do not drive or use heavy machinery while taking prescription pain medicine. Use proper lifting techniques. When you bend and lift, use positions that put less stress on your back: [place] your knees. Keep the load close to your body. Avoid twisting. Exercise regularly as told by your health care provider. Exercising helps your back heal faster and helps prevent back injuries by keeping muscles strong and flexible. Work with a physical therapist to make a safe exercise program, as recommended by your health care provider. Do any exercises as told by your physical therapist. Lifestyle Maintain a healthy weight. Extra weight puts stress on your back and makes it difficult to have good  posture. Avoid activities or situations that make you feel anxious or stressed. Stress and anxiety increase muscle tension and can make back pain worse. Learn ways to manage anxiety and stress, such as through exercise. General instructions Sleep on a firm mattress in a comfortable position. Try lying on your side with your knees slightly bent. If you lie on your back, put a pillow under your knees. Keep your head and neck in a straight line with your spine (neutral position) when using electronic equipment like  smartphones or pads. To do this: Raise your smartphone or pad to look at it instead of bending your head or neck to look down. Put the smartphone or pad at the level of your face while looking at the screen. Follow your treatment plan as told by your health care provider. This may include: Cognitive or behavioral therapy. Acupuncture or massage therapy. Meditation or yoga. Contact a health care provider if: You have pain that is not relieved with rest or medicine. You have increasing pain going down into your legs or buttocks. Your pain does not improve after 2 weeks. You have pain at night. You lose weight without trying. You have a fever or chills. You develop nausea or vomiting. You develop abdominal pain. Get help right away if: You develop new bowel or bladder control problems. You have unusual weakness or numbness in your arms or legs. You feel faint. These symptoms may represent a serious problem that is an emergency. Do not wait to see if the symptoms will go away. Get medical help right away. Call your local emergency services (911 in the [place] Do not drive yourself to the hospital. Summary Acute back pain is sudden and usually short-lived. Use proper lifting techniques. When you bend and lift, use positions that put less stress on your back. Take over-the-counter and prescription medicines only as told by your health care provider, and apply heat or ice as told. This information is not intended to replace advice given to you by your health care provider. Make sure you discuss any questions you have with your health care provider. Document Revised: 02/03/2021 Document Reviewed: 02/03/2021 Elsevier Patient Education  2024 [employer].

## 2024-05-19 NOTE — Assessment & Plan Note (Signed)
 Lumbar spine x-rays show significant degenerative disc disease of  lumbar spine Spasm and tenderness on examination Without paraspinal abscess Recommend Flexeril  at bedtime Pain management discussed Tylenol  for mild to moderate pain and Norco for moderate to severe pain Needs close follow-up with PCP

## 2024-05-20 LAB — URINALYSIS, ROUTINE W REFLEX MICROSCOPIC
Bilirubin Urine: NEGATIVE
Ketones, ur: NEGATIVE
Leukocytes,Ua: NEGATIVE
Nitrite: NEGATIVE
RBC / HPF: NONE SEEN (ref 0–?)
Specific Gravity, Urine: 1.025 (ref 1.000–1.030)
Total Protein, Urine: 30 — AB
Urine Glucose: NEGATIVE
Urobilinogen, UA: 1 (ref 0.0–1.0)
pH: 6 (ref 5.0–8.0)

## 2024-05-20 LAB — URINE CULTURE: Result:: NO GROWTH

## 2024-05-21 ENCOUNTER — Encounter: Payer: Self-pay | Admitting: Internal Medicine

## 2024-05-21 ENCOUNTER — Ambulatory Visit (INDEPENDENT_AMBULATORY_CARE_PROVIDER_SITE_OTHER): Admitting: Internal Medicine

## 2024-05-21 VITALS — BP 116/82 | HR 106 | Temp 99.1°F | Ht 70.5 in | Wt 171.0 lb

## 2024-05-21 DIAGNOSIS — R1011 Right upper quadrant pain: Secondary | ICD-10-CM

## 2024-05-21 DIAGNOSIS — I10 Essential (primary) hypertension: Secondary | ICD-10-CM | POA: Diagnosis not present

## 2024-05-21 DIAGNOSIS — E1165 Type 2 diabetes mellitus with hyperglycemia: Secondary | ICD-10-CM | POA: Diagnosis not present

## 2024-05-21 DIAGNOSIS — Z794 Long term (current) use of insulin: Secondary | ICD-10-CM | POA: Diagnosis not present

## 2024-05-21 DIAGNOSIS — E559 Vitamin D deficiency, unspecified: Secondary | ICD-10-CM

## 2024-05-21 DIAGNOSIS — J9811 Atelectasis: Secondary | ICD-10-CM | POA: Diagnosis not present
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
## 2024-05-21 NOTE — Patient Instructions (Addendum)
 Due to persistent illness with feverish, elevated white blood cells, and persistent pain especially to the right upper abdominal area (and the underinflated lung on that side), we need to make sure there is no abscess or other complication.     You will be contacted regarding the referral for: CT scan - urgent  Depending on the results, you may need another antibiotic, and also to see the General [facility] sooner than July 7  Please continue all other medications as before, including the hydrocodone   Please have the pharmacy call with any other refills you may need.  Please keep your appointments with your specialists as you may have planned - Dr Abran  Please make an Appointment to return in 1-2 wks or as needed if you get better anyway

## 2024-05-22 ENCOUNTER — Encounter (HOSPITAL_BASED_OUTPATIENT_CLINIC_OR_DEPARTMENT_OTHER): Payer: Self-pay

## 2024-05-22 ENCOUNTER — Other Ambulatory Visit: Payer: Self-pay

## 2024-05-22 ENCOUNTER — Inpatient Hospital Stay (HOSPITAL_BASED_OUTPATIENT_CLINIC_OR_DEPARTMENT_OTHER)
Admission: EM | Admit: 2024-05-22 | Discharge: 2024-05-26 | DRG: 862 | Disposition: A | Discharge status: Home / Self Care | Attending: Family Medicine | Admitting: Family Medicine

## 2024-05-22 ENCOUNTER — Ambulatory Visit (HOSPITAL_BASED_OUTPATIENT_CLINIC_OR_DEPARTMENT_OTHER)
Admission: RE | Admit: 2024-05-22 | Discharge: 2024-05-22 | Disposition: A | Discharge status: Home / Self Care | Source: Ambulatory Visit | Attending: Internal Medicine | Admitting: Internal Medicine

## 2024-05-22 ENCOUNTER — Emergency Department (HOSPITAL_BASED_OUTPATIENT_CLINIC_OR_DEPARTMENT_OTHER)

## 2024-05-22 ENCOUNTER — Ambulatory Visit: Payer: Self-pay | Admitting: Internal Medicine

## 2024-05-22 DIAGNOSIS — R0602 Shortness of breath: Secondary | ICD-10-CM | POA: Diagnosis not present

## 2024-05-22 DIAGNOSIS — R011 Cardiac murmur, unspecified: Secondary | ICD-10-CM | POA: Diagnosis not present

## 2024-05-22 DIAGNOSIS — Y836 Removal of other organ (partial) (total) as the cause of abnormal reaction of the patient, or of later complication, without mention of misadventure at the time of the procedure: Secondary | ICD-10-CM | POA: Diagnosis present

## 2024-05-22 DIAGNOSIS — Z9081 Acquired absence of spleen: Secondary | ICD-10-CM

## 2024-05-22 DIAGNOSIS — K750 Abscess of liver: Secondary | ICD-10-CM | POA: Diagnosis not present

## 2024-05-22 DIAGNOSIS — E7800 Pure hypercholesterolemia, unspecified: Secondary | ICD-10-CM | POA: Diagnosis not present

## 2024-05-22 DIAGNOSIS — J45909 Unspecified asthma, uncomplicated: Secondary | ICD-10-CM | POA: Diagnosis present

## 2024-05-22 DIAGNOSIS — R7401 Elevation of levels of liver transaminase levels: Secondary | ICD-10-CM | POA: Diagnosis present

## 2024-05-22 DIAGNOSIS — E1165 Type 2 diabetes mellitus with hyperglycemia: Secondary | ICD-10-CM

## 2024-05-22 DIAGNOSIS — I10 Essential (primary) hypertension: Secondary | ICD-10-CM | POA: Diagnosis present

## 2024-05-22 DIAGNOSIS — K630 Abscess of intestine: Secondary | ICD-10-CM | POA: Diagnosis not present

## 2024-05-22 DIAGNOSIS — Z7984 Long term (current) use of oral hypoglycemic drugs: Secondary | ICD-10-CM | POA: Diagnosis not present

## 2024-05-22 DIAGNOSIS — R1011 Right upper quadrant pain: Secondary | ICD-10-CM | POA: Insufficient documentation

## 2024-05-22 DIAGNOSIS — N281 Cyst of kidney, acquired: Secondary | ICD-10-CM | POA: Diagnosis not present

## 2024-05-22 DIAGNOSIS — I771 Stricture of artery: Secondary | ICD-10-CM | POA: Diagnosis not present

## 2024-05-22 DIAGNOSIS — Z0389 Encounter for observation for other suspected diseases and conditions ruled out: Secondary | ICD-10-CM | POA: Diagnosis not present

## 2024-05-22 DIAGNOSIS — R0989 Other specified symptoms and signs involving the circulatory and respiratory systems: Secondary | ICD-10-CM | POA: Diagnosis not present

## 2024-05-22 DIAGNOSIS — Q630 Accessory kidney: Secondary | ICD-10-CM | POA: Diagnosis not present

## 2024-05-22 DIAGNOSIS — T8143XA Infection following a procedure, organ and space surgical site, initial encounter: Principal | ICD-10-CM | POA: Diagnosis present

## 2024-05-22 DIAGNOSIS — Z9049 Acquired absence of other specified parts of digestive tract: Secondary | ICD-10-CM

## 2024-05-22 DIAGNOSIS — E119 Type 2 diabetes mellitus without complications: Secondary | ICD-10-CM | POA: Diagnosis present

## 2024-05-22 DIAGNOSIS — K651 Peritoneal abscess: Principal | ICD-10-CM | POA: Diagnosis present

## 2024-05-22 DIAGNOSIS — Z794 Long term (current) use of insulin: Secondary | ICD-10-CM

## 2024-05-22 LAB — COMPREHENSIVE METABOLIC PANEL WITH GFR
ALT: 127 U/L — ABNORMAL HIGH (ref 0–44)
AST: 110 U/L — ABNORMAL HIGH (ref 15–41)
Albumin: 3.6 g/dL (ref 3.5–5.0)
Alkaline Phosphatase: 279 U/L — ABNORMAL HIGH (ref 38–126)
Anion gap: 14 (ref 5–15)
BUN: 18 mg/dL (ref 8–23)
CO2: 24 mmol/L (ref 22–32)
Calcium: 10.1 mg/dL (ref 8.9–10.3)
Chloride: 101 mmol/L (ref 98–111)
Creatinine, Ser: 1.13 mg/dL (ref 0.61–1.24)
GFR, Estimated: >60 mL/min (ref >60)
Glucose, Bld: 154 mg/dL — ABNORMAL HIGH (ref 70–99)
Potassium: 3.9 mmol/L (ref 3.5–5.1)
Sodium: 138 mmol/L (ref 135–145)
Total Bilirubin: 0.6 mg/dL (ref 0.0–1.2)
Total Protein: 8.5 g/dL — ABNORMAL HIGH (ref 6.5–8.1)

## 2024-05-22 LAB — CBC WITH DIFFERENTIAL/PLATELET
Abs Immature Granulocytes: 0.07 10*3/uL (ref 0.00–0.07)
Basophils Absolute: 0 10*3/uL (ref 0.0–0.1)
Basophils Relative: 0 %
Eosinophils Absolute: 0.2 10*3/uL (ref 0.0–0.5)
Eosinophils Relative: 2 %
HCT: 37.4 % — ABNORMAL LOW (ref 39.0–52.0)
Hemoglobin: 12.4 g/dL — ABNORMAL LOW (ref 13.0–17.0)
Immature Granulocytes: 1 %
Lymphocytes Relative: 17 %
Lymphs Abs: 2.6 10*3/uL (ref 0.7–4.0)
MCH: 31.5 pg (ref 26.0–34.0)
MCHC: 33.2 g/dL (ref 30.0–36.0)
MCV: 94.9 fL (ref 80.0–100.0)
Monocytes Absolute: 2.2 10*3/uL — ABNORMAL HIGH (ref 0.1–1.0)
Monocytes Relative: 15 %
Neutro Abs: 10 10*3/uL — ABNORMAL HIGH (ref 1.7–7.7)
Neutrophils Relative %: 65 %
Platelets: 521 10*3/uL — ABNORMAL HIGH (ref 150–400)
RBC: 3.94 MIL/uL — ABNORMAL LOW (ref 4.22–5.81)
RDW: 13.2 % (ref 11.5–15.5)
WBC: 15.2 10*3/uL — ABNORMAL HIGH (ref 4.0–10.5)
nRBC: 0 % (ref 0.0–0.2)

## 2024-05-22 LAB — LACTIC ACID, PLASMA
Lactic Acid, Venous: 1.2 mmol/L (ref 0.5–1.9)
Lactic Acid, Venous: 1.6 mmol/L (ref 0.5–1.9)

## 2024-05-22 LAB — PROTIME-INR
INR: 1.2 (ref 0.8–1.2)
Prothrombin Time: 16.2 s — ABNORMAL HIGH (ref 11.4–15.2)

## 2024-05-22 MED ORDER — PIPERACILLIN-TAZOBACTAM 3.375 G IVPB 30 MIN
3.3750 g | Freq: Once | INTRAVENOUS | Status: AC
  Administered 2024-05-22: 3.375 g via INTRAVENOUS
  Filled 2024-05-22: qty 50

## 2024-05-22 MED ORDER — LACTATED RINGERS IV BOLUS
1000.0000 mL | Freq: Once | INTRAVENOUS | Status: AC
  Administered 2024-05-22: 1000 mL via INTRAVENOUS

## 2024-05-22 MED ORDER — FENTANYL CITRATE PF 50 MCG/ML IJ SOSY: 25.0000 ug | PREFILLED_SYRINGE | INTRAMUSCULAR | Status: DC | PRN

## 2024-05-22 MED ORDER — IOHEXOL 300 MG/ML  SOLN
100.0000 mL | Freq: Once | INTRAMUSCULAR | Status: AC | PRN
  Administered 2024-05-22: 100 mL via INTRAVENOUS

## 2024-05-22 NOTE — ED Triage Notes (Signed)
 Pt sent by surgical team for CT results- intra-abdominal abscess from recent chole (6/3). Advises 8/10 abd pain, low-grade fevers (tmax 100), night sweats, fatigue. Sent for admission/ IV abx.

## 2024-05-22 NOTE — Progress Notes (Signed)
 ED Pharmacy Antibiotic Sign Off An antibiotic consult was received from an ED provider for zosyn  per pharmacy dosing for intra-abdominal infection. A chart review was completed to assess appropriateness.   The following one time order(s) were placed:  Zosyn  3.375g  Further antibiotic and/or antibiotic pharmacy consults should be ordered by the admitting provider if indicated.   Thank you for allowing pharmacy to be a part of this patient's care.   Leonor GORMAN Bash, [facility]  Clinical Pharmacist 05/22/24 7:59 PM

## 2024-05-22 NOTE — ED Provider Notes (Signed)
 [place] EMERGENCY DEPARTMENT AT [facility] Provider Note   CSN: [id number] Arrival date & time: 05/22/24  1737     Patient presents with: Abdominal Pain and Abscess (Intra-abdominal)   Benjamin Patton is a 73 y.o. male.  Patient past history significant for diabetes, recent cholecystectomy presents to the emergency department with concerns of abdominal pain and concern for abdominal abscess.  Patient reportedly had CT imaging performed today by surgical team who noted that patient has an abdominal abscess present at the postoperative site of his cholecystectomy.  Has reported abdominal pain primarily to the right upper quadrant rated 8 out of 10.  Endorsing low-grade fevers with max temperature at about 100 agrees [employer].  Also endorsing night sweats and fatigue.  He was advised by surgery to come in for IV antibiotics and admission.   Abdominal Pain Abscess      Prior to Admission medications   Medication Sig Start Date End Date Taking? Authorizing Provider  amLODipine -benazepril  (LOTREL) 10-20 MG capsule Take 1 capsule by mouth daily.    [provider]  Cholecalciferol  50 MCG (2000 UT) TABS 1 tab by mouth once daily 01/09/22   Norleen Lynwood LELON, MD  cyclobenzaprine  (FLEXERIL ) 10 MG tablet Take 1 tablet (10 mg total) by mouth at bedtime. 05/19/24   Sagardia, Miguel Jose, MD  famotidine  (PEPCID ) 20 MG tablet Take 1 tablet (20 mg total) by mouth 2 (two) times daily. 04/25/24   [name], [name], PA-C  glipiZIDE  (GLUCOTROL  XL) 10 MG 24 hr tablet Take 1 tablet (10 mg total) by mouth every morning. 02/26/24   Norleen Lynwood LELON, MD  HYDROcodone -acetaminophen  (NORCO/VICODIN) 5-325 MG tablet Take 1-2 tablets by mouth every 6 (six) hours as needed for moderate pain (pain score 4-6). 04/29/24   Vicci Burnard SAUNDERS, PA-C  HYDROcodone -acetaminophen  (NORCO/VICODIN) 5-325 MG tablet Take 1 tablet by mouth every 6 (six) hours as needed for moderate pain (pain score 4-6). 05/19/24   Purcell Emil Schanz, MD  insulin  glargine (LANTUS  SOLOSTAR) 100 UNIT/ML Solostar Pen Inject 30 Units into the skin daily. E11.9 05/18/24   Norleen Lynwood LELON, MD  pantoprazole  (PROTONIX ) 20 MG tablet Take 1 tablet (20 mg total) by mouth daily. 04/25/24   [name], [name], PA-C  pioglitazone  (ACTOS ) 45 MG tablet Take 1 tablet (45 mg total) by mouth daily. 02/26/24   Norleen Lynwood LELON, MD  polyethylene glycol (MIRALAX  / GLYCOLAX ) 17 g packet Take 17 g by mouth daily as needed. 04/29/24   Vicci Burnard SAUNDERS, PA-C  rosuvastatin  (CRESTOR ) 20 MG tablet Take 1 tablet (20 mg total) by mouth daily. 02/26/24   Norleen Lynwood LELON, MD    Allergies: Metformin  and related    Review of Systems  Gastrointestinal:  Positive for abdominal pain.  All other systems reviewed and are negative.   Updated Vital Signs BP 123/83   Pulse (!) 106   Temp 98.5 F (36.9 C) (Oral)   Resp 16   SpO2 96%   Physical Exam Vitals and nursing note reviewed.  Constitutional:      General: He is not in acute distress.    Appearance: He is well-developed. He is not ill-appearing.  HENT:     Head: Normocephalic and atraumatic.   Eyes:     Conjunctiva/sclera: Conjunctivae normal.    Cardiovascular:     Rate and Rhythm: Normal rate and regular rhythm.     Heart sounds: No murmur heard. Pulmonary:     Effort: Pulmonary effort is normal. No  respiratory distress.     Breath sounds: Normal breath sounds.  Abdominal:     Palpations: Abdomen is soft.     Tenderness: There is abdominal tenderness in the right upper quadrant. There is guarding.   Musculoskeletal:        General: No swelling.     Cervical back: Neck supple.   Skin:    General: Skin is warm and dry.     Capillary Refill: Capillary refill takes less than 2 seconds.   Neurological:     Mental Status: He is alert.   Psychiatric:        Mood and Affect: Mood normal.    (all labs ordered are listed, but only abnormal results are displayed) Labs Reviewed  CBC WITH  DIFFERENTIAL/PLATELET - Abnormal; Notable for the following components:      Result Value   WBC 15.2 (*)    RBC 3.94 (*)    Hemoglobin 12.4 (*)    HCT 37.4 (*)    Platelets 521 (*)    Neutro Abs 10.0 (*)    Monocytes Absolute 2.2 (*)    All other components within normal limits  PROTIME-INR - Abnormal; Notable for the following components:   Prothrombin Time 16.2 (*)    All other components within normal limits  CULTURE, BLOOD (ROUTINE X 2)  CULTURE, BLOOD (ROUTINE X 2)  LACTIC ACID, PLASMA  LACTIC ACID, PLASMA  COMPREHENSIVE METABOLIC PANEL WITH GFR    EKG: None  Radiology: [facility] Chest Port 1 View Result Date: 05/22/2024 CLINICAL DATA:  Questionable sepsis - evaluate for abnormality EXAM: PORTABLE CHEST - 1 VIEW COMPARISON:  May 19, 2024 FINDINGS: Low lung volumes with streaky atelectasis or scarring in the right lung base. No focal airspace consolidation, pleural effusion, or pneumothorax. No cardiomegaly. Tortuous aorta. No acute fracture or destructive lesion. Multilevel thoracic osteophytosis. IMPRESSION: No acute cardiopulmonary abnormality. Electronically Signed   By: Rogelia Myers M.D.   On: 05/22/2024 18:52   CT ABDOMEN PELVIS W CONTRAST Result Date: 05/22/2024 CLINICAL DATA:  Abdominal pain, acute, nonlocalized. EXAM: CT ABDOMEN AND PELVIS WITH CONTRAST TECHNIQUE: Multidetector CT imaging of the abdomen and pelvis was performed using the standard protocol following bolus administration of intravenous contrast. RADIATION DOSE REDUCTION: This exam was performed according to the departmental dose-optimization program which includes automated exposure control, adjustment of the mA and/or kV according to patient size and/or use of iterative reconstruction technique. CONTRAST:  OMNIPAQUE  IOHEXOL  300 MG/ML  SOLN COMPARISON:  CT scan abdomen pelvis from 04/25/2024. FINDINGS: Lower chest: There are patchy atelectatic changes in the visualized lung bases. No overt consolidation. No  pleural effusion. The heart is normal in size. No pericardial effusion. Hepatobiliary: The liver is normal in size. Non-cirrhotic configuration. No suspicious mass. No intrahepatic or extrahepatic bile duct dilation. As per the electronic medical record, patient underwent cholecystectomy on 04/28/2024. In the cholecystectomy bed, there is a thick irregular multi septated walled-off collection measuring 6.4 x 7.3 x 6.4 cm (anteroposterior x transverse x craniocaudal), when measured including the walls. The collection contains moderate amount of air and multiple air-fluid levels. There is mild-to-moderate surrounding fat stranding. Findings are compatible with abscess/complex collection in the cholecystectomy bed. There is loss of fat planes and reactive thickening of the anti mesenteric wall of the hepatic flexure of colon. Pancreas: Unremarkable. No pancreatic ductal dilatation or surrounding inflammatory changes. Spleen: Surgically absent. Accessory splenules noted in the left upper quadrant, posteriorly. Adrenals/Urinary Tract: Adrenal glands are unremarkable. No suspicious renal  mass. Redemonstration of multiple simple cysts in bilateral kidneys with largest arising from the left kidney lower pole, posteriorly measuring 5.4 x 5.8 cm. No nephroureterolithiasis or obstructive uropathy on either side. 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 Urinary bladder is under distended, precluding optimal assessment. However, no large mass or stones identified. No perivesical fat stranding. Stomach/Bowel: No disproportionate dilation of the small or large bowel loops. The appendix is unremarkable. There is asymmetric thickening of the anti mesenteric wall of the hepatic flexure of colon due to proximity with cholecystectomy bed abscess/collection. Vascular/Lymphatic: No ascites or pneumoperitoneum. No abdominal or pelvic lymphadenopathy, by size criteria. No aneurysmal dilation of the major  abdominal arteries. Reproductive: Enlarged prostate. Symmetric seminal vesicles. Other: Periumbilical surgical scar noted. There is also probable prior right-sided orchiectomy versus hernia repair. Correlate with surgical history. The soft tissues and abdominal wall are otherwise unremarkable. Musculoskeletal: No suspicious osseous lesions. There are mild multilevel degenerative changes in the visualized spine. Old healed fracture deformity of lower sacrum noted. There is also old healed deformity of bilateral superior/inferior pubic rami and pubic symphysis. IMPRESSION: 1. There is a 6.4 x 7.3 x 6.4 cm abscess/complex collection in the cholecystectomy bed. There is loss of fat planes and reactive thickening of the anti mesenteric wall of the hepatic flexure of colon. 2. Multiple other nonacute observations, as described above. Electronically Signed   By: Ree Molt M.D.   On: 05/22/2024 14:14     Procedures   Medications Ordered in the ED  lactated ringers  bolus 1,000 mL (has no administration in time range)                                    Medical Decision Making Amount and/or Complexity of Data Reviewed Labs: ordered. Radiology: ordered.  Risk Prescription drug management. Decision regarding hospitalization.    This patient presents to the ED for concern of abdominal pain, abscess, this involves an extensive number of treatment options, and is a complaint that carries with it a high risk of complications and morbidity.  The differential diagnosis includes intra-abdominal abscess, cholangitis, bowel obstruction, sepsis   Co morbidities that complicate the patient evaluation  Recent cholecystectomy, diabetes   Lab Tests:  I Ordered, and personally interpreted labs.  The pertinent results include: CBC shows leukocytosis 15.2, CMP with transaminitis with AST, ALT, and alkaline phosphatase elevations,   Imaging Studies ordered:  I ordered imaging studies including CT  abdomen pelvis performed earlier today, chest x-ray I independently visualized and interpreted imaging which showed CT abdomen pelvis with concerns of a 6.4 x 7.3 x 6.4 cm abscess/complex collection in the cholecystectomy bed.  There is loss of fat planes and reactive thickening of the antimesenteric wall of the hepatic flexure of colon.  Multiple other nonacute ulcerations as described above.  Chest x-ray shows no acute cardiopulmonary process. I agree with the radiologist interpretation   Cardiac Monitoring: / EKG:  The patient was maintained on a cardiac monitor.  I personally viewed and interpreted the cardiac monitored which showed an underlying rhythm of: sinus rhythm   Consultations Obtained:  I requested consultation with the general surgery, hospitalist,  and discussed lab and imaging findings as well as pertinent plan - they recommend: Dr. Ann, general surgery, advised medicine admission and likely IR drainage with general surgery consulting.    Problem List / ED Course / Critical interventions / Medication  management  *** I ordered medication including ***  for ***  Reevaluation of the patient after these medicines showed that the patient {resolved/improved/worsened:[id number]::improved} I have reviewed the patients home medicines and have made adjustments as needed   Social Determinants of Health:  ***   Test / Admission - Considered:  ***  Final diagnoses:  None    ED Discharge Orders     None

## 2024-05-23 ENCOUNTER — Encounter (HOSPITAL_COMMUNITY): Payer: Self-pay | Admitting: Family Medicine

## 2024-05-23 ENCOUNTER — Inpatient Hospital Stay (HOSPITAL_COMMUNITY)

## 2024-05-23 DIAGNOSIS — Y836 Removal of other organ (partial) (total) as the cause of abnormal reaction of the patient, or of later complication, without mention of misadventure at the time of the procedure: Secondary | ICD-10-CM | POA: Diagnosis present

## 2024-05-23 DIAGNOSIS — Z9081 Acquired absence of spleen: Secondary | ICD-10-CM | POA: Diagnosis not present

## 2024-05-23 DIAGNOSIS — Z9049 Acquired absence of other specified parts of digestive tract: Secondary | ICD-10-CM | POA: Diagnosis not present

## 2024-05-23 DIAGNOSIS — J45909 Unspecified asthma, uncomplicated: Secondary | ICD-10-CM | POA: Diagnosis present

## 2024-05-23 DIAGNOSIS — R011 Cardiac murmur, unspecified: Secondary | ICD-10-CM | POA: Diagnosis not present

## 2024-05-23 DIAGNOSIS — Z794 Long term (current) use of insulin: Secondary | ICD-10-CM | POA: Diagnosis not present

## 2024-05-23 DIAGNOSIS — K838 Other specified diseases of biliary tract: Secondary | ICD-10-CM | POA: Diagnosis not present

## 2024-05-23 DIAGNOSIS — T8143XA Infection following a procedure, organ and space surgical site, initial encounter: Secondary | ICD-10-CM | POA: Diagnosis not present

## 2024-05-23 DIAGNOSIS — R7401 Elevation of levels of liver transaminase levels: Secondary | ICD-10-CM | POA: Diagnosis present

## 2024-05-23 DIAGNOSIS — E7800 Pure hypercholesterolemia, unspecified: Secondary | ICD-10-CM | POA: Diagnosis present

## 2024-05-23 DIAGNOSIS — Z7984 Long term (current) use of oral hypoglycemic drugs: Secondary | ICD-10-CM | POA: Diagnosis not present

## 2024-05-23 DIAGNOSIS — E119 Type 2 diabetes mellitus without complications: Secondary | ICD-10-CM | POA: Diagnosis present

## 2024-05-23 DIAGNOSIS — R0602 Shortness of breath: Secondary | ICD-10-CM | POA: Diagnosis not present

## 2024-05-23 DIAGNOSIS — I10 Essential (primary) hypertension: Secondary | ICD-10-CM | POA: Diagnosis present

## 2024-05-23 DIAGNOSIS — K651 Peritoneal abscess: Secondary | ICD-10-CM | POA: Diagnosis not present

## 2024-05-23 DIAGNOSIS — J9811 Atelectasis: Secondary | ICD-10-CM | POA: Diagnosis not present

## 2024-05-23 LAB — COMPREHENSIVE METABOLIC PANEL WITH GFR
ALT: 87 U/L — ABNORMAL HIGH (ref 0–44)
AST: 82 U/L — ABNORMAL HIGH (ref 15–41)
Albumin: 2.4 g/dL — ABNORMAL LOW (ref 3.5–5.0)
Alkaline Phosphatase: 182 U/L — ABNORMAL HIGH (ref 38–126)
Anion gap: 13 (ref 5–15)
BUN: 11 mg/dL (ref 8–23)
CO2: 22 mmol/L (ref 22–32)
Calcium: 8.9 mg/dL (ref 8.9–10.3)
Chloride: 104 mmol/L (ref 98–111)
Creatinine, Ser: 0.98 mg/dL (ref 0.61–1.24)
GFR, Estimated: >60 mL/min (ref >60)
Glucose, Bld: 100 mg/dL — ABNORMAL HIGH (ref 70–99)
Potassium: 3.4 mmol/L — ABNORMAL LOW (ref 3.5–5.1)
Sodium: 139 mmol/L (ref 135–145)
Total Bilirubin: 1.2 mg/dL (ref 0.0–1.2)
Total Protein: 7.2 g/dL (ref 6.5–8.1)

## 2024-05-23 LAB — GLUCOSE, CAPILLARY
Glucose-Capillary: 101 mg/dL — ABNORMAL HIGH (ref 70–99)
Glucose-Capillary: 110 mg/dL — ABNORMAL HIGH (ref 70–99)
Glucose-Capillary: 113 mg/dL — ABNORMAL HIGH (ref 70–99)
Glucose-Capillary: 115 mg/dL — ABNORMAL HIGH (ref 70–99)
Glucose-Capillary: 88 mg/dL (ref 70–99)
Glucose-Capillary: 99 mg/dL (ref 70–99)

## 2024-05-23 LAB — CBC
HCT: 35.5 % — ABNORMAL LOW (ref 39.0–52.0)
Hemoglobin: 11.7 g/dL — ABNORMAL LOW (ref 13.0–17.0)
MCH: 31.7 pg (ref 26.0–34.0)
MCHC: 33 g/dL (ref 30.0–36.0)
MCV: 96.2 fL (ref 80.0–100.0)
Platelets: 502 10*3/uL — ABNORMAL HIGH (ref 150–400)
RBC: 3.69 MIL/uL — ABNORMAL LOW (ref 4.22–5.81)
RDW: 13 % (ref 11.5–15.5)
WBC: 14.7 10*3/uL — ABNORMAL HIGH (ref 4.0–10.5)
nRBC: 0 % (ref 0.0–0.2)

## 2024-05-23 MED ORDER — ONDANSETRON HCL 4 MG/2ML IJ SOLN: 4.0000 mg | Freq: Four times a day (QID) | INTRAMUSCULAR | Status: DC | PRN

## 2024-05-23 MED ORDER — OXYCODONE HCL 5 MG PO TABS
10.0000 mg | ORAL_TABLET | ORAL | Status: DC | PRN
  Administered 2024-05-23: 10 mg via ORAL
  Filled 2024-05-23: qty 2

## 2024-05-23 MED ORDER — NALOXONE HCL 0.4 MG/ML IJ SOLN
INTRAMUSCULAR | Status: AC
  Filled 2024-05-23: qty 1

## 2024-05-23 MED ORDER — MIDAZOLAM HCL 2 MG/2ML IJ SOLN
INTRAMUSCULAR | Status: AC | PRN
  Administered 2024-05-23 (×2): .5 mg via INTRAVENOUS
  Administered 2024-05-23: 1 mg via INTRAVENOUS

## 2024-05-23 MED ORDER — SODIUM CHLORIDE 0.9% FLUSH
5.0000 mL | Freq: Three times a day (TID) | INTRAVENOUS | Status: DC
  Administered 2024-05-23 – 2024-05-26 (×8): 5 mL

## 2024-05-23 MED ORDER — SENNOSIDES-DOCUSATE SODIUM 8.6-50 MG PO TABS
1.0000 | ORAL_TABLET | Freq: Every evening | ORAL | Status: DC | PRN
Start: 2024-05-23 — End: 2024-05-26

## 2024-05-23 MED ORDER — OXYCODONE HCL 5 MG PO TABS: 5.0000 mg | ORAL_TABLET | ORAL | Status: DC | PRN

## 2024-05-23 MED ORDER — PANTOPRAZOLE SODIUM 20 MG PO TBEC
20.0000 mg | DELAYED_RELEASE_TABLET | Freq: Every day | ORAL | Status: DC
  Administered 2024-05-23 – 2024-05-26 (×4): 20 mg via ORAL
  Filled 2024-05-23 (×4): qty 1

## 2024-05-23 MED ORDER — OXYCODONE HCL 5 MG PO TABS
5.0000 mg | ORAL_TABLET | Freq: Four times a day (QID) | ORAL | Status: DC | PRN
  Administered 2024-05-23 (×2): 5 mg via ORAL
  Filled 2024-05-23 (×2): qty 1

## 2024-05-23 MED ORDER — MORPHINE SULFATE (PF) 2 MG/ML IV SOLN: 2.0000 mg | INTRAVENOUS | Status: DC | PRN

## 2024-05-23 MED ORDER — LIDOCAINE HCL 1 % IJ SOLN
INTRAMUSCULAR | Status: AC
Start: 2024-05-23 — End: 2024-05-23
  Filled 2024-05-23: qty 10

## 2024-05-23 MED ORDER — MIDAZOLAM HCL 2 MG/2ML IJ SOLN
INTRAMUSCULAR | Status: AC
Start: 2024-05-23 — End: 2024-05-23
  Filled 2024-05-23: qty 4

## 2024-05-23 MED ORDER — FLUMAZENIL 0.5 MG/5ML IV SOLN
INTRAVENOUS | Status: AC
Start: 2024-05-23 — End: 2024-05-23
  Filled 2024-05-23: qty 5

## 2024-05-23 MED ORDER — ACETAMINOPHEN 325 MG PO TABS: 325.0000 mg | ORAL_TABLET | Freq: Four times a day (QID) | ORAL | Status: DC | PRN

## 2024-05-23 MED ORDER — INSULIN GLARGINE-YFGN 100 UNIT/ML ~~LOC~~ SOLN
10.0000 [IU] | Freq: Every day | SUBCUTANEOUS | Status: DC
  Administered 2024-05-24 – 2024-05-26 (×3): 10 [IU] via SUBCUTANEOUS
  Filled 2024-05-23 (×4): qty 0.1

## 2024-05-23 MED ORDER — BENAZEPRIL HCL 20 MG PO TABS
20.0000 mg | ORAL_TABLET | Freq: Every day | ORAL | Status: DC
  Administered 2024-05-23 – 2024-05-26 (×4): 20 mg via ORAL
  Filled 2024-05-23 (×4): qty 1

## 2024-05-23 MED ORDER — INSULIN ASPART 100 UNIT/ML IJ SOLN: 0.0000 [IU] | INTRAMUSCULAR | Status: DC

## 2024-05-23 MED ORDER — ONDANSETRON HCL 4 MG PO TABS: 4.0000 mg | ORAL_TABLET | Freq: Four times a day (QID) | ORAL | Status: DC | PRN

## 2024-05-23 MED ORDER — LACTATED RINGERS IV SOLN: INTRAVENOUS | Status: DC

## 2024-05-23 MED ORDER — FENTANYL CITRATE (PF) 100 MCG/2ML IJ SOLN
INTRAMUSCULAR | Status: AC | PRN
  Administered 2024-05-23: 50 ug via INTRAVENOUS
  Administered 2024-05-23 (×2): 25 ug via INTRAVENOUS

## 2024-05-23 MED ORDER — PIPERACILLIN-TAZOBACTAM 3.375 G IVPB
3.3750 g | Freq: Three times a day (TID) | INTRAVENOUS | Status: DC
  Administered 2024-05-23 – 2024-05-26 (×10): 3.375 g via INTRAVENOUS
  Filled 2024-05-23 (×10): qty 50

## 2024-05-23 MED ORDER — FENTANYL CITRATE (PF) 100 MCG/2ML IJ SOLN
INTRAMUSCULAR | Status: AC
  Filled 2024-05-23: qty 4

## 2024-05-23 MED ORDER — AMLODIPINE BESYLATE 10 MG PO TABS
10.0000 mg | ORAL_TABLET | Freq: Every day | ORAL | Status: DC
  Administered 2024-05-23 – 2024-05-26 (×4): 10 mg via ORAL
  Filled 2024-05-23 (×4): qty 1

## 2024-05-23 MED ORDER — SODIUM CHLORIDE 0.9% FLUSH
3.0000 mL | Freq: Two times a day (BID) | INTRAVENOUS | Status: DC
  Administered 2024-05-23 – 2024-05-26 (×4): 3 mL via INTRAVENOUS

## 2024-05-23 MED ORDER — IOHEXOL 350 MG/ML SOLN
75.0000 mL | Freq: Once | INTRAVENOUS | Status: AC | PRN
  Administered 2024-05-23: 75 mL via INTRAVENOUS

## 2024-05-23 NOTE — Plan of Care (Signed)

## 2024-05-23 NOTE — ED Notes (Signed)
 Report called to 6N receiving RN, [facility] called, pt ready for transfer.

## 2024-05-23 NOTE — Progress Notes (Signed)
 Assessment & Plan: Abscess in gallbladder fossa - status post cholecystectomy 04/28/2024 by Dr. Rubin  - admitted to medical service  - IV Zosyn  started  - NPO, IVF  Will contact IR for percutaneous drainage procedure.  Discussed with patient and his wife at bedside.        Benjamin Spinner, MD [facility] practice Office: [phone]        Chief Complaint: Abdominal pain, fever after cholecystectomy  Subjective: Patient in bed, mild pain.  Wife at bedside.  Objective: Vital signs in last 24 hours: Temp:  [98.4 F (36.9 C)-99.9 F (37.7 C)] 98.4 F (36.9 C) (06/28 0818) Pulse Rate:  [82-106] 82 (06/28 0818) Resp:  [16-24] 18 (06/28 0818) BP: (112-131)/(74-83) 112/75 (06/28 0818) SpO2:  [96 %-100 %] 99 % (06/28 0818) Weight:  [77.6 kg] 77.6 kg (06/28 0400) Last BM Date : 05/21/24  Intake/Output from previous day: 06/27 0701 - 06/28 0700 In: 1179.6 [P.O.:120; I.V.:2.9; IV Piggyback:1056.7] Out: -  Intake/Output this shift: No intake/output data recorded.  Physical Exam: HEENT - sclerae clear, mucous membranes moist Neck - soft Abdomen - soft without distension; mild tenderness RUQ; no mass; wounds dry and intact Ext - no edema, non-tender  Lab Results:  Recent Labs    05/22/24 1905 05/23/24 0856  WBC 15.2* 14.7*  HGB 12.4* 11.7*  HCT 37.4* 35.5*  PLT 521* 502*   BMET Recent Labs    05/22/24 1905  NA 138  K 3.9  CL 101  CO2 24  GLUCOSE 154*  BUN 18  CREATININE 1.13  CALCIUM  10.1   PT/INR Recent Labs    05/22/24 1905  LABPROT 16.2*  INR 1.2   Comprehensive Metabolic Panel:    Component Value Date/Time   NA 138 05/22/2024 1905   NA 136 05/18/2024 1351   K 3.9 05/22/2024 1905   K 4.6 05/18/2024 1351   CL 101 05/22/2024 1905   CL 99 05/18/2024 1351   CO2 24 05/22/2024 1905   CO2 24 05/18/2024 1351   BUN 18 05/22/2024 1905   BUN 14 05/18/2024 1351   CREATININE 1.13 05/22/2024 1905   CREATININE 1.14  05/18/2024 1351   CREATININE 0.94 04/07/2015 1015   CREATININE 0.82 09/05/2013 1050   GLUCOSE 154 (H) 05/22/2024 1905   GLUCOSE 149 (H) 05/18/2024 1351   CALCIUM  10.1 05/22/2024 1905   CALCIUM  9.7 05/18/2024 1351   AST 110 (H) 05/22/2024 1905   AST 73 (H) 05/18/2024 1351   ALT 127 (H) 05/22/2024 1905   ALT 70 (H) 05/18/2024 1351   ALKPHOS 279 (H) 05/22/2024 1905   ALKPHOS 226 (H) 05/18/2024 1351   BILITOT 0.6 05/22/2024 1905   BILITOT 2.0 (H) 05/18/2024 1351   PROT 8.5 (H) 05/22/2024 1905   PROT 7.7 05/18/2024 1351   ALBUMIN 3.6 05/22/2024 1905   ALBUMIN 3.8 05/18/2024 1351    Studies/Results: DG Chest Port 1 View Result Date: 05/22/2024 CLINICAL DATA:  Questionable sepsis - evaluate for abnormality EXAM: PORTABLE CHEST - 1 VIEW COMPARISON:  May 19, 2024 FINDINGS: Low lung volumes with streaky atelectasis or scarring in the right lung base. No focal airspace consolidation, pleural effusion, or pneumothorax. No cardiomegaly. Tortuous aorta. No acute fracture or destructive lesion. Multilevel thoracic osteophytosis. IMPRESSION: No acute cardiopulmonary abnormality. Electronically Signed   By: Rogelia Myers M.D.   On: 05/22/2024 18:52   CT ABDOMEN PELVIS W CONTRAST Result Date: 05/22/2024 CLINICAL DATA:  Abdominal pain, acute, nonlocalized. EXAM: CT ABDOMEN  AND PELVIS WITH CONTRAST TECHNIQUE: Multidetector CT imaging of the abdomen and pelvis was performed using the standard protocol following bolus administration of intravenous contrast. RADIATION DOSE REDUCTION: This exam was performed according to the departmental dose-optimization program which includes automated exposure control, adjustment of the mA and/or kV according to patient size and/or use of iterative reconstruction technique. CONTRAST:  OMNIPAQUE  IOHEXOL  300 MG/ML  SOLN COMPARISON:  CT scan abdomen pelvis from 04/25/2024. FINDINGS: Lower chest: There are patchy atelectatic changes in the visualized lung bases. No overt  consolidation. No pleural effusion. The heart is normal in size. No pericardial effusion. Hepatobiliary: The liver is normal in size. Non-cirrhotic configuration. No suspicious mass. No intrahepatic or extrahepatic bile duct dilation. As per the electronic medical record, patient underwent cholecystectomy on 04/28/2024. In the cholecystectomy bed, there is a thick irregular multi septated walled-off collection measuring 6.4 x 7.3 x 6.4 cm (anteroposterior x transverse x craniocaudal), when measured including the walls. The collection contains moderate amount of air and multiple air-fluid levels. There is mild-to-moderate surrounding fat stranding. Findings are compatible with abscess/complex collection in the cholecystectomy bed. There is loss of fat planes and reactive thickening of the anti mesenteric wall of the hepatic flexure of colon. Pancreas: Unremarkable. No pancreatic ductal dilatation or surrounding inflammatory changes. Spleen: Surgically absent. Accessory splenules noted in the left upper quadrant, posteriorly. Adrenals/Urinary Tract: Adrenal glands are unremarkable. No suspicious renal mass. Redemonstration of multiple simple cysts in bilateral kidneys with largest arising from the left kidney lower pole, posteriorly measuring 5.4 x 5.8 cm. No nephroureterolithiasis or obstructive uropathy on either side. 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 Urinary bladder is under distended, precluding optimal assessment. However, no large mass or stones identified. No perivesical fat stranding. Stomach/Bowel: No disproportionate dilation of the small or large bowel loops. The appendix is unremarkable. There is asymmetric thickening of the anti mesenteric wall of the hepatic flexure of colon due to proximity with cholecystectomy bed abscess/collection. Vascular/Lymphatic: No ascites or pneumoperitoneum. No abdominal or pelvic lymphadenopathy, by size criteria. No aneurysmal  dilation of the major abdominal arteries. Reproductive: Enlarged prostate. Symmetric seminal vesicles. Other: Periumbilical surgical scar noted. There is also probable prior right-sided orchiectomy versus hernia repair. Correlate with surgical history. The soft tissues and abdominal wall are otherwise unremarkable. Musculoskeletal: No suspicious osseous lesions. There are mild multilevel degenerative changes in the visualized spine. Old healed fracture deformity of lower sacrum noted. There is also old healed deformity of bilateral superior/inferior pubic rami and pubic symphysis. IMPRESSION: 1. There is a 6.4 x 7.3 x 6.4 cm abscess/complex collection in the cholecystectomy bed. There is loss of fat planes and reactive thickening of the anti mesenteric wall of the hepatic flexure of colon. 2. Multiple other nonacute observations, as described above. Electronically Signed   By: Ree Molt M.D.   On: 05/22/2024 14:14      Benjamin Patton 05/23/2024  Patient ID: Benjamin Patton, male   DOB: May 16, 1951, 73 y.o.   MRN: [id number]

## 2024-05-23 NOTE — Procedures (Signed)
 Vascular and Interventional Radiology Procedure Note  Patient: Benjamin Patton DOB: 1950-11-30 Medical Record Number: [id number] Note Date/Time: 05/23/24 11:53 AM   Performing Physician: Thom Hall, MD Assistant(s): None  Diagnosis: Hx cholecystectomy. GB fossa abscess  Procedure: GALLBLADDER FOSSA ABSCESS DRAIN PLACEMENT   Anesthesia: Conscious Sedation Complications: None Estimated Blood Loss: Minimal Specimens: Sent for Gram Stain, Aerobe Culture, and Anerobe Culture  Findings:  Successful CT-guided placement of 10 F catheter into GB fossa abscess.  Plan:  - Flush drain with 5 mL Normal Saline every 8 hours. - Follow up drain evaluation / sinogram in 7 day(s).  See detailed procedure note with images in PACS. The patient tolerated the procedure well without incident or complication and was returned to Recovery in stable condition.    Thom Hall, MD [facility]   Pager. [phone] Clinic. [phone]
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
## 2024-05-23 NOTE — ED Notes (Signed)
 Informed pt that he has a bed pending at [facility] cone following clean room and handoff complete, pt had no further questions or concerns at this time.
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
## 2024-05-23 NOTE — Procedures (Signed)
 CT guided drain catheter placed by Dr. Hughes to RT side fluid collection.  44fr Skater drain with bulb suction system left in place.  Approximately 10ml of purulent fluid sent to pathology per orders.   Pt received 2ml of Versed  and 100mcg of Fentanyl  for 10mn of sedation.

## 2024-05-23 NOTE — Plan of Care (Signed)
  Problem: Pain Managment: Goal: General experience of comfort will improve and/or be controlled Outcome: Progressing   Problem: Safety: Goal: Ability to remain free from injury will improve Outcome: Progressing

## 2024-05-23 NOTE — Progress Notes (Signed)
 PROGRESS NOTE    [name]  FMW:[id number] DOB: 1951/04/16 DOA: 05/22/2024 PCP: Benjamin Lynwood LELON, MD  Chief Complaint  Patient presents with   Abdominal Pain   Abscess    Intra-abdominal    Brief Narrative:   Benjamin Patton is Benjamin Patton 73 y.o. male with medical history significant for hypertension, hyperlipidemia, diabetes mellitus, and gangrenous cholecystitis status post laparoscopic cholecystectomy with intraoperative cholangiogram on 04/28/2024 who now returns with RUQ soreness, sweats, and fatigue.   Found to have intraabdominal abscess.   Assessment & Plan:   Principal Problem:   Postprocedural intraabdominal abscess (HCC) Active Problems:   Diabetes (HCC)   HYPERCHOLESTEROLEMIA   Essential hypertension   Elevated transaminase level  1. Intraabdominal abscess  - Hx of gangrenous cholecystitis s/p cholecystectomy with IOC on 6/3 - CT 6/28 with 6.4x7.3x6.4 cm abscess/complex collection in the cholecystectomy bed - now s/p CT guided drain placement by IR - follow drain culture - follow blood cultures  - Continue Zosyn  - appreciate surgery recommendations  # Shortness of Breath - suspect splinting related to above process - will get CT PE protocol out of abundance of caution    2. Hypertension  - Continue amlodipine  and ACE-i    3. Elevated transaminases  - No biliary dilatation on CT in ED  - improving, trend    4. Type II DM  - A1c was 8.2% in June 2025  - Check CBGs, continue long-acting insulin  with dose-reduction while NPO, add sliding-scale correctional insulin  for now      DVT prophylaxis: SCD Code Status: full Family Communication: daughter, sister, wife Disposition:   Status is: Inpatient Remains inpatient appropriate because: need for ongoing care   Consultants:  none  Procedures:  Successful CT-guided placement of 10 F catheter into GB fossa abscess.   Plan:  - Flush drain with 5 mL Normal Saline every 8 hours. - Follow up drain  evaluation / sinogram in 7 day(s).  Antimicrobials:  Anti-infectives (From admission, onward)    Start     Dose/Rate Route Frequency Ordered Stop   05/23/24 0600  piperacillin -tazobactam (ZOSYN ) IVPB 3.375 g        3.375 g 12.5 mL/hr over 240 Minutes Intravenous Every 8 hours 05/23/24 0445     05/22/24 2000  piperacillin -tazobactam (ZOSYN ) IVPB 3.375 g        3.375 g 100 mL/hr over 30 Minutes Intravenous  Once 05/22/24 1959 05/22/24 2052       Subjective: C/o SOB Pain with deep breaths  Objective: Vitals:   05/23/24 1315 05/23/24 1320 05/23/24 1325 05/23/24 1559  BP: 120/80 120/89 121/88 109/75  Pulse: 87 85 86 77  Resp: 20 (!) 28 (!) 22 18  Temp:      TempSrc:      SpO2: 96% 99% 98% 97%  Weight:      Height:        Intake/Output Summary (Last 24 hours) at 05/23/2024 1633 Last data filed at 05/23/2024 1400 Gross per 24 hour  Intake 1179.59 ml  Output 60 ml  Net 1119.59 ml   Filed Weights   05/23/24 0400  Weight: 77.6 kg    Examination:  General exam: Appears calm and comfortable  Respiratory system: unlabored Cardiovascular system: RRR Gastrointestinal system: distended, RUQ JP drain with purulent fluid  Central nervous system: Alert and oriented. No focal neurological deficits. Extremities: no LEE    Data Reviewed: I have personally reviewed following labs and imaging studies  CBC: Recent Labs  Lab  05/18/24 1351 05/22/24 1905 05/23/24 0856  WBC 14.6* 15.2* 14.7*  NEUTROABS 9.5* 10.0*  --   HGB 12.9* 12.4* 11.7*  HCT 38.4* 37.4* 35.5*  MCV 94.2 94.9 96.2  PLT 524.0* 521* 502*    Basic Metabolic Panel: Recent Labs  Lab 05/18/24 1351 05/22/24 1905 05/23/24 0856  NA 136 138 139  K 4.6 3.9 3.4*  CL 99 101 104  CO2 24 24 22   GLUCOSE 149* 154* 100*  BUN 14 18 11   CREATININE 1.14 1.13 0.98  CALCIUM  9.7 10.1 8.9    GFR: Estimated Creatinine Clearance: 71.4 mL/min (by C-G formula based on SCr of 0.98 mg/dL).  Liver Function  Tests: Recent Labs  Lab 05/18/24 1351 05/22/24 1905 05/23/24 0856  AST 73* 110* 82*  ALT 70* 127* 87*  ALKPHOS 226* 279* 182*  BILITOT 2.0* 0.6 1.2  PROT 7.7 8.5* 7.2  ALBUMIN 3.8 3.6 2.4*    CBG: Recent Labs  Lab 05/23/24 0536 05/23/24 0824 05/23/24 1206  GLUCAP 110* 88 101*     Recent Results (from the past 240 hours)  Urine Culture     Status: None   Collection Time: 05/19/24  2:31 PM   Specimen: Urine  Result Value Ref Range Status   Source: URINE  Final   Status: FINAL  Final   Result: No Growth  Final  Blood Culture (routine x 2)     Status: None (Preliminary result)   Collection Time: 05/22/24  7:25 PM   Specimen: BLOOD  Result Value Ref Range Status   Specimen Description   Final    BLOOD RIGHT ANTECUBITAL Performed at [facility], [address] [place], [place] [address]    Special Requests   Final    BOTTLES DRAWN AEROBIC AND ANAEROBIC Blood Culture adequate volume Performed at [facility], [address] [place], [place] [address]    Culture   Final    NO GROWTH < 12 HOURS Performed at [facility], [address] [place], [place] [address]    Report Status PENDING  Incomplete  Blood Culture (routine x 2)     Status: None (Preliminary result)   Collection Time: 05/22/24  7:28 PM   Specimen: BLOOD  Result Value Ref Range Status   Specimen Description   Final    BLOOD LEFT ANTECUBITAL Performed at [facility], [address] [place], [place] [address]    Special Requests   Final    BOTTLES DRAWN AEROBIC AND ANAEROBIC Blood Culture adequate volume Performed at [facility], [address] [place], [place] [address]    Culture   Final    NO GROWTH < 12 HOURS Performed at [facility], [address], [place], [place] [address]    Report Status PENDING  Incomplete         Radiology Studies: CT GUIDED VISCERAL FLUID DRAIN BY PERC  CATH Result Date: 05/23/2024 INDICATION: [id number] Intraabdominal fluid collection [id number] Briefly, 73 year old male with Benjamin Patton history of cholecystectomy 04/28/2024 presenting with gallbladder fossa abscess EXAM: CT-GUIDED ABSCESS DRAINAGE CATHETER PLACEMENT INTO Benjamin Patton GALLBLADDER FOSSA ABSCESS COMPARISON:  CT AP, 05/22/2024 MEDICATIONS: The patient is currently admitted to the hospital and receiving intravenous antibiotics. The antibiotics were administered within an appropriate time frame prior to the initiation of the procedure. ANESTHESIA/SEDATION: Moderate (conscious) sedation was employed during this procedure. Deshay Kirstein total of Versed  2 mg and Fentanyl  100 mcg was administered intravenously. Moderate Sedation Time: 11 minutes. The patient's level  of consciousness and vital signs were monitored continuously by radiology nursing throughout the procedure under my direct supervision. CONTRAST:  None FLUOROSCOPY TIME:  CT dose; 334 mGycm COMPLICATIONS: None immediate. PROCEDURE: RADIATION DOSE REDUCTION: This exam was performed according to the departmental dose-optimization program which includes automated exposure control, adjustment of the mA and/or kV according to patient size and/or use of iterative reconstruction technique. Informed written consent was obtained from the patient after Nimo Verastegui discussion of the risks, benefits and alternatives to treatment. The patient was placed supine on the CT gantry and Madhuri Vacca pre procedural CT was performed re-demonstrating the known abscess/fluid collection within the gallbladder fossa. The procedure was planned. Keyli Duross timeout was performed prior to the initiation of the procedure. The RIGHT upper quadrant was prepped and draped in the usual sterile fashion. The overlying soft tissues were anesthetized with 1% lidocaine  with epinephrine . Appropriate trajectory was planned with the use of Samantha Ragen 22 gauge spinal needle. An 18 gauge trocar needle was advanced into the abscess/fluid collection and Yuliana Vandrunen short  Amplatz super stiff wire was coiled within the collection. Appropriate positioning was confirmed with Kolson Chovanec limited CT scan. The tract was serially dilated allowing placement of Andreia Gandolfi 10 Fr drainage catheter. Appropriate positioning was confirmed with Aalani Aikens limited postprocedural CT scan. 5 mL of purulent fluid was aspirated. The tube was connected to bulb suction and sutured in place. Tameria Patti dressing was placed. The patient tolerated the procedure well without immediate post procedural complication. IMPRESSION: Successful CT-guided placement of Landyn Lorincz 10 Fr drainage catheter into the gallbladder fossa with aspiration of 5 mL of purulent fluid. Samples were sent to the laboratory as requested by the ordering clinical team. RECOMMENDATIONS: The patient will return to [facility] ([facility]) for routine drainage catheter evaluation and exchange in 7 days. Thom Hall, MD [facility] Electronically Signed   By: Thom Hall M.D.   On: 05/23/2024 15:30   DG Chest Port 1 View Result Date: 05/22/2024 CLINICAL DATA:  Questionable sepsis - evaluate for abnormality EXAM: PORTABLE CHEST - 1 VIEW COMPARISON:  May 19, 2024 FINDINGS: Low lung volumes with streaky atelectasis or scarring in the right lung base. No focal airspace consolidation, pleural effusion, or pneumothorax. No cardiomegaly. Tortuous aorta. No acute fracture or destructive lesion. Multilevel thoracic osteophytosis. IMPRESSION: No acute cardiopulmonary abnormality. Electronically Signed   By: Rogelia Myers M.D.   On: 05/22/2024 18:52   CT ABDOMEN PELVIS W CONTRAST Result Date: 05/22/2024 CLINICAL DATA:  Abdominal pain, acute, nonlocalized. EXAM: CT ABDOMEN AND PELVIS WITH CONTRAST TECHNIQUE: Multidetector CT imaging of the abdomen and pelvis was performed using the standard protocol following bolus administration of intravenous contrast. RADIATION DOSE REDUCTION: This exam was performed according to  the departmental dose-optimization program which includes automated exposure control, adjustment of the mA and/or kV according to patient size and/or use of iterative reconstruction technique. CONTRAST:  OMNIPAQUE  IOHEXOL  300 MG/ML  SOLN COMPARISON:  CT scan abdomen pelvis from 04/25/2024. FINDINGS: Lower chest: There are patchy atelectatic changes in the visualized lung bases. No overt consolidation. No pleural effusion. The heart is normal in size. No pericardial effusion. Hepatobiliary: The liver is normal in size. Non-cirrhotic configuration. No suspicious mass. No intrahepatic or extrahepatic bile duct dilation. As per the electronic medical record, patient underwent cholecystectomy on 04/28/2024. In the cholecystectomy bed, there is Damion Kant thick irregular multi septated walled-off collection measuring 6.4 x 7.3 x 6.4 cm (anteroposterior x transverse x craniocaudal), when measured including the walls. The  collection contains moderate amount of air and multiple air-fluid levels. There is mild-to-moderate surrounding fat stranding. Findings are compatible with abscess/complex collection in the cholecystectomy bed. There is loss of fat planes and reactive thickening of the anti mesenteric wall of the hepatic flexure of colon. Pancreas: Unremarkable. No pancreatic ductal dilatation or surrounding inflammatory changes. Spleen: Surgically absent. Accessory splenules noted in the left upper quadrant, posteriorly. Adrenals/Urinary Tract: Adrenal glands are unremarkable. No suspicious renal mass. Redemonstration of multiple simple cysts in bilateral kidneys with largest arising from the left kidney lower pole, posteriorly measuring 5.4 x 5.8 cm. No nephroureterolithiasis or obstructive uropathy on either side. 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 Urinary bladder is under distended, precluding optimal assessment. However, no large mass or stones identified. No perivesical fat  stranding. Stomach/Bowel: No disproportionate dilation of the small or large bowel loops. The appendix is unremarkable. There is asymmetric thickening of the anti mesenteric wall of the hepatic flexure of colon due to proximity with cholecystectomy bed abscess/collection. Vascular/Lymphatic: No ascites or pneumoperitoneum. No abdominal or pelvic lymphadenopathy, by size criteria. No aneurysmal dilation of the major abdominal arteries. Reproductive: Enlarged prostate. Symmetric seminal vesicles. Other: Periumbilical surgical scar noted. There is also probable prior right-sided orchiectomy versus hernia repair. Correlate with surgical history. The soft tissues and abdominal wall are otherwise unremarkable. Musculoskeletal: No suspicious osseous lesions. There are mild multilevel degenerative changes in the visualized spine. Old healed fracture deformity of lower sacrum noted. There is also old healed deformity of bilateral superior/inferior pubic rami and pubic symphysis. IMPRESSION: 1. There is Marisue Canion 6.4 x 7.3 x 6.4 cm abscess/complex collection in the cholecystectomy bed. There is loss of fat planes and reactive thickening of the anti mesenteric wall of the hepatic flexure of colon. 2. Multiple other nonacute observations, as described above. Electronically Signed   By: Ree Molt M.D.   On: 05/22/2024 14:14        Scheduled Meds:  amLODipine   10 mg Oral Daily   And   benazepril   20 mg Oral Daily   insulin  aspart  0-6 Units Subcutaneous Q4H   insulin  glargine-yfgn  10 Units Subcutaneous Daily   pantoprazole   20 mg Oral Daily   sodium chloride  flush  3 mL Intravenous Q12H   sodium chloride  flush  5 mL Intracatheter Q8H   Continuous Infusions:  lactated ringers  75 mL/hr at 05/23/24 9474   lactated ringers      piperacillin -tazobactam (ZOSYN )  IV 3.375 g (05/23/24 1353)     LOS: 0 days    Time spent: over 30 min     Meliton Monte, MD [facility]   To contact the attending  provider between 7A-7P or the covering provider during after hours 7P-7A, please log into the web site [email or web address] and access using universal [place] password for that web site. If you do not have the password, please call the hospital operator.  05/23/2024, 4:33 PM
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
## 2024-05-24 ENCOUNTER — Inpatient Hospital Stay (HOSPITAL_COMMUNITY)

## 2024-05-24 DIAGNOSIS — R011 Cardiac murmur, unspecified: Secondary | ICD-10-CM | POA: Diagnosis not present

## 2024-05-24 DIAGNOSIS — K651 Peritoneal abscess: Secondary | ICD-10-CM | POA: Diagnosis not present

## 2024-05-24 DIAGNOSIS — T8143XA Infection following a procedure, organ and space surgical site, initial encounter: Secondary | ICD-10-CM | POA: Diagnosis not present

## 2024-05-24 DIAGNOSIS — J9811 Atelectasis: Secondary | ICD-10-CM | POA: Insufficient documentation

## 2024-05-24 DIAGNOSIS — R1011 Right upper quadrant pain: Secondary | ICD-10-CM | POA: Insufficient documentation

## 2024-05-24 LAB — COMPREHENSIVE METABOLIC PANEL WITH GFR
ALT: 81 U/L — ABNORMAL HIGH (ref 0–44)
AST: 73 U/L — ABNORMAL HIGH (ref 15–41)
Albumin: 2.3 g/dL — ABNORMAL LOW (ref 3.5–5.0)
Alkaline Phosphatase: 175 U/L — ABNORMAL HIGH (ref 38–126)
Anion gap: 14 (ref 5–15)
BUN: 9 mg/dL (ref 8–23)
CO2: 23 mmol/L (ref 22–32)
Calcium: 9.2 mg/dL (ref 8.9–10.3)
Chloride: 101 mmol/L (ref 98–111)
Creatinine, Ser: 0.95 mg/dL (ref 0.61–1.24)
GFR, Estimated: >60 mL/min (ref >60)
Glucose, Bld: 99 mg/dL (ref 70–99)
Potassium: 3.6 mmol/L (ref 3.5–5.1)
Sodium: 138 mmol/L (ref 135–145)
Total Bilirubin: 1.1 mg/dL (ref 0.0–1.2)
Total Protein: 7.1 g/dL (ref 6.5–8.1)

## 2024-05-24 LAB — ECHOCARDIOGRAM COMPLETE
Area-P 1/2: 4.13 cm2
Calc EF: 63.5 %
Height: 70 in
S' Lateral: 3.1 cm
Single Plane A2C EF: 69.5 %
Single Plane A4C EF: 56.1 %
Weight: 2737.23 [oz_av]

## 2024-05-24 LAB — GLUCOSE, CAPILLARY
Glucose-Capillary: 89 mg/dL (ref 70–99)
Glucose-Capillary: 99 mg/dL (ref 70–99)
Glucose-Capillary: 122 mg/dL — ABNORMAL HIGH (ref 70–99)
Glucose-Capillary: 149 mg/dL — ABNORMAL HIGH (ref 70–99)

## 2024-05-24 LAB — CBC
HCT: 36.6 % — ABNORMAL LOW (ref 39.0–52.0)
Hemoglobin: 12 g/dL — ABNORMAL LOW (ref 13.0–17.0)
MCH: 31.4 pg (ref 26.0–34.0)
MCHC: 32.8 g/dL (ref 30.0–36.0)
MCV: 95.8 fL (ref 80.0–100.0)
Platelets: 491 10*3/uL — ABNORMAL HIGH (ref 150–400)
RBC: 3.82 MIL/uL — ABNORMAL LOW (ref 4.22–5.81)
RDW: 13.2 % (ref 11.5–15.5)
WBC: 11.3 10*3/uL — ABNORMAL HIGH (ref 4.0–10.5)
nRBC: 0 % (ref 0.0–0.2)

## 2024-05-24 MED ORDER — INSULIN ASPART 100 UNIT/ML IJ SOLN
0.0000 [IU] | Freq: Three times a day (TID) | INTRAMUSCULAR | Status: DC
  Administered 2024-05-24: 1 [IU] via SUBCUTANEOUS
  Administered 2024-05-25 (×3): 2 [IU] via SUBCUTANEOUS
  Administered 2024-05-26: 1 [IU] via SUBCUTANEOUS

## 2024-05-24 NOTE — Progress Notes (Signed)
 Assessment & Plan: Abscess in gallbladder fossa - status post cholecystectomy 04/28/2024 by Dr. Rubin             - admitted to medical service             - IV Zosyn  started             - percutaneous drainage by IR successful 6/28  - WBC improved 11.3K  - advance to carb modified diet  Questions from patient and wife answered at bedside.  Will follow.        Krystal Spinner, MD [facility] practice Office: [phone]        Chief Complaint: Subhepatic abscess following cholecystectomy  Subjective: Patient in bed, comfortable.  Wife at bedside.  Objective: Vital signs in last 24 hours: Temp:  [97.8 F (36.6 C)-98.5 F (36.9 C)] 98 F (36.7 C) (06/29 0927) Pulse Rate:  [77-90] 86 (06/29 0927) Resp:  [14-28] 18 (06/29 0927) BP: (103-127)/(70-89) 112/75 (06/29 0927) SpO2:  [96 %-100 %] 96 % (06/29 0927) Last BM Date : 05/21/24  Intake/Output from previous day: 06/28 0701 - 06/29 0700 In: 541.2 [I.V.:422.2; IV Piggyback:119.1] Out: 100 [Drains:100] Intake/Output this shift: Total I/O In: 120 [P.O.:120] Out: -   Physical Exam: HEENT - sclerae clear, mucous membranes moist Abdomen - soft, no mass, no tenderness; drain with purulent output in bulb  Lab Results:  Recent Labs    05/23/24 0856 05/24/24 0934  WBC 14.7* 11.3*  HGB 11.7* 12.0*  HCT 35.5* 36.6*  PLT 502* 491*   BMET Recent Labs    05/23/24 0856 05/24/24 0934  NA 139 138  K 3.4* 3.6  CL 104 101  CO2 22 23  GLUCOSE 100* 99  BUN 11 9  CREATININE 0.98 0.95  CALCIUM  8.9 9.2   PT/INR Recent Labs    05/22/24 1905  LABPROT 16.2*  INR 1.2   Comprehensive Metabolic Panel:    Component Value Date/Time   NA 138 05/24/2024 0934   NA 139 05/23/2024 0856   K 3.6 05/24/2024 0934   K 3.4 (L) 05/23/2024 0856   CL 101 05/24/2024 0934   CL 104 05/23/2024 0856   CO2 23 05/24/2024 0934   CO2 22 05/23/2024 0856   BUN 9 05/24/2024 0934   BUN 11 05/23/2024 0856    CREATININE 0.95 05/24/2024 0934   CREATININE 0.98 05/23/2024 0856   CREATININE 0.94 04/07/2015 1015   CREATININE 0.82 09/05/2013 1050   GLUCOSE 99 05/24/2024 0934   GLUCOSE 100 (H) 05/23/2024 0856   CALCIUM  9.2 05/24/2024 0934   CALCIUM  8.9 05/23/2024 0856   AST 73 (H) 05/24/2024 0934   AST 82 (H) 05/23/2024 0856   ALT 81 (H) 05/24/2024 0934   ALT 87 (H) 05/23/2024 0856   ALKPHOS 175 (H) 05/24/2024 0934   ALKPHOS 182 (H) 05/23/2024 0856   BILITOT 1.1 05/24/2024 0934   BILITOT 1.2 05/23/2024 0856   PROT 7.1 05/24/2024 0934   PROT 7.2 05/23/2024 0856   ALBUMIN 2.3 (L) 05/24/2024 0934   ALBUMIN 2.4 (L) 05/23/2024 0856    Studies/Results: CT Angio Chest Pulmonary Embolism (PE) W or WO Contrast Result Date: 05/23/2024 CLINICAL DATA:  Shortness of breath EXAM: CT ANGIOGRAPHY CHEST WITH CONTRAST TECHNIQUE: Multidetector CT imaging of the chest was performed using the standard protocol during bolus administration of intravenous contrast. Multiplanar CT image reconstructions and MIPs were obtained to evaluate the vascular anatomy. RADIATION DOSE REDUCTION: This exam was performed  according to the departmental dose-optimization program which includes automated exposure control, adjustment of the mA and/or kV according to patient size and/or use of iterative reconstruction technique. CONTRAST:  75mL OMNIPAQUE  IOHEXOL  350 MG/ML SOLN COMPARISON:  Radiograph 05/22/2024 FINDINGS: Cardiovascular: No pericardial effusion. Normal caliber aorta without dissection. Negative for acute pulmonary embolism. Mediastinum/Nodes: Trachea and esophagus are unremarkable. No thoracic adenopathy. Lungs/Pleura: Right greater than left lower lobe atelectasis. No pleural effusion or pneumothorax. Upper Abdomen: Interval placement of a percutaneous drain in the cholecystectomy bed since abdomen pelvis CT 05/22/2024. A gas and fluid collection is redemonstrated in the cholecystectomy bed. Musculoskeletal: No acute fracture.  Review of the MIP images confirms the above findings. IMPRESSION: 1. Negative for acute pulmonary embolism. 2. Interval placement of a percutaneous drain in the gas and fluid collection in the cholecystectomy bed compared to CT abdomen pelvis 05/22/2024. Electronically Signed   By: Norman Gatlin M.D.   On: 05/23/2024 19:14   CT GUIDED VISCERAL FLUID DRAIN BY PERC CATH Result Date: 05/23/2024 INDICATION: [id number] Intraabdominal fluid collection [id number] Briefly, 73 year old male with a history of cholecystectomy 04/28/2024 presenting with gallbladder fossa abscess EXAM: CT-GUIDED ABSCESS DRAINAGE CATHETER PLACEMENT INTO A GALLBLADDER FOSSA ABSCESS COMPARISON:  CT AP, 05/22/2024 MEDICATIONS: The patient is currently admitted to the hospital and receiving intravenous antibiotics. The antibiotics were administered within an appropriate time frame prior to the initiation of the procedure. ANESTHESIA/SEDATION: Moderate (conscious) sedation was employed during this procedure. A total of Versed  2 mg and Fentanyl  100 mcg was administered intravenously. Moderate Sedation Time: 11 minutes. The patient's level of consciousness and vital signs were monitored continuously by radiology nursing throughout the procedure under my direct supervision. CONTRAST:  None FLUOROSCOPY TIME:  CT dose; 334 mGycm COMPLICATIONS: None immediate. PROCEDURE: RADIATION DOSE REDUCTION: This exam was performed according to the departmental dose-optimization program which includes automated exposure control, adjustment of the mA and/or kV according to patient size and/or use of iterative reconstruction technique. Informed written consent was obtained from the patient after a discussion of the risks, benefits and alternatives to treatment. The patient was placed supine on the CT gantry and a pre procedural CT was performed re-demonstrating the known abscess/fluid collection within the gallbladder fossa. The procedure was planned. A timeout was  performed prior to the initiation of the procedure. The RIGHT upper quadrant was prepped and draped in the usual sterile fashion. The overlying soft tissues were anesthetized with 1% lidocaine  with epinephrine . Appropriate trajectory was planned with the use of a 22 gauge spinal needle. An 18 gauge trocar needle was advanced into the abscess/fluid collection and a short Amplatz super stiff wire was coiled within the collection. Appropriate positioning was confirmed with a limited CT scan. The tract was serially dilated allowing placement of a 10 Fr drainage catheter. Appropriate positioning was confirmed with a limited postprocedural CT scan. 5 mL of purulent fluid was aspirated. The tube was connected to bulb suction and sutured in place. A dressing was placed. The patient tolerated the procedure well without immediate post procedural complication. IMPRESSION: Successful CT-guided placement of a 10 Fr drainage catheter into the gallbladder fossa with aspiration of 5 mL of purulent fluid. Samples were sent to the laboratory as requested by the ordering clinical team. RECOMMENDATIONS: The patient will return to [facility] ([facility]IR) for routine drainage catheter evaluation and exchange in 7 days. Thom Hall, MD [facility] Electronically Signed   By: Thom Hall M.D.   On: 05/23/2024 15:30  DG Chest Port 1 View Result Date: 05/22/2024 CLINICAL DATA:  Questionable sepsis - evaluate for abnormality EXAM: PORTABLE CHEST - 1 VIEW COMPARISON:  May 19, 2024 FINDINGS: Low lung volumes with streaky atelectasis or scarring in the right lung base. No focal airspace consolidation, pleural effusion, or pneumothorax. No cardiomegaly. Tortuous aorta. No acute fracture or destructive lesion. Multilevel thoracic osteophytosis. IMPRESSION: No acute cardiopulmonary abnormality. Electronically Signed   By: Rogelia Myers M.D.   On: 05/22/2024 18:52    CT ABDOMEN PELVIS W CONTRAST Result Date: 05/22/2024 CLINICAL DATA:  Abdominal pain, acute, nonlocalized. EXAM: CT ABDOMEN AND PELVIS WITH CONTRAST TECHNIQUE: Multidetector CT imaging of the abdomen and pelvis was performed using the standard protocol following bolus administration of intravenous contrast. RADIATION DOSE REDUCTION: This exam was performed according to the departmental dose-optimization program which includes automated exposure control, adjustment of the mA and/or kV according to patient size and/or use of iterative reconstruction technique. CONTRAST:  OMNIPAQUE  IOHEXOL  300 MG/ML  SOLN COMPARISON:  CT scan abdomen pelvis from 04/25/2024. FINDINGS: Lower chest: There are patchy atelectatic changes in the visualized lung bases. No overt consolidation. No pleural effusion. The heart is normal in size. No pericardial effusion. Hepatobiliary: The liver is normal in size. Non-cirrhotic configuration. No suspicious mass. No intrahepatic or extrahepatic bile duct dilation. As per the electronic medical record, patient underwent cholecystectomy on 04/28/2024. In the cholecystectomy bed, there is a thick irregular multi septated walled-off collection measuring 6.4 x 7.3 x 6.4 cm (anteroposterior x transverse x craniocaudal), when measured including the walls. The collection contains moderate amount of air and multiple air-fluid levels. There is mild-to-moderate surrounding fat stranding. Findings are compatible with abscess/complex collection in the cholecystectomy bed. There is loss of fat planes and reactive thickening of the anti mesenteric wall of the hepatic flexure of colon. Pancreas: Unremarkable. No pancreatic ductal dilatation or surrounding inflammatory changes. Spleen: Surgically absent. Accessory splenules noted in the left upper quadrant, posteriorly. Adrenals/Urinary Tract: Adrenal glands are unremarkable. No suspicious renal mass. Redemonstration of multiple simple cysts in bilateral  kidneys with largest arising from the left kidney lower pole, posteriorly measuring 5.4 x 5.8 cm. No nephroureterolithiasis or obstructive uropathy on either side. 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 Urinary bladder is under distended, precluding optimal assessment. However, no large mass or stones identified. No perivesical fat stranding. Stomach/Bowel: No disproportionate dilation of the small or large bowel loops. The appendix is unremarkable. There is asymmetric thickening of the anti mesenteric wall of the hepatic flexure of colon due to proximity with cholecystectomy bed abscess/collection. Vascular/Lymphatic: No ascites or pneumoperitoneum. No abdominal or pelvic lymphadenopathy, by size criteria. No aneurysmal dilation of the major abdominal arteries. Reproductive: Enlarged prostate. Symmetric seminal vesicles. Other: Periumbilical surgical scar noted. There is also probable prior right-sided orchiectomy versus hernia repair. Correlate with surgical history. The soft tissues and abdominal wall are otherwise unremarkable. Musculoskeletal: No suspicious osseous lesions. There are mild multilevel degenerative changes in the visualized spine. Old healed fracture deformity of lower sacrum noted. There is also old healed deformity of bilateral superior/inferior pubic rami and pubic symphysis. IMPRESSION: 1. There is a 6.4 x 7.3 x 6.4 cm abscess/complex collection in the cholecystectomy bed. There is loss of fat planes and reactive thickening of the anti mesenteric wall of the hepatic flexure of colon. 2. Multiple other nonacute observations, as described above. Electronically Signed   By: Ree Molt M.D.   On: 05/22/2024 14:14  Krystal Spinner 05/24/2024  Patient ID: Benjamin Patton, male   DOB: December 14, 1950, 73 y.o.   MRN: [id number]

## 2024-05-24 NOTE — Assessment & Plan Note (Signed)
 BP Readings from Last 3 Encounters:  05/24/24 112/75  05/21/24 116/82  05/19/24 102/64   Stable, pt to continue medical treatment norvasc  10 every day, lotensin  20 qd

## 2024-05-24 NOTE — Plan of Care (Signed)
 Pt receiving antibiotics and ambulating in the hallways.

## 2024-05-24 NOTE — Progress Notes (Signed)
 Referring Physician(s): Sherren Boas   Supervising Physician: Hughes Simmonds  Patient Status:  [facility] - In-pt  Chief Complaint:  S/p cholecystectomy 6/3 presents with GB fossa fluid collection S/p drain placement on 6/29 by Dr. Hughes   Subjective:  Laying in bed, NAD, Daughter at bedside.  Report that he is going good, no abd pain n/v.   Allergies: Metformin  and related  Medications: Prior to Admission medications   Medication Sig Start Date End Date Taking? Authorizing Provider  amLODipine -benazepril  (LOTREL) 10-20 MG capsule Take 1 capsule by mouth daily.   Yes [provider]  Cholecalciferol  (VITAMIN D -3 PO) Take 1 capsule by mouth daily.   Yes [provider]  cyclobenzaprine  (FLEXERIL ) 10 MG tablet Take 1 tablet (10 mg total) by mouth at bedtime. 05/19/24  Yes Sagardia, Emil Schanz, MD  famotidine  (PEPCID ) 20 MG tablet Take 1 tablet (20 mg total) by mouth 2 (two) times daily. Patient taking differently: Take 20 mg by mouth 2 (two) times daily as needed for heartburn. 04/25/24  Yes Flint Raring K, PA-C  glipiZIDE  (GLUCOTROL  XL) 10 MG 24 hr tablet Take 1 tablet (10 mg total) by mouth every morning. 02/26/24  Yes Norleen Lynwood ORN, MD  HYDROcodone -acetaminophen  (NORCO/VICODIN) 5-325 MG tablet Take 1-2 tablets by mouth every 6 (six) hours as needed for moderate pain (pain score 4-6). 04/29/24  Yes Vicci Burnard SAUNDERS, PA-C  ibuprofen (ADVIL) 200 MG tablet Take 400 mg by mouth 2 (two) times daily as needed for headache or moderate pain (pain score 4-6).   Yes [provider]  insulin  glargine (LANTUS  SOLOSTAR) 100 UNIT/ML Solostar Pen Inject 30 Units into the skin daily. E11.9 05/18/24  Yes Norleen Lynwood ORN, MD  pantoprazole  (PROTONIX ) 20 MG tablet Take 1 tablet (20 mg total) by mouth daily. Patient taking differently: Take 20 mg by mouth daily as needed for heartburn. 04/25/24  Yes Flint Raring POUR, PA-C  pioglitazone  (ACTOS ) 45 MG tablet Take 1 tablet (45 mg total) by  mouth daily. 02/26/24  Yes Norleen Lynwood ORN, MD  rosuvastatin  (CRESTOR ) 20 MG tablet Take 1 tablet (20 mg total) by mouth daily. Patient taking differently: Take 20 mg by mouth at bedtime. 02/26/24  Yes Norleen Lynwood ORN, MD  polyethylene glycol (MIRALAX  / GLYCOLAX ) 17 g packet Take 17 g by mouth daily as needed. Patient not taking: Reported on 05/23/2024 04/29/24   Vicci Burnard SAUNDERS, PA-C     Vital Signs: BP 112/75 (BP Location: Left Arm)   Pulse 86   Temp 98 F (36.7 C) (Oral)   Resp 18   Ht 5' 10 (1.778 m)   Wt 171 lb 1.2 oz (77.6 kg)   SpO2 96%   BMI 24.55 kg/m   Physical Exam Vitals reviewed.  Constitutional:      General: He is not in acute distress.    Appearance: He is not ill-appearing.  HENT:     Head: Normocephalic and atraumatic.  Pulmonary:     Effort: Pulmonary effort is normal.  Abdominal:     General: Abdomen is flat.     Comments: Positive RUQ drain to suction bulb. Site is unremarkable with no erythema, edema, tenderness, bleeding or drainage. Suture and stat lock in place. Dressing is clean, dry, and intact. 20 ml of  brown purulent fluid noted in the bulb. Drain aspirates and flushes well.     Skin:    General: Skin is warm and dry.     Coloration: Skin is not cyanotic  or jaundiced.   Neurological:     Mental Status: He is alert and oriented to person, place, and time.   Psychiatric:        Mood and Affect: Mood normal.        Behavior: Behavior normal.     Imaging: CT Angio Chest Pulmonary Embolism (PE) W or WO Contrast Result Date: 05/23/2024 CLINICAL DATA:  Shortness of breath EXAM: CT ANGIOGRAPHY CHEST WITH CONTRAST TECHNIQUE: Multidetector CT imaging of the chest was performed using the standard protocol during bolus administration of intravenous contrast. Multiplanar CT image reconstructions and MIPs were obtained to evaluate the vascular anatomy. RADIATION DOSE REDUCTION: This exam was performed according to the departmental dose-optimization program  which includes automated exposure control, adjustment of the mA and/or kV according to patient size and/or use of iterative reconstruction technique. CONTRAST:  75mL OMNIPAQUE  IOHEXOL  350 MG/ML SOLN COMPARISON:  Radiograph 05/22/2024 FINDINGS: Cardiovascular: No pericardial effusion. Normal caliber aorta without dissection. Negative for acute pulmonary embolism. Mediastinum/Nodes: Trachea and esophagus are unremarkable. No thoracic adenopathy. Lungs/Pleura: Right greater than left lower lobe atelectasis. No pleural effusion or pneumothorax. Upper Abdomen: Interval placement of a percutaneous drain in the cholecystectomy bed since abdomen pelvis CT 05/22/2024. A gas and fluid collection is redemonstrated in the cholecystectomy bed. Musculoskeletal: No acute fracture. Review of the MIP images confirms the above findings. IMPRESSION: 1. Negative for acute pulmonary embolism. 2. Interval placement of a percutaneous drain in the gas and fluid collection in the cholecystectomy bed compared to CT abdomen pelvis 05/22/2024. Electronically Signed   By: Norman Gatlin M.D.   On: 05/23/2024 19:14   CT GUIDED VISCERAL FLUID DRAIN BY PERC CATH Result Date: 05/23/2024 INDICATION: [id number] Intraabdominal fluid collection [id number] Briefly, 73 year old male with a history of cholecystectomy 04/28/2024 presenting with gallbladder fossa abscess EXAM: CT-GUIDED ABSCESS DRAINAGE CATHETER PLACEMENT INTO A GALLBLADDER FOSSA ABSCESS COMPARISON:  CT AP, 05/22/2024 MEDICATIONS: The patient is currently admitted to the hospital and receiving intravenous antibiotics. The antibiotics were administered within an appropriate time frame prior to the initiation of the procedure. ANESTHESIA/SEDATION: Moderate (conscious) sedation was employed during this procedure. A total of Versed  2 mg and Fentanyl  100 mcg was administered intravenously. Moderate Sedation Time: 11 minutes. The patient's level of consciousness and vital signs were monitored  continuously by radiology nursing throughout the procedure under my direct supervision. CONTRAST:  None FLUOROSCOPY TIME:  CT dose; 334 mGycm COMPLICATIONS: None immediate. PROCEDURE: RADIATION DOSE REDUCTION: This exam was performed according to the departmental dose-optimization program which includes automated exposure control, adjustment of the mA and/or kV according to patient size and/or use of iterative reconstruction technique. Informed written consent was obtained from the patient after a discussion of the risks, benefits and alternatives to treatment. The patient was placed supine on the CT gantry and a pre procedural CT was performed re-demonstrating the known abscess/fluid collection within the gallbladder fossa. The procedure was planned. A timeout was performed prior to the initiation of the procedure. The RIGHT upper quadrant was prepped and draped in the usual sterile fashion. The overlying soft tissues were anesthetized with 1% lidocaine  with epinephrine . Appropriate trajectory was planned with the use of a 22 gauge spinal needle. An 18 gauge trocar needle was advanced into the abscess/fluid collection and a short Amplatz super stiff wire was coiled within the collection. Appropriate positioning was confirmed with a limited CT scan. The tract was serially dilated allowing placement of a 10 Fr drainage catheter. Appropriate positioning was confirmed with a limited  postprocedural CT scan. 5 mL of purulent fluid was aspirated. The tube was connected to bulb suction and sutured in place. A dressing was placed. The patient tolerated the procedure well without immediate post procedural complication. IMPRESSION: Successful CT-guided placement of a 10 Fr drainage catheter into the gallbladder fossa with aspiration of 5 mL of purulent fluid. Samples were sent to the laboratory as requested by the ordering clinical team. RECOMMENDATIONS: The patient will return to Vascular Interventional Radiology (VIR) for  routine drainage catheter evaluation and exchange in 7 days. Thom Hall, MD [facility] Electronically Signed   By: Thom Hall M.D.   On: 05/23/2024 15:30   DG Chest Port 1 View Result Date: 05/22/2024 CLINICAL DATA:  Questionable sepsis - evaluate for abnormality EXAM: PORTABLE CHEST - 1 VIEW COMPARISON:  May 19, 2024 FINDINGS: Low lung volumes with streaky atelectasis or scarring in the right lung base. No focal airspace consolidation, pleural effusion, or pneumothorax. No cardiomegaly. Tortuous aorta. No acute fracture or destructive lesion. Multilevel thoracic osteophytosis. IMPRESSION: No acute cardiopulmonary abnormality. Electronically Signed   By: Rogelia Myers M.D.   On: 05/22/2024 18:52   CT ABDOMEN PELVIS W CONTRAST Result Date: 05/22/2024 CLINICAL DATA:  Abdominal pain, acute, nonlocalized. EXAM: CT ABDOMEN AND PELVIS WITH CONTRAST TECHNIQUE: Multidetector CT imaging of the abdomen and pelvis was performed using the standard protocol following bolus administration of intravenous contrast. RADIATION DOSE REDUCTION: This exam was performed according to the departmental dose-optimization program which includes automated exposure control, adjustment of the mA and/or kV according to patient size and/or use of iterative reconstruction technique. CONTRAST:  OMNIPAQUE  IOHEXOL  300 MG/ML  SOLN COMPARISON:  CT scan abdomen pelvis from 04/25/2024. FINDINGS: Lower chest: There are patchy atelectatic changes in the visualized lung bases. No overt consolidation. No pleural effusion. The heart is normal in size. No pericardial effusion. Hepatobiliary: The liver is normal in size. Non-cirrhotic configuration. No suspicious mass. No intrahepatic or extrahepatic bile duct dilation. As per the electronic medical record, patient underwent cholecystectomy on 04/28/2024. In the cholecystectomy bed, there is a thick irregular multi septated walled-off  collection measuring 6.4 x 7.3 x 6.4 cm (anteroposterior x transverse x craniocaudal), when measured including the walls. The collection contains moderate amount of air and multiple air-fluid levels. There is mild-to-moderate surrounding fat stranding. Findings are compatible with abscess/complex collection in the cholecystectomy bed. There is loss of fat planes and reactive thickening of the anti mesenteric wall of the hepatic flexure of colon. Pancreas: Unremarkable. No pancreatic ductal dilatation or surrounding inflammatory changes. Spleen: Surgically absent. Accessory splenules noted in the left upper quadrant, posteriorly. Adrenals/Urinary Tract: Adrenal glands are unremarkable. No suspicious renal mass. Redemonstration of multiple simple cysts in bilateral kidneys with largest arising from the left kidney lower pole, posteriorly measuring 5.4 x 5.8 cm. No nephroureterolithiasis or obstructive uropathy on either side. 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 Urinary bladder is under distended, precluding optimal assessment. However, no large mass or stones identified. No perivesical fat stranding. Stomach/Bowel: No disproportionate dilation of the small or large bowel loops. The appendix is unremarkable. There is asymmetric thickening of the anti mesenteric wall of the hepatic flexure of colon due to proximity with cholecystectomy bed abscess/collection. Vascular/Lymphatic: No ascites or pneumoperitoneum. No abdominal or pelvic lymphadenopathy, by size criteria. No aneurysmal dilation of the major abdominal arteries. Reproductive: Enlarged prostate. Symmetric seminal vesicles. Other: Periumbilical surgical scar noted. There is also probable prior right-sided  orchiectomy versus hernia repair. Correlate with surgical history. The soft tissues and abdominal wall are otherwise unremarkable. Musculoskeletal: No suspicious osseous lesions. There are mild multilevel degenerative  changes in the visualized spine. Old healed fracture deformity of lower sacrum noted. There is also old healed deformity of bilateral superior/inferior pubic rami and pubic symphysis. IMPRESSION: 1. There is a 6.4 x 7.3 x 6.4 cm abscess/complex collection in the cholecystectomy bed. There is loss of fat planes and reactive thickening of the anti mesenteric wall of the hepatic flexure of colon. 2. Multiple other nonacute observations, as described above. Electronically Signed   By: Ree Molt M.D.   On: 05/22/2024 14:14    Labs:  CBC: Recent Labs    05/18/24 1351 05/22/24 1905 05/23/24 0856 05/24/24 0934  WBC 14.6* 15.2* 14.7* 11.3*  HGB 12.9* 12.4* 11.7* 12.0*  HCT 38.4* 37.4* 35.5* 36.6*  PLT 524.0* 521* 502* 491*    COAGS: Recent Labs    05/22/24 1905  INR 1.2    BMP: Recent Labs    05/02/24 0415 05/18/24 1351 05/22/24 1905 05/23/24 0856 05/24/24 0934  NA 135 136 138 139 138  K 3.2* 4.6 3.9 3.4* 3.6  CL 103 99 101 104 101  CO2 23 24 24 22 23   GLUCOSE 92 149* 154* 100* 99  BUN 9 14 18 11 9   CALCIUM  8.1* 9.7 10.1 8.9 9.2  CREATININE 0.95 1.14 1.13 0.98 0.95  GFRNONAA >60  --  >60 >60 >60    LIVER FUNCTION TESTS: Recent Labs    05/18/24 1351 05/22/24 1905 05/23/24 0856 05/24/24 0934  BILITOT 2.0* 0.6 1.2 1.1  AST 73* 110* 82* 73*  ALT 70* 127* 87* 81*  ALKPHOS 226* 279* 182* 175*  PROT 7.7 8.5* 7.2 7.1  ALBUMIN 3.8 3.6 2.4* 2.3*    Assessment and Plan:  73 y.o. male with s/p cholecystectomy 6/3 presents with GB fossa fluid collection, s/p drain placement by Dr. Hughes on 6/28.   VSS Leukocytosis and LFT improving  Cx pending  Output brown, purulent   Drain Location: RUQ Size: Fr size: 10 Fr Date of placement: 6/28  Currently to: Drain collection device: suction bulb 24 hour output:  Output by Drain (mL) 05/22/24 0701 - 05/22/24 1900 05/22/24 1901 - 05/23/24 0700 05/23/24 0701 - 05/23/24 1900 05/23/24 1901 - 05/24/24 0700 05/24/24 0701 -  05/24/24 1223  Closed System Drain 1 Lateral RLQ Bulb (JP) 10 Fr.   60 40     Interval imaging/drain manipulation:  None   Current examination: Flushes/aspirates easily.  Insertion site unremarkable. Suture and stat lock in place. Dressed appropriately.   Plan: Continue TID flushes with 5 cc NS. Record output Q shift. Dressing changes QD or PRN if soiled.  Call IR APP or on call IR MD if difficulty flushing or sudden change in drain output.  Repeat imaging/possible drain injection once output < 10 mL/QD (excluding flush material). Consideration for drain removal if output is < 10 mL/QD (excluding flush material), pending discussion with the providing surgical service.  Discharge planning: Please contact IR APP or on call IR MD prior to patient d/c to ensure appropriate follow up plans are in place. Typically patient will follow up with IR clinic 10-14 days post d/c for repeat imaging/possible drain injection. IR scheduler will contact patient with date/time of appointment. Patient will need to flush drain QD with 5 cc NS, record output QD, dressing changes every 2-3 days or earlier if soiled.   IR will  continue to follow - please call with questions or concerns.  Electronically Signed: Toya VEAR Cousin, PA-C 05/24/2024, 12:21 PM   I spent a total of 15 Minutes at the the patient's bedside AND on the patient's hospital floor or unit, greater than 50% of which was counseling/coordinating care for GB fossa drain   This chart was dictated using voice recognition software.  Despite best efforts to proofread,  errors can occur which can change the documentation meaning.

## 2024-05-24 NOTE — Progress Notes (Signed)
  Echocardiogram 2D Echocardiogram has been performed.  Devora Ellouise SAUNDERS 05/24/2024, 2:45 PM

## 2024-05-24 NOTE — Plan of Care (Signed)
  Problem: Pain Managment: Goal: General experience of comfort will improve and/or be controlled Outcome: Progressing   Problem: Safety: Goal: Ability to remain free from injury will improve Outcome: Progressing

## 2024-05-24 NOTE — Progress Notes (Addendum)
 PROGRESS NOTE    Benjamin Patton  FMW:[id number] DOB: [birth year] DOA: 05/22/2024 PCP: Norleen Lynwood LELON, MD  Chief Complaint  Patient presents with   Abdominal Pain   Abscess    Intra-abdominal    Brief Narrative:   Benjamin Patton is Benjamin Patton 73 y.o. male with medical history significant for hypertension, hyperlipidemia, diabetes mellitus, and gangrenous cholecystitis status post laparoscopic cholecystectomy with intraoperative cholangiogram on 04/28/2024 who now returns with RUQ soreness, sweats, and fatigue.   Found to have intraabdominal abscess.   Assessment & Plan:   Principal Problem:   Postprocedural intraabdominal abscess (HCC) Active Problems:   Diabetes (HCC)   HYPERCHOLESTEROLEMIA   Essential hypertension   Elevated transaminase level  1. Intraabdominal abscess  - Hx of gangrenous cholecystitis s/p cholecystectomy with IOC on 6/3 - CT 6/28 with 6.4x7.3x6.4 cm abscess/complex collection in the cholecystectomy bed - now s/p CT guided drain placement by IR - IR recommending repeat imaging/possible drain injection when output less than 10 cc/day - follow drain culture - follow blood cultures  - Continue Zosyn  - appreciate surgery recommendations  # Shortness of Breath - suspect splinting related to above process  - echo - CT PE protocol without PE   2. Hypertension  - Continue amlodipine  and ACE-i    3. Elevated transaminases  - No biliary dilatation on CT in ED  - improving, trend    4. Type II DM  - A1c was 8.2% in June 2025  - Check CBGs, continue long-acting insulin  with dose-reduction while NPO, add sliding-scale correctional insulin  for now      DVT prophylaxis: SCD Code Status: full Family Communication: daughter at bedside Disposition:   Status is: Inpatient Remains inpatient appropriate because: need for ongoing care   Consultants:  none  Procedures:  Successful CT-guided placement of 10 F catheter into GB fossa abscess.   Plan:  - Flush  drain with 5 mL Normal Saline every 8 hours. - Follow up drain evaluation / sinogram in 7 day(s).  Antimicrobials:  Anti-infectives (From admission, onward)    Start     Dose/Rate Route Frequency Ordered Stop   05/23/24 0600  piperacillin -tazobactam (ZOSYN ) IVPB 3.375 g        3.375 g 12.5 mL/hr over 240 Minutes Intravenous Every 8 hours 05/23/24 0445     05/22/24 2000  piperacillin -tazobactam (ZOSYN ) IVPB 3.375 g        3.375 g 100 mL/hr over 30 Minutes Intravenous  Once 05/22/24 1959 05/22/24 2052       Subjective: Continued abdominal discomfort, pleuritic pain   Objective: Vitals:   05/23/24 2008 05/24/24 0512 05/24/24 0818 05/24/24 0927  BP: 113/81 111/75 122/79 112/75  Pulse: 90 84 83 86  Resp:   18 18  Temp: 98.4 F (36.9 C) 97.9 F (36.6 C) 97.8 F (36.6 C) 98 F (36.7 C)  TempSrc: Oral Oral Oral Oral  SpO2: 96% 96% 96% 96%  Weight:      Height:        Intake/Output Summary (Last 24 hours) at 05/24/2024 1425 Last data filed at 05/24/2024 1404 Gross per 24 hour  Intake 666.22 ml  Output 65 ml  Net 601.22 ml   Filed Weights   05/23/24 0400  Weight: 77.6 kg    Examination:  General: No acute distress. Cardiovascular: RRR Lungs: unlabored Abdomen: mildly tender - JP drain with purulent fluid Neurological: Alert and oriented 3. Moves all extremities 4 with equal strength. Cranial nerves II through XII grossly intact.  Extremities: No clubbing or cyanosis. No edema.   Data Reviewed: I have personally reviewed following labs and imaging studies  CBC: Recent Labs  Lab 05/18/24 1351 05/22/24 1905 05/23/24 0856 05/24/24 0934  WBC 14.6* 15.2* 14.7* 11.3*  NEUTROABS 9.5* 10.0*  --   --   HGB 12.9* 12.4* 11.7* 12.0*  HCT 38.4* 37.4* 35.5* 36.6*  MCV 94.2 94.9 96.2 95.8  PLT 524.0* 521* 502* 491*    Basic Metabolic Panel: Recent Labs  Lab 05/18/24 1351 05/22/24 1905 05/23/24 0856 05/24/24 0934  NA 136 138 139 138  K 4.6 3.9 3.4* 3.6  CL 99  101 104 101  CO2 24 24 22 23   GLUCOSE 149* 154* 100* 99  BUN 14 18 11 9   CREATININE 1.14 1.13 0.98 0.95  CALCIUM  9.7 10.1 8.9 9.2    GFR: Estimated Creatinine Clearance: 73.6 mL/min (by C-G formula based on SCr of 0.95 mg/dL).  Liver Function Tests: Recent Labs  Lab 05/18/24 1351 05/22/24 1905 05/23/24 0856 05/24/24 0934  AST 73* 110* 82* 73*  ALT 70* 127* 87* 81*  ALKPHOS 226* 279* 182* 175*  BILITOT 2.0* 0.6 1.2 1.1  PROT 7.7 8.5* 7.2 7.1  ALBUMIN 3.8 3.6 2.4* 2.3*    CBG: Recent Labs  Lab 05/23/24 2006 05/23/24 2333 05/24/24 0500 05/24/24 0824 05/24/24 1214  GLUCAP 113* 99 89 99 122*     Recent Results (from the past 240 hours)  Urine Culture     Status: None   Collection Time: 05/19/24  2:31 PM   Specimen: Urine  Result Value Ref Range Status   Source: URINE  Final   Status: FINAL  Final   Result: No Growth  Final  Blood Culture (routine x 2)     Status: None (Preliminary result)   Collection Time: 05/22/24  7:25 PM   Specimen: BLOOD  Result Value Ref Range Status   Specimen Description   Final    BLOOD RIGHT ANTECUBITAL Performed at [facility], [address], [place], [place] [address]    Special Requests   Final    BOTTLES DRAWN AEROBIC AND ANAEROBIC Blood Culture adequate volume Performed at [facility], [address], [place], [place] [address]    Culture   Final    NO GROWTH 2 DAYS Performed at [facility], [address] [place], [place] [address]    Report Status PENDING  Incomplete  Blood Culture (routine x 2)     Status: None (Preliminary result)   Collection Time: 05/22/24  7:28 PM   Specimen: BLOOD  Result Value Ref Range Status   Specimen Description   Final    BLOOD LEFT ANTECUBITAL Performed at [facility], [address] [place], [place] [address]    Special Requests   Final    BOTTLES DRAWN AEROBIC AND ANAEROBIC Blood Culture adequate volume Performed  at [facility], [address] [place], [place] [address]    Culture   Final    NO GROWTH 2 DAYS Performed at [facility], [address] [place], [place] [address]    Report Status PENDING  Incomplete  Aerobic/Anaerobic Culture w Gram Stain (surgical/deep wound)     Status: None (Preliminary result)   Collection Time: 05/23/24 11:56 AM   Specimen: Abscess  Result Value Ref Range Status   Specimen Description ABSCESS  Final   Special Requests ABSCESS  Final   Gram Stain   Final    MODERATE  WBC PRESENT,BOTH PMN AND MONONUCLEAR NO ORGANISMS SEEN    Culture   Final    NO GROWTH < 24 HOURS Performed at [facility], [address] [place], [place] [address]    Report Status PENDING  Incomplete         Radiology Studies: CT Angio Chest Pulmonary Embolism (PE) W or WO Contrast Result Date: 05/23/2024 CLINICAL DATA:  Shortness of breath EXAM: CT ANGIOGRAPHY CHEST WITH CONTRAST TECHNIQUE: Multidetector CT imaging of the chest was performed using the standard protocol during bolus administration of intravenous contrast. Multiplanar CT image reconstructions and MIPs were obtained to evaluate the vascular anatomy. RADIATION DOSE REDUCTION: This exam was performed according to the departmental dose-optimization program which includes automated exposure control, adjustment of the mA and/or kV according to patient size and/or use of iterative reconstruction technique. CONTRAST:  75mL OMNIPAQUE  IOHEXOL  350 MG/ML SOLN COMPARISON:  Radiograph 05/22/2024 FINDINGS: Cardiovascular: No pericardial effusion. Normal caliber aorta without dissection. Negative for acute pulmonary embolism. Mediastinum/Nodes: Trachea and esophagus are unremarkable. No thoracic adenopathy. Lungs/Pleura: Right greater than left lower lobe atelectasis. No pleural effusion or pneumothorax. Upper Abdomen: Interval placement of Lachrista Heslin percutaneous drain in the cholecystectomy bed since abdomen pelvis CT  05/22/2024. Jarmarcus Wambold gas and fluid collection is redemonstrated in the cholecystectomy bed. Musculoskeletal: No acute fracture. Review of the MIP images confirms the above findings. IMPRESSION: 1. Negative for acute pulmonary embolism. 2. Interval placement of Abbagale Goguen percutaneous drain in the gas and fluid collection in the cholecystectomy bed compared to CT abdomen pelvis 05/22/2024. Electronically Signed   By: Norman Gatlin M.D.   On: 05/23/2024 19:14   CT GUIDED VISCERAL FLUID DRAIN BY PERC CATH Result Date: 05/23/2024 INDICATION: [id number] Intraabdominal fluid collection [id number] Briefly, 73 year old male with Kaylan Yates history of cholecystectomy 04/28/2024 presenting with gallbladder fossa abscess EXAM: CT-GUIDED ABSCESS DRAINAGE CATHETER PLACEMENT INTO Micai Apolinar GALLBLADDER FOSSA ABSCESS COMPARISON:  CT AP, 05/22/2024 MEDICATIONS: The patient is currently admitted to the hospital and receiving intravenous antibiotics. The antibiotics were administered within an appropriate time frame prior to the initiation of the procedure. ANESTHESIA/SEDATION: Moderate (conscious) sedation was employed during this procedure. Cale Bethard total of Versed  2 mg and Fentanyl  100 mcg was administered intravenously. Moderate Sedation Time: 11 minutes. The patient's level of consciousness and vital signs were monitored continuously by radiology nursing throughout the procedure under my direct supervision. CONTRAST:  None FLUOROSCOPY TIME:  CT dose; 334 mGycm COMPLICATIONS: None immediate. PROCEDURE: RADIATION DOSE REDUCTION: This exam was performed according to the departmental dose-optimization program which includes automated exposure control, adjustment of the mA and/or kV according to patient size and/or use of iterative reconstruction technique. Informed written consent was obtained from the patient after Bernis Schreur discussion of the risks, benefits and alternatives to treatment. The patient was placed supine on the CT gantry and Jontez Redfield pre procedural CT was performed  re-demonstrating the known abscess/fluid collection within the gallbladder fossa. The procedure was planned. Jaileigh Weimer timeout was performed prior to the initiation of the procedure. The RIGHT upper quadrant was prepped and draped in the usual sterile fashion. The overlying soft tissues were anesthetized with 1% lidocaine  with epinephrine . Appropriate trajectory was planned with the use of Erline Siddoway 22 gauge spinal needle. An 18 gauge trocar needle was advanced into the abscess/fluid collection and Verdene Creson short Amplatz super stiff wire was coiled within the collection. Appropriate positioning was confirmed with Darl Brisbin limited CT scan. The tract was serially dilated allowing placement of Arlinda Barcelona 10 Fr drainage catheter. Appropriate positioning was confirmed  with Nain Rudd limited postprocedural CT scan. 5 mL of purulent fluid was aspirated. The tube was connected to bulb suction and sutured in place. Adamarie Izzo dressing was placed. The patient tolerated the procedure well without immediate post procedural complication. IMPRESSION: Successful CT-guided placement of Kento Gossman 10 Fr drainage catheter into the gallbladder fossa with aspiration of 5 mL of purulent fluid. Samples were sent to the laboratory as requested by the ordering clinical team. RECOMMENDATIONS: The patient will return to Vascular Interventional Radiology (VIR) for routine drainage catheter evaluation and exchange in 7 days. Thom Hall, MD [facility] Radiology Electronically Signed   By: Thom Hall M.D.   On: 05/23/2024 15:30   DG Chest Port 1 View Result Date: 05/22/2024 CLINICAL DATA:  Questionable sepsis - evaluate for abnormality EXAM: PORTABLE CHEST - 1 VIEW COMPARISON:  May 19, 2024 FINDINGS: Low lung volumes with streaky atelectasis or scarring in the right lung base. No focal airspace consolidation, pleural effusion, or pneumothorax. No cardiomegaly. Tortuous aorta. No acute fracture or destructive lesion. Multilevel thoracic osteophytosis.  IMPRESSION: No acute cardiopulmonary abnormality. Electronically Signed   By: Rogelia Myers M.D.   On: 05/22/2024 18:52        Scheduled Meds:  amLODipine   10 mg Oral Daily   And   benazepril   20 mg Oral Daily   insulin  aspart  0-9 Units Subcutaneous TID WC   insulin  glargine-yfgn  10 Units Subcutaneous Daily   pantoprazole   20 mg Oral Daily   sodium chloride  flush  3 mL Intravenous Q12H   sodium chloride  flush  5 mL Intracatheter Q8H   Continuous Infusions:  piperacillin -tazobactam (ZOSYN )  IV 3.375 g (05/24/24 1404)     LOS: 1 day    Time spent: over 30 min     Meliton Monte, MD [facility]   To contact the attending provider between 7A-7P or the covering provider during after hours 7P-7A, please log into the web site [email or web address] and access using universal [place] password for that web site. If you do not have the password, please call the hospital operator.  05/24/2024, 2:25 PM

## 2024-05-24 NOTE — Assessment & Plan Note (Signed)
 Lab Results  Component Value Date   HGBA1C 8.2 (H) 05/18/2024   uncontrolled, pt to continue current medical treatment novolog  ssi, semglee  10 u every day as declines other change today

## 2024-05-24 NOTE — Progress Notes (Signed)
 This nurse educated wife on how to empty and flush the drain. Pt was able to speak to how to flush the drain as well. Will endorse to ongoing nurse that wife has been educated on drain care.

## 2024-05-24 NOTE — Assessment & Plan Note (Addendum)
 Pt acutely ill now 2 wks s/p CCX  - have high suspicion for RUQ abscess - for CT abd pelvis w cm asap, hdyrocodone 5 325 prn

## 2024-05-24 NOTE — Assessment & Plan Note (Signed)
 Last vitamin D  Lab Results  Component Value Date   VD25OH 15.85 (L) 05/18/2024   Low, to start oral replacement

## 2024-05-24 NOTE — Assessment & Plan Note (Signed)
 Most likely due to ongoing RUQ process - declines inhaler for now

## 2024-05-25 ENCOUNTER — Other Ambulatory Visit (HOSPITAL_COMMUNITY): Payer: Self-pay

## 2024-05-25 DIAGNOSIS — K651 Peritoneal abscess: Secondary | ICD-10-CM | POA: Diagnosis not present

## 2024-05-25 DIAGNOSIS — T8143XA Infection following a procedure, organ and space surgical site, initial encounter: Secondary | ICD-10-CM | POA: Diagnosis not present

## 2024-05-25 LAB — COMPREHENSIVE METABOLIC PANEL WITH GFR
ALT: 60 U/L — ABNORMAL HIGH (ref 0–44)
AST: 46 U/L — ABNORMAL HIGH (ref 15–41)
Albumin: 2.3 g/dL — ABNORMAL LOW (ref 3.5–5.0)
Alkaline Phosphatase: 151 U/L — ABNORMAL HIGH (ref 38–126)
Anion gap: 12 (ref 5–15)
BUN: 7 mg/dL — ABNORMAL LOW (ref 8–23)
CO2: 25 mmol/L (ref 22–32)
Calcium: 8.9 mg/dL (ref 8.9–10.3)
Chloride: 101 mmol/L (ref 98–111)
Creatinine, Ser: 0.94 mg/dL (ref 0.61–1.24)
GFR, Estimated: >60 mL/min
Glucose, Bld: 126 mg/dL — ABNORMAL HIGH (ref 70–99)
Potassium: 3.4 mmol/L — ABNORMAL LOW (ref 3.5–5.1)
Sodium: 138 mmol/L (ref 135–145)
Total Bilirubin: 0.9 mg/dL (ref 0.0–1.2)
Total Protein: 7.1 g/dL (ref 6.5–8.1)

## 2024-05-25 LAB — CBC
HCT: 35.7 % — ABNORMAL LOW (ref 39.0–52.0)
Hemoglobin: 11.7 g/dL — ABNORMAL LOW (ref 13.0–17.0)
MCH: 31 pg (ref 26.0–34.0)
MCHC: 32.8 g/dL (ref 30.0–36.0)
MCV: 94.4 fL (ref 80.0–100.0)
Platelets: 459 10*3/uL — ABNORMAL HIGH (ref 150–400)
RBC: 3.78 MIL/uL — ABNORMAL LOW (ref 4.22–5.81)
RDW: 12.6 % (ref 11.5–15.5)
WBC: 11 10*3/uL — ABNORMAL HIGH (ref 4.0–10.5)
nRBC: 0 % (ref 0.0–0.2)

## 2024-05-25 LAB — GLUCOSE, CAPILLARY
Glucose-Capillary: 134 mg/dL — ABNORMAL HIGH (ref 70–99)
Glucose-Capillary: 142 mg/dL — ABNORMAL HIGH (ref 70–99)
Glucose-Capillary: 159 mg/dL — ABNORMAL HIGH (ref 70–99)
Glucose-Capillary: 164 mg/dL — ABNORMAL HIGH (ref 70–99)
Glucose-Capillary: 190 mg/dL — ABNORMAL HIGH (ref 70–99)

## 2024-05-25 MED ORDER — SODIUM CHLORIDE FLUSH 0.9 % IV SOLN
10.0000 mL | INTRAVENOUS | 0 refills | Status: DC | PRN
Start: 2024-05-25 — End: 2024-05-25

## 2024-05-25 MED ORDER — SODIUM CHLORIDE FLUSH 0.9 % IV SOLN
10.0000 mL | INTRAVENOUS | 0 refills | Status: DC | PRN
  Filled 2024-05-25: qty 300, 30d supply, fill #0

## 2024-05-25 NOTE — Plan of Care (Signed)
   Problem: Clinical Measurements: Goal: Will remain free from infection Outcome: Progressing   Problem: Pain Managment: Goal: General experience of comfort will improve and/or be controlled Outcome: Progressing   Problem: Safety: Goal: Ability to remain free from injury will improve Outcome: Progressing

## 2024-05-25 NOTE — Progress Notes (Signed)
 Referring Provider(s): Dr. Krystal Spinner  Supervising Physician: Luverne Aran  Patient Status:  [facility] - In-pt  Chief Complaint: Gallbladder fossa fluid collection s/p drain placement on 6/28 seen for follow up   Brief History:  73 year old male  with a recent history of gangrenous cholecystitis s/p cholecystectomy on 04/28/24 with Dr. DELENA Leos. Patient re-presented to the ED on 6/27 with complaints of abdominal pain. In ED patient was febrile with leukocytosis and elevated LFTs. CT A/P revealed abscess in the cholecystectomy bed. Patient was started on IV antibiotics and IR was consulted for drain placement. Patient underwent gallbladder fossa abscess drain placement on 6/28 with Dr. JINNY Hall.    Subjective:  Patient resting comfortably in bed with family at the bedside. States that he is likely going to be discharged in the next two days.   Allergies: Metformin  and related  Medications: Prior to Admission medications   Medication Sig Start Date End Date Taking? Authorizing Provider  amLODipine -benazepril  (LOTREL) 10-20 MG capsule Take 1 capsule by mouth daily.   Yes [provider]  Cholecalciferol  (VITAMIN D -3 PO) Take 1 capsule by mouth daily.   Yes [provider]  cyclobenzaprine  (FLEXERIL ) 10 MG tablet Take 1 tablet (10 mg total) by mouth at bedtime. 05/19/24  Yes Sagardia, Emil Schanz, MD  famotidine  (PEPCID ) 20 MG tablet Take 1 tablet (20 mg total) by mouth 2 (two) times daily. Patient taking differently: Take 20 mg by mouth 2 (two) times daily as needed for heartburn. 04/25/24  Yes Flint Sonny POUR, PA-C  glipiZIDE  (GLUCOTROL  XL) 10 MG 24 hr tablet Take 1 tablet (10 mg total) by mouth every morning. 02/26/24  Yes Norleen Lynwood ORN, MD  HYDROcodone -acetaminophen  (NORCO/VICODIN) 5-325 MG tablet Take 1-2 tablets by mouth every 6 (six) hours as needed for moderate pain (pain score 4-6). 04/29/24  Yes Vicci Burnard SAUNDERS, PA-C  ibuprofen (ADVIL) 200 MG tablet Take 400 mg by  mouth 2 (two) times daily as needed for headache or moderate pain (pain score 4-6).   Yes [provider]  insulin  glargine (LANTUS  SOLOSTAR) 100 UNIT/ML Solostar Pen Inject 30 Units into the skin daily. E11.9 05/18/24  Yes Norleen Lynwood ORN, MD  pantoprazole  (PROTONIX ) 20 MG tablet Take 1 tablet (20 mg total) by mouth daily. Patient taking differently: Take 20 mg by mouth daily as needed for heartburn. 04/25/24  Yes [name], [name], PA-C  pioglitazone  (ACTOS ) 45 MG tablet Take 1 tablet (45 mg total) by mouth daily. 02/26/24  Yes Norleen Lynwood ORN, MD  rosuvastatin  (CRESTOR ) 20 MG tablet Take 1 tablet (20 mg total) by mouth daily. Patient taking differently: Take 20 mg by mouth at bedtime. 02/26/24  Yes Norleen Lynwood ORN, MD  polyethylene glycol (MIRALAX  / GLYCOLAX ) 17 g packet Take 17 g by mouth daily as needed. Patient not taking: Reported on 05/23/2024 04/29/24   Vicci Burnard SAUNDERS, PA-C  sodium chloride  flush 0.9 % SOLN injection Flush catheter with 5-10cc saline daily to prevent clogging. 05/25/24   Cartez Mogle M, PA-C    Vital Signs: BP 136/83 (BP Location: Left Arm)   Pulse 84   Temp 98.3 F (36.8 C) (Oral)   Resp 18   Ht 5' 10 (1.778 m)   Wt 171 lb 1.2 oz (77.6 kg)   SpO2 95%   BMI 24.55 kg/m   Physical Exam Vitals reviewed.  Constitutional:      Appearance: Normal appearance.   Cardiovascular:     Rate and Rhythm: Normal rate.  Pulmonary:     Effort: Pulmonary effort is normal.  Abdominal:     General: Abdomen is flat.     Palpations: Abdomen is soft.     Tenderness: There is no abdominal tenderness.     Comments: RUQ drain sutured and stat-locked in place. Insertion site is non-erythematous. Overlying bandage is clean and dry. ~5 cc serosanguinous output in bulb to suction. Flushes/aspirates easily   Skin:    General: Skin is warm.   Neurological:     Mental Status: He is alert and oriented to person, place, and time.    Drain Location: RUQ Size: Fr size: 10 Fr Date  of placement: 05/23/24  Currently to: Drain collection device: suction bulb 24 hour output:  Output by Drain (mL) 05/23/24 0701 - 05/23/24 1900 05/23/24 1901 - 05/24/24 0700 05/24/24 0701 - 05/24/24 1900 05/24/24 1901 - 05/25/24 0700 05/25/24 0701 - 05/25/24 1307  Closed System Drain 1 Lateral RLQ Bulb (JP) 10 Fr. 60 40 25 20     Interval imaging/drain manipulation:  None  Current examination: Flushes/aspirates easily.  Insertion site unremarkable. Suture and stat lock in place. Dressed appropriately.   Labs:  CBC: Recent Labs    05/22/24 1905 05/23/24 0856 05/24/24 0934 05/25/24 0700  WBC 15.2* 14.7* 11.3* 11.0*  HGB 12.4* 11.7* 12.0* 11.7*  HCT 37.4* 35.5* 36.6* 35.7*  PLT 521* 502* 491* 459*    COAGS: Recent Labs    05/22/24 1905  INR 1.2    BMP: Recent Labs    05/22/24 1905 05/23/24 0856 05/24/24 0934 05/25/24 0700  NA 138 139 138 138  K 3.9 3.4* 3.6 3.4*  CL 101 104 101 101  CO2 24 22 23 25   GLUCOSE 154* 100* 99 126*  BUN 18 11 9  7*  CALCIUM  10.1 8.9 9.2 8.9  CREATININE 1.13 0.98 0.95 0.94  GFRNONAA >60 >60 >60 >60    LIVER FUNCTION TESTS: Recent Labs    05/22/24 1905 05/23/24 0856 05/24/24 0934 05/25/24 0700  BILITOT 0.6 1.2 1.1 0.9  AST 110* 82* 73* 46*  ALT 127* 87* 81* 60*  ALKPHOS 279* 182* 175* 151*  PROT 8.5* 7.2 7.1 7.1  ALBUMIN 3.6 2.4* 2.3* 2.3*    Assessment and Plan:  Gallbladder Fossa Abscess: Benjamin Patton is a 73 y.o. male with a recent history of gangrenous cholecystitis s/p cholecystectomy who developed a fluid collection in his gallbladder fossa. Patient underwent subsequent RUQ drain placement in IR with Dr. JINNY Hall on 6/28.  -Leukocytosis and LFTs continue to trend down -Continue IV antibiotics per primary team -Expected discharge in the next 1-2 days per patient and surgery -Patient educated on at home drain care; patient and family verbalize understanding and state they feel comfortable managing this until  follow up -IR follow up order placed; IR scheduler will contact patient with date/time of appointment. Patient will need to flush drain QD with 5 cc NS, record output QD, dressing changes every 2-3 days or earlier if soiled. Instructions in AVS   While Inpatient: Continue TID flushes with 5 cc NS. Record output Q shift. Dressing changes QD or PRN if soiled.  Call IR APP or on call IR MD if difficulty flushing or sudden change in drain output.  Repeat imaging/possible drain injection once output < 10 mL/QD (excluding flush material). Consideration for drain removal if output is < 10 mL/QD (excluding flush material), pending discussion with the providing surgical service.  IR will continue to follow - please call with  questions or concerns.   Thank you for allowing our service to participate in MIKEY MAFFETT 's care.   Electronically Signed: Davinci Glotfelty M Othello Dickenson, PA-C 05/25/2024, 12:48 PM    I spent a total of 15 Minutes at the the patient's bedside AND on the patient's hospital floor or unit, greater than 50% of which was counseling/coordinating care for drain care follow up.

## 2024-05-25 NOTE — Progress Notes (Signed)
 PROGRESS NOTE    Benjamin Patton  FMW:[id number] DOB: [birth year] DOA: 05/22/2024 PCP: Norleen Lynwood LELON, MD  Chief Complaint  Patient presents with   Abdominal Pain   Abscess    Intra-abdominal    Brief Narrative:   Benjamin Patton is Benjamin Patton 73 y.o. male with medical history significant for hypertension, hyperlipidemia, diabetes mellitus, and gangrenous cholecystitis status post laparoscopic cholecystectomy with intraoperative cholangiogram on 04/28/2024 who now returns with RUQ soreness, sweats, and fatigue.   Found to have intraabdominal abscess.   Assessment & Plan:   Principal Problem:   Postprocedural intraabdominal abscess (HCC) Active Problems:   Diabetes (HCC)   HYPERCHOLESTEROLEMIA   Essential hypertension   Elevated transaminase level  1. Intraabdominal abscess  - Hx of gangrenous cholecystitis s/p cholecystectomy with IOC on 6/3 - CT 6/28 with 6.4x7.3x6.4 cm abscess/complex collection in the cholecystectomy bed - now s/p CT guided drain placement by IR - IR recommending repeat imaging/possible drain injection when output less than 10 cc/day - follow drain culture NG - follow blood cultures NGx3 - Continue Zosyn  - appreciate surgery recommendations - consider d/c 7/1  # Shortness of Breath - suspect splinting related to above process  - echo - CT PE protocol without PE   2. Hypertension  - Continue amlodipine  and ACE-i    3. Elevated transaminases  - No biliary dilatation on CT in ED  - improving, trend    4. Type II DM  - A1c was 8.2% in June 2025  - Check CBGs, continue long-acting insulin  with dose-reduction while NPO, add sliding-scale correctional insulin  for now      DVT prophylaxis: SCD Code Status: full Family Communication: daughter at bedside Disposition:   Status is: Inpatient Remains inpatient appropriate because: need for ongoing care   Consultants:  none  Procedures:  Successful CT-guided placement of 10 F catheter into GB fossa  abscess.   Plan:  - Flush drain with 5 mL Normal Saline every 8 hours. - Follow up drain evaluation / sinogram in 7 day(s).  Antimicrobials:  Anti-infectives (From admission, onward)    Start     Dose/Rate Route Frequency Ordered Stop   05/23/24 0600  piperacillin -tazobactam (ZOSYN ) IVPB 3.375 g        3.375 g 12.5 mL/hr over 240 Minutes Intravenous Every 8 hours 05/23/24 0445     05/22/24 2000  piperacillin -tazobactam (ZOSYN ) IVPB 3.375 g        3.375 g 100 mL/hr over 30 Minutes Intravenous  Once 05/22/24 1959 05/22/24 2052       Subjective: Continued pleuritic pain   Objective: Vitals:   05/24/24 2000 05/25/24 0600 05/25/24 0801 05/25/24 1620  BP: 122/80 121/83 136/83 127/81  Pulse: 74 84 84 80  Resp: 18 18 18 18   Temp: 98.9 F (37.2 C) 98.9 F (37.2 C) 98.3 F (36.8 C)   TempSrc: Oral Oral Oral   SpO2: 97% 91% 95% 95%  Weight:      Height:        Intake/Output Summary (Last 24 hours) at 05/25/2024 1951 Last data filed at 05/25/2024 1326 Gross per 24 hour  Intake 359 ml  Output 25 ml  Net 334 ml   Filed Weights   05/23/24 0400  Weight: 77.6 kg    Examination:  General: No acute distress. Cardiovascular: RRR Lungs: unlabored Abdomen: Soft, nontender, nondistended  Neurological: Alert and oriented 3. Moves all extremities 4 with equal strength. Cranial nerves II through XII grossly intact.. Extremities: No clubbing or  cyanosis. No edema.  Data Reviewed: I have personally reviewed following labs and imaging studies  CBC: Recent Labs  Lab 05/22/24 1905 05/23/24 0856 05/24/24 0934 05/25/24 0700  WBC 15.2* 14.7* 11.3* 11.0*  NEUTROABS 10.0*  --   --   --   HGB 12.4* 11.7* 12.0* 11.7*  HCT 37.4* 35.5* 36.6* 35.7*  MCV 94.9 96.2 95.8 94.4  PLT 521* 502* 491* 459*    Basic Metabolic Panel: Recent Labs  Lab 05/22/24 1905 05/23/24 0856 05/24/24 0934 05/25/24 0700  NA 138 139 138 138  K 3.9 3.4* 3.6 3.4*  CL 101 104 101 101  CO2 24 22 23 25    GLUCOSE 154* 100* 99 126*  BUN 18 11 9  7*  CREATININE 1.13 0.98 0.95 0.94  CALCIUM  10.1 8.9 9.2 8.9    GFR: Estimated Creatinine Clearance: 74.4 mL/min (by C-G formula based on SCr of 0.94 mg/dL).  Liver Function Tests: Recent Labs  Lab 05/22/24 1905 05/23/24 0856 05/24/24 0934 05/25/24 0700  AST 110* 82* 73* 46*  ALT 127* 87* 81* 60*  ALKPHOS 279* 182* 175* 151*  BILITOT 0.6 1.2 1.1 0.9  PROT 8.5* 7.2 7.1 7.1  ALBUMIN 3.6 2.4* 2.3* 2.3*    CBG: Recent Labs  Lab 05/24/24 1743 05/25/24 0012 05/25/24 0850 05/25/24 1241 05/25/24 1618  GLUCAP 149* 134* 190* 159* 164*     Recent Results (from the past 240 hours)  Urine Culture     Status: None   Collection Time: 05/19/24  2:31 PM   Specimen: Urine  Result Value Ref Range Status   Source: URINE  Final   Status: FINAL  Final   Result: No Growth  Final  Blood Culture (routine x 2)     Status: None (Preliminary result)   Collection Time: 05/22/24  7:25 PM   Specimen: BLOOD  Result Value Ref Range Status   Specimen Description   Final    BLOOD RIGHT ANTECUBITAL Performed at [facility], [address], [place], [place] [address]    Special Requests   Final    BOTTLES DRAWN AEROBIC AND ANAEROBIC Blood Culture adequate volume Performed at [facility], [address] [place], [place] [address]    Culture   Final    NO GROWTH 3 DAYS Performed at [facility], [address], [place], [place] [address]    Report Status PENDING  Incomplete  Blood Culture (routine x 2)     Status: None (Preliminary result)   Collection Time: 05/22/24  7:28 PM   Specimen: BLOOD  Result Value Ref Range Status   Specimen Description   Final    BLOOD LEFT ANTECUBITAL Performed at [facility], [address], [place], [place] [address]    Special Requests   Final    BOTTLES DRAWN AEROBIC AND ANAEROBIC Blood Culture adequate volume Performed at [facility], [address], [place], [place] [address]    Culture   Final    NO GROWTH 3 DAYS Performed at [facility], [address], [place], [place] [address]    Report Status PENDING  Incomplete  Aerobic/Anaerobic Culture w Gram Stain (surgical/deep wound)     Status: None (Preliminary result)   Collection Time: 05/23/24 11:56 AM   Specimen: Abscess  Result Value Ref Range Status   Specimen Description ABSCESS  Final   Special Requests ABSCESS  Final   Gram Stain   Final    MODERATE WBC PRESENT,BOTH PMN  AND MONONUCLEAR NO ORGANISMS SEEN    Culture   Final    NO GROWTH 2 DAYS Performed at [facility], [address], [place], [place] [address]    Report Status PENDING  Incomplete         Radiology Studies: ECHOCARDIOGRAM COMPLETE Result Date: 05/24/2024    ECHOCARDIOGRAM REPORT   Patient Name:   JAKOLBY SEDIVY Date of Exam: 05/24/2024 Medical Rec #:  [id number]        Height:       70.0 in Accession #:    [id number]       Weight:       171.1 lb Date of Birth:  [birth year]       BSA:          1.953 m Patient Age:    72 years         BP:           112/75 mmHg Patient Gender: M                HR:           105 bpm. Exam Location:  Inpatient Procedure: 2D Echo, Cardiac Doppler and Color Doppler (Both Spectral and Color            Flow Doppler were utilized during procedure). Indications:    R01.1 Murmur  History:        Patient has prior history of Echocardiogram examinations, most                 recent 04/13/2015. Signs/Symptoms:Chest Pain and Murmur; Risk                 Factors:Hypertension, Diabetes and Dyslipidemia. Intraabdominal                 abscess.  Sonographer:    Ellouise Mose RDCS Referring Phys: [phone] Sianna Garofano CALDWELL POWELL JR IMPRESSIONS  1. Left ventricular ejection fraction, by estimation, is 60 to 65%. The left ventricle has normal function. The left ventricle has no regional wall motion abnormalities. Left ventricular diastolic parameters are consistent  with Grade I diastolic dysfunction (impaired relaxation).  2. Right ventricular systolic function is normal. The right ventricular size is normal. Tricuspid regurgitation signal is inadequate for assessing PA pressure.  3. The mitral valve is normal in structure. Trivial mitral valve regurgitation. No evidence of mitral stenosis.  4. The aortic valve is tricuspid. Aortic valve regurgitation is not visualized. No aortic stenosis is present. FINDINGS  Left Ventricle: Left ventricular ejection fraction, by estimation, is 60 to 65%. The left ventricle has normal function. The left ventricle has no regional wall motion abnormalities. The left ventricular internal cavity size was normal in size. There is  no left ventricular hypertrophy. Left ventricular diastolic parameters are consistent with Grade I diastolic dysfunction (impaired relaxation). Right Ventricle: The right ventricular size is normal. No increase in right ventricular wall thickness. Right ventricular systolic function is normal. Tricuspid regurgitation signal is inadequate for assessing PA pressure. Left Atrium: Left atrial size was normal in size. Right Atrium: Right atrial size was normal in size. Pericardium: There is no evidence of pericardial effusion. Mitral Valve: The mitral valve is normal in structure. Trivial mitral valve regurgitation. No evidence of mitral valve stenosis. Tricuspid Valve: The tricuspid valve is normal in structure. Tricuspid valve regurgitation is trivial. No evidence of tricuspid stenosis. Aortic Valve: The aortic valve is tricuspid. Aortic valve regurgitation is not visualized. No aortic stenosis is  present. Pulmonic Valve: The pulmonic valve was normal in structure. Pulmonic valve regurgitation is trivial. No evidence of pulmonic stenosis. Aorta: The aortic root and ascending aorta are structurally normal, with no evidence of dilitation. IAS/Shunts: The interatrial septum appears to be lipomatous. No atrial level shunt  detected by color flow Doppler.  LEFT VENTRICLE PLAX 2D LVIDd:         4.50 cm     Diastology LVIDs:         3.10 cm     LV e' medial:    5.98 cm/s LV PW:         1.00 cm     LV E/e' medial:  8.6 LV IVS:        1.00 cm     LV e' lateral:   8.49 cm/s LVOT diam:     2.30 cm     LV E/e' lateral: 6.0 LV SV:         82 LV SV Index:   42 LVOT Area:     4.15 cm  LV Volumes (MOD) LV vol d, MOD A2C: 73.1 ml LV vol d, MOD A4C: 75.3 ml LV vol s, MOD A2C: 22.3 ml LV vol s, MOD A4C: 33.0 ml LV SV MOD A2C:     50.8 ml LV SV MOD A4C:     75.3 ml LV SV MOD BP:      48.5 ml RIGHT VENTRICLE             IVC RV S prime:     14.80 cm/s  IVC diam: 1.50 cm TAPSE (M-mode): 1.6 cm LEFT ATRIUM             Index        RIGHT ATRIUM          Index LA Vol (A2C):   16.1 ml 8.24 ml/m   RA Area:     6.79 cm LA Vol (A4C):   28.7 ml 14.69 ml/m  RA Volume:   8.49 ml  4.35 ml/m LA Biplane Vol: 21.3 ml 10.90 ml/m  AORTIC VALVE LVOT Vmax:   126.00 cm/s LVOT Vmean:  79.700 cm/s LVOT VTI:    0.198 m  AORTA Ao Root diam: 3.50 cm Ao Asc diam:  3.70 cm MITRAL VALVE MV Area (PHT): 4.13 cm    SHUNTS MV Decel Time: 184 msec    Systemic VTI:  0.20 m MV E velocity: 51.20 cm/s  Systemic Diam: 2.30 cm MV Lorimer Tiberio velocity: 80.70 cm/s MV E/Cameran Pettey ratio:  0.63 Dorn Ross MD Electronically signed by Dorn Ross MD Signature Date/Time: 05/24/2024/3:49:57 PM    Final         Scheduled Meds:  amLODipine   10 mg Oral Daily   And   benazepril   20 mg Oral Daily   insulin  aspart  0-9 Units Subcutaneous TID WC   insulin  glargine-yfgn  10 Units Subcutaneous Daily   pantoprazole   20 mg Oral Daily   sodium chloride  flush  3 mL Intravenous Q12H   sodium chloride  flush  5 mL Intracatheter Q8H   Continuous Infusions:  piperacillin -tazobactam (ZOSYN )  IV 3.375 g (05/25/24 1326)     LOS: 2 days    Time spent: over 30 min     Meliton Monte, MD [facility]   To contact the attending provider between 7A-7P or the covering provider during after  hours 7P-7A, please log into the web site [email or web address] and access using universal [place] password for that web site. If  you do not have the password, please call the hospital operator.  05/25/2024, 7:51 PM

## 2024-05-25 NOTE — Progress Notes (Signed)
   05/25/24 0920  TOC Brief Assessment  Insurance and Status Reviewed  Patient has primary care physician Yes  Home environment has been reviewed spouse  Prior level of function: independent  Prior/Current Home Services No current home services  Social Drivers of Health Review SDOH reviewed no interventions necessary  Readmission risk has been reviewed Yes  Transition of care needs no transition of care needs at this time     05/25/24 0920  TOC Brief Assessment  Insurance and Status Reviewed  Patient has primary care physician Yes  Home environment has been reviewed spouse  Prior level of function: independent  Prior/Current Home Services No current home services  Social Drivers of Health Review SDOH reviewed no interventions necessary  Readmission risk has been reviewed Yes  Transition of care needs no transition of care needs at this time   Nurse provided drain education.     Transition of Care Department [facility]) has reviewed patient and no TOC needs have been identified at this time. We will continue to monitor patient advancement through interdisciplinary progression rounds. If new patient transition needs arise, please place a TOC consult.

## 2024-05-25 NOTE — Plan of Care (Signed)

## 2024-05-25 NOTE — Discharge Instructions (Addendum)
 Interventional Radiology Percutaneous Abscess Drain Placement After Care   This sheet gives you information about how to care for yourself after your procedure. Your health care provider may also give you more specific instructions. Your drain was placed by an interventional radiologist with [facility]. If you have questions or concerns, contact [facility] at [phone].   What is a percutaneous drain?   A drain is a small plastic tube (catheter) that goes into the fluid collection in your body through your skin.   How long will I need the drain?   How long the drain needs to stay in is determined by where the drain is, how much comes out of the drain each day and if you are having any other surgical procedures.   Interventional radiology will determine when it is time to remove the drain. It is important to follow up as directed so that the drain can be removed as soon as it is safe to do so.   What can I expect after the procedure?   After the procedure, it is common to have:   A small amount of bruising and discomfort in the area where the drainage tube (catheter) was placed.   Sleepiness and fatigue. This should go away after the medicines you were given have worn off.   Follow these instructions at home:   Insertion site care   Check your insertion site when you change the bandage. Check for:   More redness, swelling, or pain.   More fluid or blood.   Warmth.   Pus or a bad smell.   When caring for your insertion site:   Wash your hands with soap and water for at least 20 seconds before and after you change your bandage (dressing). If soap and water are not available, use hand sanitizer.   You do not need to change your dressing everyday if it is clean and dry. Change your dressing every 3 days or as needed when it is soiled, wet or becoming dislodged. You will need to change your dressing each time you shower.   Leave stitches (sutures), skin  glue, or adhesive strips in place. These skin closures may need to stay in place for 2 weeks or longer. If adhesive strip edges start to loosen and curl up, you may trim the loose edges. Do not remove adhesive strips completely unless your health care provider tells you to do so.   Catheter care   Flush the catheter once per day with 5 mL of 0.9% normal saline unless you are told otherwise by your healthcare provider. This helps to prevent clogs in the catheter.   To disconnect the drain, turn the clear plastic tube to the left. Attach the saline syringe by placing it on the white end of the drain and turning gently to the right. Once attached gently push the plunger to the 5 mL mark. After you are done flushing, disconnect the syringe by turning to the left and reattach your drainage container   If you have a bulb please be sure the bulb is charged after reconnecting it - to do this pinch the bulb between your thumb and first finger and close the stopper located on the top of the bulb.    Check for fluid leaking from around your catheter (instead of fluid draining through your catheter). This may be a sign that the drain is no longer working correctly.   Write down the following information every time you empty your  bag:   The date and time.   The amount of drainage.   Activity   Rest at home for 1-2 days after your procedure.   For the first 48 hours do not lift anything more than 10 lbs (about a gallon of milk). You may perform moderate activities/exercise. Please avoid strenuous activities during this time.   Avoid any activities which may pull on your drain as this can cause your drain to become dislodged.   If you were given a sedative during the procedure, it can affect you for several hours. Do not drive or operate machinery until your health care provider says that it is safe.   General instructions   For mild pain take over-the-counter medications as needed for pain such as  Tylenol or Advil. If you are experiencing severe pain please call our office as this may indicate an issue with your drain.    If you were prescribed an antibiotic medicine, take it as told by your health care provider. Do not stop using the antibiotic even if you start to feel better.   You may shower 24 hours after the drain is placed. To do this cover the insertion site with a water tight material such as saran wrap and seal the edges with tape, you may also purchase waterproof dressings at your local drug store. Shower as usual and then remove the water tight dressing and any gauze/tape underneath it once you have exited the shower and dried off. Allow the area to air dry or pat dry with a clean towel. Once the skin is completely dry place a new gauze dressing. It is important to keep the site dry at all times to prevent infection.   Do not submerge the drain - this means you cannot take baths, swim, use a hot tub, etc. until the drain is removed.    Do not use any products that contain nicotine or tobacco, such as cigarettes, e-cigarettes, and chewing tobacco. If you need help quitting, ask your health care provider.   Keep all follow-up visits as told by your health care provider. This is important.   Contact a health care provider if:   You have less than 10 mL of drainage a day for 2-3 days in a row, or as directed by your health care provider.   You have any of these signs of infection:   More redness, swelling, or pain around your incision area.   More fluid or blood coming from your incision area.   Warmth coming from your incision area.   Pus or a bad smell coming from your incision area.   You have fluid leaking from around your catheter (instead of through your catheter).   You are unable to flush the drain.   You have a fever or chills.   You have pain that does not get better with medicine.   You have not been contacted to schedule a drain follow up appointment  within 10 days of discharge from the hospital.   Please call [facility] at [phone] with any questions or concerns.   Get help right away if:   Your catheter comes out.   You suddenly stop having drainage from your catheter.   You suddenly have blood in the fluid that is draining from your catheter.   You become dizzy or you faint.   You develop a rash.   You have nausea or vomiting.   You have difficulty breathing or you feel short  of breath.   You develop chest pain.   You have problems with your speech or vision.   You have trouble balancing or moving your arms or legs.   Summary   It is common to have a small amount of bruising and discomfort in the area where the drainage tube (catheter) was placed. You may also have minor discomfort with movement while the drain is in place.   Flush the drain once per day with 5 mL of 0.9% normal saline (unless you were told otherwise by your healthcare provider).    Record the amount of drainage from the bag every time you empty it.   Change the dressing every 3 days or earlier if soiled/wet. Keep the skin dry under the dressing.   You may shower with the drain in place. Do not submerge the drain (no baths, swimming, hot tubs, etc.).   Contact [facility] at [phone] if you have more redness, swelling, or pain around your incision area or if you have pain that does not get better with medicine.   This information is not intended to replace advice given to you by your health care provider. Make sure you discuss any questions you have with your health care provider.   Document Revised: 02/15/2022 Document Reviewed: 11/07/2019   Elsevier Patient Education  2023 [employer]         Interventional Radiology Drain Record   Empty your drain at least once per day. You may empty it as often as needed. Use this form to write down the amount of fluid that has collected in the drainage container. Bring  this form with you to your follow-up visits. Please call [facility] at [phone] with any questions or concerns prior to your appointment.   Drain #1 location: ___________________   Date __________ Time __________ Amount __________   Date __________ Time __________ Amount __________   Date __________ Time __________ Amount __________   Date __________ Time __________ Amount __________   Date __________ Time __________ Amount __________   Date __________ Time __________ Amount __________   Date __________ Time __________ Amount __________   Date __________ Time __________ Amount __________   Date __________ Time __________ Amount __________   Date __________ Time __________ Amount __________   Date __________ Time __________ Amount __________   Date __________ Time __________ Amount __________   Date __________ Time __________ Amount __________   Date __________ Time __________ Amount __________

## 2024-05-25 NOTE — Progress Notes (Signed)
 Chief Complaint: Subhepatic abscess following cholecystectomy  Subjective: Patient has c/o mild pain in LUQ with deep breaths, otherwise pain is well managed. Patient not in acute distress. Pt remain HDS. No c/o N/V,denies BM but endorses flatus.   Objective: Vital signs in last 24 hours: Temp:  [98.1 F (36.7 C)-98.9 F (37.2 C)] 98.3 F (36.8 C) (06/30 0801) Pulse Rate:  [74-86] 84 (06/30 0801) Resp:  [18] 18 (06/30 0801) BP: (111-136)/(75-83) 136/83 (06/30 0801) SpO2:  [91 %-99 %] 95 % (06/30 0801) Last BM Date : 05/21/24  Intake/Output from previous day: 06/29 0701 - 06/30 0700 In: 605 [P.O.:600] Out: 45 [Drains:45] Intake/Output this shift: Total I/O In: 118 [P.O.:118] Out: -   Physical Exam: HEENT - sclerae clear, mucous membranes moist Abdomen - soft, no mass, mild tenderness; drain remains with purulent output in bulb  Lab Results:  Recent Labs    05/24/24 0934 05/25/24 0700  WBC 11.3* 11.0*  HGB 12.0* 11.7*  HCT 36.6* 35.7*  PLT 491* 459*   BMET Recent Labs    05/24/24 0934 05/25/24 0700  NA 138 138  K 3.6 3.4*  CL 101 101  CO2 23 25  GLUCOSE 99 126*  BUN 9 7*  CREATININE 0.95 0.94  CALCIUM  9.2 8.9   PT/INR Recent Labs    05/22/24 1905  LABPROT 16.2*  INR 1.2   Comprehensive Metabolic Panel:    Component Value Date/Time   NA 138 05/25/2024 0700   NA 138 05/24/2024 0934   K 3.4 (L) 05/25/2024 0700   K 3.6 05/24/2024 0934   CL 101 05/25/2024 0700   CL 101 05/24/2024 0934   CO2 25 05/25/2024 0700   CO2 23 05/24/2024 0934   BUN 7 (L) 05/25/2024 0700   BUN 9 05/24/2024 0934   CREATININE 0.94 05/25/2024 0700   CREATININE 0.95 05/24/2024 0934   CREATININE 0.94 04/07/2015 1015   CREATININE 0.82 09/05/2013 1050   GLUCOSE 126 (H) 05/25/2024 0700   GLUCOSE 99 05/24/2024 0934   CALCIUM  8.9 05/25/2024 0700   CALCIUM  9.2 05/24/2024 0934   AST 46 (H) 05/25/2024 0700   AST 73 (H) 05/24/2024 0934   ALT 60 (H) 05/25/2024  0700   ALT 81 (H) 05/24/2024 0934   ALKPHOS 151 (H) 05/25/2024 0700   ALKPHOS 175 (H) 05/24/2024 0934   BILITOT 0.9 05/25/2024 0700   BILITOT 1.1 05/24/2024 0934   PROT 7.1 05/25/2024 0700   PROT 7.1 05/24/2024 0934   ALBUMIN 2.3 (L) 05/25/2024 0700   ALBUMIN 2.3 (L) 05/24/2024 0934    Studies/Results: ECHOCARDIOGRAM COMPLETE Result Date: 05/24/2024    ECHOCARDIOGRAM REPORT   Patient Name:   Benjamin Patton Date of Exam: 05/24/2024 Medical Rec #:  [id number]        Height:       70.0 in Accession #:    [id number]       Weight:       171.1 lb Date of Birth:  1951-05-07       BSA:          1.953 m Patient Age:    72 years         BP:           112/75 mmHg Patient Gender: M                HR:  105 bpm. Exam Location:  Inpatient Procedure: 2D Echo, Cardiac Doppler and Color Doppler (Both Spectral and Color            Flow Doppler were utilized during procedure). Indications:    R01.1 Murmur  History:        Patient has prior history of Echocardiogram examinations, most                 recent 04/13/2015. Signs/Symptoms:Chest Pain and Murmur; Risk                 Factors:Hypertension, Diabetes and Dyslipidemia. Intraabdominal                 abscess.  Sonographer:    Ellouise Mose RDCS Referring Phys: [phone] A CALDWELL POWELL JR IMPRESSIONS  1. Left ventricular ejection fraction, by estimation, is 60 to 65%. The left ventricle has normal function. The left ventricle has no regional wall motion abnormalities. Left ventricular diastolic parameters are consistent with Grade I diastolic dysfunction (impaired relaxation).  2. Right ventricular systolic function is normal. The right ventricular size is normal. Tricuspid regurgitation signal is inadequate for assessing PA pressure.  3. The mitral valve is normal in structure. Trivial mitral valve regurgitation. No evidence of mitral stenosis.  4. The aortic valve is tricuspid. Aortic valve regurgitation is not visualized. No aortic stenosis is present. FINDINGS   Left Ventricle: Left ventricular ejection fraction, by estimation, is 60 to 65%. The left ventricle has normal function. The left ventricle has no regional wall motion abnormalities. The left ventricular internal cavity size was normal in size. There is  no left ventricular hypertrophy. Left ventricular diastolic parameters are consistent with Grade I diastolic dysfunction (impaired relaxation). Right Ventricle: The right ventricular size is normal. No increase in right ventricular wall thickness. Right ventricular systolic function is normal. Tricuspid regurgitation signal is inadequate for assessing PA pressure. Left Atrium: Left atrial size was normal in size. Right Atrium: Right atrial size was normal in size. Pericardium: There is no evidence of pericardial effusion. Mitral Valve: The mitral valve is normal in structure. Trivial mitral valve regurgitation. No evidence of mitral valve stenosis. Tricuspid Valve: The tricuspid valve is normal in structure. Tricuspid valve regurgitation is trivial. No evidence of tricuspid stenosis. Aortic Valve: The aortic valve is tricuspid. Aortic valve regurgitation is not visualized. No aortic stenosis is present. Pulmonic Valve: The pulmonic valve was normal in structure. Pulmonic valve regurgitation is trivial. No evidence of pulmonic stenosis. Aorta: The aortic root and ascending aorta are structurally normal, with no evidence of dilitation. IAS/Shunts: The interatrial septum appears to be lipomatous. No atrial level shunt detected by color flow Doppler.  LEFT VENTRICLE PLAX 2D LVIDd:         4.50 cm     Diastology LVIDs:         3.10 cm     LV e' medial:    5.98 cm/s LV PW:         1.00 cm     LV E/e' medial:  8.6 LV IVS:        1.00 cm     LV e' lateral:   8.49 cm/s LVOT diam:     2.30 cm     LV E/e' lateral: 6.0 LV SV:         82 LV SV Index:   42 LVOT Area:     4.15 cm  LV Volumes (MOD) LV vol d, MOD A2C: 73.1 ml LV vol d, MOD  A4C: 75.3 ml LV vol s, MOD A2C: 22.3 ml  LV vol s, MOD A4C: 33.0 ml LV SV MOD A2C:     50.8 ml LV SV MOD A4C:     75.3 ml LV SV MOD BP:      48.5 ml RIGHT VENTRICLE             IVC RV S prime:     14.80 cm/s  IVC diam: 1.50 cm TAPSE (M-mode): 1.6 cm LEFT ATRIUM             Index        RIGHT ATRIUM          Index LA Vol (A2C):   16.1 ml 8.24 ml/m   RA Area:     6.79 cm LA Vol (A4C):   28.7 ml 14.69 ml/m  RA Volume:   8.49 ml  4.35 ml/m LA Biplane Vol: 21.3 ml 10.90 ml/m  AORTIC VALVE LVOT Vmax:   126.00 cm/s LVOT Vmean:  79.700 cm/s LVOT VTI:    0.198 m  AORTA Ao Root diam: 3.50 cm Ao Asc diam:  3.70 cm MITRAL VALVE MV Area (PHT): 4.13 cm    SHUNTS MV Decel Time: 184 msec    Systemic VTI:  0.20 m MV E velocity: 51.20 cm/s  Systemic Diam: 2.30 cm MV A velocity: 80.70 cm/s MV E/A ratio:  0.63 Dorn Ross MD Electronically signed by Dorn Ross MD Signature Date/Time: 05/24/2024/3:49:57 PM    Final    CT Angio Chest Pulmonary Embolism (PE) W or WO Contrast Result Date: 05/23/2024 CLINICAL DATA:  Shortness of breath EXAM: CT ANGIOGRAPHY CHEST WITH CONTRAST TECHNIQUE: Multidetector CT imaging of the chest was performed using the standard protocol during bolus administration of intravenous contrast. Multiplanar CT image reconstructions and MIPs were obtained to evaluate the vascular anatomy. RADIATION DOSE REDUCTION: This exam was performed according to the departmental dose-optimization program which includes automated exposure control, adjustment of the mA and/or kV according to patient size and/or use of iterative reconstruction technique. CONTRAST:  75mL OMNIPAQUE  IOHEXOL  350 MG/ML SOLN COMPARISON:  Radiograph 05/22/2024 FINDINGS: Cardiovascular: No pericardial effusion. Normal caliber aorta without dissection. Negative for acute pulmonary embolism. Mediastinum/Nodes: Trachea and esophagus are unremarkable. No thoracic adenopathy. Lungs/Pleura: Right greater than left lower lobe atelectasis. No pleural effusion or pneumothorax. Upper Abdomen:  Interval placement of a percutaneous drain in the cholecystectomy bed since abdomen pelvis CT 05/22/2024. A gas and fluid collection is redemonstrated in the cholecystectomy bed. Musculoskeletal: No acute fracture. Review of the MIP images confirms the above findings. IMPRESSION: 1. Negative for acute pulmonary embolism. 2. Interval placement of a percutaneous drain in the gas and fluid collection in the cholecystectomy bed compared to CT abdomen pelvis 05/22/2024. Electronically Signed   By: Norman Gatlin M.D.   On: 05/23/2024 19:14   CT GUIDED VISCERAL FLUID DRAIN BY PERC CATH Result Date: 05/23/2024 INDICATION: [id number] Intraabdominal fluid collection [id number] Briefly, 73 year old male with a history of cholecystectomy 04/28/2024 presenting with gallbladder fossa abscess EXAM: CT-GUIDED ABSCESS DRAINAGE CATHETER PLACEMENT INTO A GALLBLADDER FOSSA ABSCESS COMPARISON:  CT AP, 05/22/2024 MEDICATIONS: The patient is currently admitted to the hospital and receiving intravenous antibiotics. The antibiotics were administered within an appropriate time frame prior to the initiation of the procedure. ANESTHESIA/SEDATION: Moderate (conscious) sedation was employed during this procedure. A total of Versed  2 mg and Fentanyl  100 mcg was administered intravenously. Moderate Sedation Time: 11 minutes. The patient's level of consciousness and vital signs were monitored  continuously by radiology nursing throughout the procedure under my direct supervision. CONTRAST:  None FLUOROSCOPY TIME:  CT dose; 334 mGycm COMPLICATIONS: None immediate. PROCEDURE: RADIATION DOSE REDUCTION: This exam was performed according to the departmental dose-optimization program which includes automated exposure control, adjustment of the mA and/or kV according to patient size and/or use of iterative reconstruction technique. Informed written consent was obtained from the patient after a discussion of the risks, benefits and alternatives to treatment.  The patient was placed supine on the CT gantry and a pre procedural CT was performed re-demonstrating the known abscess/fluid collection within the gallbladder fossa. The procedure was planned. A timeout was performed prior to the initiation of the procedure. The RIGHT upper quadrant was prepped and draped in the usual sterile fashion. The overlying soft tissues were anesthetized with 1% lidocaine  with epinephrine . Appropriate trajectory was planned with the use of a 22 gauge spinal needle. An 18 gauge trocar needle was advanced into the abscess/fluid collection and a short Amplatz super stiff wire was coiled within the collection. Appropriate positioning was confirmed with a limited CT scan. The tract was serially dilated allowing placement of a 10 Fr drainage catheter. Appropriate positioning was confirmed with a limited postprocedural CT scan. 5 mL of purulent fluid was aspirated. The tube was connected to bulb suction and sutured in place. A dressing was placed. The patient tolerated the procedure well without immediate post procedural complication. IMPRESSION: Successful CT-guided placement of a 10 Fr drainage catheter into the gallbladder fossa with aspiration of 5 mL of purulent fluid. Samples were sent to the laboratory as requested by the ordering clinical team. RECOMMENDATIONS: The patient will return to [facility] ([facility]) for routine drainage catheter evaluation and exchange in 7 days. Thom Hall, MD [facility] Electronically Signed   By: Thom Hall M.D.   On: 05/23/2024 15:30    Assessment & Plan: Abscess in gallbladder fossa - status post cholecystectomy 04/28/2024 by Dr. Rubin - admitted to medical service - Continue IV Zosyn  - percutaneous drainage by IR successful, 6/28 - WBC improved to 11.K today (6/30) - Currently on carb modified diet and tolerating    Carolle Ishii,PA-C 05/25/2024  Patient ID: Benjamin Patton, male   DOB: 03-18-1951, 73 y.o.   MRN: [id number]

## 2024-05-26 ENCOUNTER — Other Ambulatory Visit (HOSPITAL_COMMUNITY): Payer: Self-pay

## 2024-05-26 DIAGNOSIS — K651 Peritoneal abscess: Secondary | ICD-10-CM | POA: Diagnosis not present

## 2024-05-26 DIAGNOSIS — T8143XA Infection following a procedure, organ and space surgical site, initial encounter: Secondary | ICD-10-CM | POA: Diagnosis not present

## 2024-05-26 LAB — COMPREHENSIVE METABOLIC PANEL WITH GFR
ALT: 52 U/L — ABNORMAL HIGH (ref 0–44)
AST: 41 U/L (ref 15–41)
Albumin: 2.4 g/dL — ABNORMAL LOW (ref 3.5–5.0)
Alkaline Phosphatase: 151 U/L — ABNORMAL HIGH (ref 38–126)
Anion gap: 11 (ref 5–15)
BUN: 7 mg/dL — ABNORMAL LOW (ref 8–23)
CO2: 26 mmol/L (ref 22–32)
Calcium: 9 mg/dL (ref 8.9–10.3)
Chloride: 103 mmol/L (ref 98–111)
Creatinine, Ser: 0.96 mg/dL (ref 0.61–1.24)
GFR, Estimated: >60 mL/min (ref >60)
Glucose, Bld: 102 mg/dL — ABNORMAL HIGH (ref 70–99)
Potassium: 3.6 mmol/L (ref 3.5–5.1)
Sodium: 140 mmol/L (ref 135–145)
Total Bilirubin: 0.8 mg/dL (ref 0.0–1.2)
Total Protein: 7.3 g/dL (ref 6.5–8.1)

## 2024-05-26 LAB — CBC
HCT: 36.3 % — ABNORMAL LOW (ref 39.0–52.0)
Hemoglobin: 12.1 g/dL — ABNORMAL LOW (ref 13.0–17.0)
MCH: 31.8 pg (ref 26.0–34.0)
MCHC: 33.3 g/dL (ref 30.0–36.0)
MCV: 95.3 fL (ref 80.0–100.0)
Platelets: 459 10*3/uL — ABNORMAL HIGH (ref 150–400)
RBC: 3.81 MIL/uL — ABNORMAL LOW (ref 4.22–5.81)
RDW: 12.7 % (ref 11.5–15.5)
WBC: 11.4 10*3/uL — ABNORMAL HIGH (ref 4.0–10.5)
nRBC: 0 % (ref 0.0–0.2)

## 2024-05-26 LAB — GLUCOSE, CAPILLARY
Glucose-Capillary: 138 mg/dL — ABNORMAL HIGH (ref 70–99)
Glucose-Capillary: 117 mg/dL — ABNORMAL HIGH (ref 70–99)

## 2024-05-26 MED ORDER — OXYCODONE HCL 5 MG PO TABS
5.0000 mg | ORAL_TABLET | Freq: Four times a day (QID) | ORAL | 0 refills | Status: AC | PRN
  Filled 2024-05-26: qty 12, 3d supply, fill #0

## 2024-05-26 MED ORDER — AMOXICILLIN-POT CLAVULANATE 875-125 MG PO TABS
1.0000 | ORAL_TABLET | Freq: Two times a day (BID) | ORAL | 0 refills | Status: AC
  Filled 2024-05-26: qty 8, 4d supply, fill #0

## 2024-05-26 MED ORDER — SODIUM CHLORIDE FLUSH 0.9 % IV SOLN
10.0000 mL | INTRAVENOUS | 0 refills | Status: AC | PRN
  Filled 2024-05-26: qty 300, 30d supply, fill #0

## 2024-05-26 NOTE — Discharge Summary (Signed)
 Physician Discharge Summary  Benjamin Patton FMW:[id number] DOB: 23-Aug-1951 DOA: 05/22/2024  PCP: Norleen Lynwood LELON, MD  Admit date: 05/22/2024 Discharge date: 05/26/2024  Time spent: 40 minutes  Recommendations for Outpatient Follow-up:  Follow outpatient CBC/CMP  Follow with IR outpatient  Follow with surgery outpatient   Discharge Diagnoses:  Principal Problem:   Postprocedural intraabdominal abscess [facility]) Active Problems:   Diabetes (HCC)   HYPERCHOLESTEROLEMIA   Essential hypertension   Elevated transaminase level   Discharge Condition: stable  Diet recommendation: diabetic, HH  Filed Weights   05/23/24 0400  Weight: 77.6 kg    History of present illness:  No notes on file  Hospital Course:  Assessment and Plan:  1. Intraabdominal abscess  - Hx of gangrenous cholecystitis s/p cholecystectomy with IOC on 6/3 - CT 6/28 with 6.4x7.3x6.4 cm abscess/complex collection in the cholecystectomy bed - now s/p CT guided drain placement by IR - IR recommending repeat imaging/possible drain injection when output less than 10 cc/day - plan for outpatient IR follow up - follow drain culture NG - follow blood cultures NGx3 - Continue Zosyn  - will d/c with augmentin  - appreciate surgery recommendations   # Shortness of Breath - suspect splinting related to above process  - echo with preserved EF - CT PE protocol without PE   2. Hypertension  - Continue amlodipine  and ACE-i    3. Elevated transaminases  - No biliary dilatation on CT in ED  - improving, trend    4. Type II DM  - A1c was 8.2% in June 2025  - resume home meds     Procedures: Echo IMPRESSIONS     1. Left ventricular ejection fraction, by estimation, is 60 to 65%. The  left ventricle has normal function. The left ventricle has no regional  wall motion abnormalities. Left ventricular diastolic parameters are  consistent with Grade I diastolic  dysfunction (impaired relaxation).   2. Right ventricular  systolic function is normal. The right ventricular  size is normal. Tricuspid regurgitation signal is inadequate for assessing  PA pressure.   3. The mitral valve is normal in structure. Trivial mitral valve  regurgitation. No evidence of mitral stenosis.   4. The aortic valve is tricuspid. Aortic valve regurgitation is not  visualized. No aortic stenosis is present.    IMPRESSION: Successful CT-guided placement of Tyquarius Paglia 10 Fr drainage catheter into the gallbladder fossa with aspiration of 5 mL of purulent fluid.   Samples were sent to the laboratory as requested by the ordering clinical team.   RECOMMENDATIONS: The patient will return to Vascular Interventional Radiology (VIR) for routine drainage catheter evaluation and exchange in 7 days.  Consultations: IR surgery  Discharge Exam: Vitals:   05/26/24 0531 05/26/24 0844  BP: (!) 120/91 133/83  Pulse: 62 72  Resp: 18 17  Temp: 98.8 F (37.1 C) 98.6 F (37 C)  SpO2: 100% 96%   No complaints Eager for home - pain improving Wife at bedside, understands drain care  General: No acute distress. Cardiovascular: RRR Lungs: unlabored Abdomen: Soft, nontender, nondistended - drain in place Neurological: Alert and oriented 3. Moves all extremities 4 with equal strength. Cranial nerves II through XII grossly intact. Extremities: No clubbing or cyanosis. No edema  Discharge Instructions   Discharge Instructions     Call MD for:  difficulty breathing, headache or visual disturbances   Complete by: As directed    Call MD for:  extreme fatigue   Complete by: As directed  Call MD for:  hives   Complete by: As directed    Call MD for:  persistant dizziness or light-headedness   Complete by: As directed    Call MD for:  persistant nausea and vomiting   Complete by: As directed    Call MD for:  redness, tenderness, or signs of infection (pain, swelling, redness, odor or green/yellow discharge around incision site)   Complete  by: As directed    Call MD for:  severe uncontrolled pain   Complete by: As directed    Call MD for:  temperature >100.4   Complete by: As directed    Diet - low sodium heart healthy   Complete by: As directed    Discharge instructions   Complete by: As directed    You were seen for an infection.  You had an infected fluid collection at the site of your prior surgery.  We've treated that now with antibiotics and drainage.  You'll need to follow interventional radiology instructions for drain care.  We'll send you with Jaasia Viglione few more days of antibiotics.    We'll send you with Dailan Pfalzgraf few days of pain medicine.  Return for new, recurrent, or worsening symptoms.  Please ask your PCP to request records from this hospitalization so they know what was done and what the next steps will be.   Discharge wound care:   Complete by: As directed    Per IR   Increase activity slowly   Complete by: As directed       Allergies as of 05/26/2024       Reactions   Metformin  And Related Other (See Comments)   feels bad        Medication List     STOP taking these medications    HYDROcodone -acetaminophen  5-325 MG tablet Commonly known as: NORCO/VICODIN       TAKE these medications    amLODipine -benazepril  10-20 MG capsule Commonly known as: LOTREL Take 1 capsule by mouth daily.   amoxicillin -clavulanate 875-125 MG tablet Commonly known as: AUGMENTIN  Take 1 tablet by mouth 2 (two) times daily for 4 days.   cyclobenzaprine  10 MG tablet Commonly known as: FLEXERIL  Take 1 tablet (10 mg total) by mouth at bedtime.   famotidine  20 MG tablet Commonly known as: PEPCID  Take 1 tablet (20 mg total) by mouth 2 (two) times daily. What changed:  when to take this reasons to take this   glipiZIDE  10 MG 24 hr tablet Commonly known as: GLUCOTROL  XL Take 1 tablet (10 mg total) by mouth every morning.   ibuprofen 200 MG tablet Commonly known as: ADVIL Take 400 mg by mouth 2 (two) times daily  as needed for headache or moderate pain (pain score 4-6).   Lantus  SoloStar 100 UNIT/ML Solostar Pen Generic drug: insulin  glargine Inject 30 Units into the skin daily. E11.9   oxyCODONE  5 MG immediate release tablet Commonly known as: Oxy IR/ROXICODONE  Take 1 tablet (5 mg total) by mouth every 6 (six) hours as needed for up to 3 days for moderate pain (pain score 4-6).   pantoprazole  20 MG tablet Commonly known as: PROTONIX  Take 1 tablet (20 mg total) by mouth daily. What changed:  when to take this reasons to take this   pioglitazone  45 MG tablet Commonly known as: Actos  Take 1 tablet (45 mg total) by mouth daily.   polyethylene glycol 17 g packet Commonly known as: MIRALAX  / GLYCOLAX  Take 17 g by mouth daily as needed.   rosuvastatin   20 MG tablet Commonly known as: Crestor  Take 1 tablet (20 mg total) by mouth daily. What changed: when to take this   sodium chloride  flush 0.9 % Soln injection Flush catheter with 5-10cc saline daily to prevent clogging.   VITAMIN D -3 PO Take 1 capsule by mouth daily.               Discharge Care Instructions  (From admission, onward)           Start     Ordered   05/26/24 0000  Discharge wound care:       Comments: Per IR   05/26/24 1148           Allergies  Allergen Reactions   Metformin  And Related Other (See Comments)    feels bad    Follow-up Information     Maczis, Puja Gosai, PA-C Follow up on 06/11/2024.   Specialty: General Surgery Why: 2:30 PM., bring Mckenzey Parcell copy of your photo ID and insurance card, arrive 30 minutes prior to your appointment. Contact information: [address] [facility] [place] [address] [phone]                  The results of significant diagnostics from this hospitalization (including imaging, microbiology, ancillary and laboratory) are listed below for reference.    Significant Diagnostic Studies: ECHOCARDIOGRAM  COMPLETE Result Date: 05/24/2024    ECHOCARDIOGRAM REPORT   Patient Name:   LEEAM CEDRONE Date of Exam: 05/24/2024 Medical Rec #:  [id number]        Height:       70.0 in Accession #:    [id number]       Weight:       171.1 lb Date of Birth:  [birth year]       BSA:          1.953 m Patient Age:    72 years         BP:           112/75 mmHg Patient Gender: M                HR:           105 bpm. Exam Location:  Inpatient Procedure: 2D Echo, Cardiac Doppler and Color Doppler (Both Spectral and Color            Flow Doppler were utilized during procedure). Indications:    R01.1 Murmur  History:        Patient has prior history of Echocardiogram examinations, most                 recent 04/13/2015. Signs/Symptoms:Chest Pain and Murmur; Risk                 Factors:Hypertension, Diabetes and Dyslipidemia. Intraabdominal                 abscess.  Sonographer:    Ellouise Mose RDCS Referring Phys: [phone] Jhalil Silvera CALDWELL POWELL JR IMPRESSIONS  1. Left ventricular ejection fraction, by estimation, is 60 to 65%. The left ventricle has normal function. The left ventricle has no regional wall motion abnormalities. Left ventricular diastolic parameters are consistent with Grade I diastolic dysfunction (impaired relaxation).  2. Right ventricular systolic function is normal. The right ventricular size is normal. Tricuspid regurgitation signal is inadequate for assessing PA pressure.  3. The mitral valve is normal in structure. Trivial mitral valve regurgitation. No evidence of mitral stenosis.  4. The  aortic valve is tricuspid. Aortic valve regurgitation is not visualized. No aortic stenosis is present. FINDINGS  Left Ventricle: Left ventricular ejection fraction, by estimation, is 60 to 65%. The left ventricle has normal function. The left ventricle has no regional wall motion abnormalities. The left ventricular internal cavity size was normal in size. There is  no left ventricular hypertrophy. Left ventricular diastolic parameters are  consistent with Grade I diastolic dysfunction (impaired relaxation). Right Ventricle: The right ventricular size is normal. No increase in right ventricular wall thickness. Right ventricular systolic function is normal. Tricuspid regurgitation signal is inadequate for assessing PA pressure. Left Atrium: Left atrial size was normal in size. Right Atrium: Right atrial size was normal in size. Pericardium: There is no evidence of pericardial effusion. Mitral Valve: The mitral valve is normal in structure. Trivial mitral valve regurgitation. No evidence of mitral valve stenosis. Tricuspid Valve: The tricuspid valve is normal in structure. Tricuspid valve regurgitation is trivial. No evidence of tricuspid stenosis. Aortic Valve: The aortic valve is tricuspid. Aortic valve regurgitation is not visualized. No aortic stenosis is present. Pulmonic Valve: The pulmonic valve was normal in structure. Pulmonic valve regurgitation is trivial. No evidence of pulmonic stenosis. Aorta: The aortic root and ascending aorta are structurally normal, with no evidence of dilitation. IAS/Shunts: The interatrial septum appears to be lipomatous. No atrial level shunt detected by color flow Doppler.  LEFT VENTRICLE PLAX 2D LVIDd:         4.50 cm     Diastology LVIDs:         3.10 cm     LV e' medial:    5.98 cm/s LV PW:         1.00 cm     LV E/e' medial:  8.6 LV IVS:        1.00 cm     LV e' lateral:   8.49 cm/s LVOT diam:     2.30 cm     LV E/e' lateral: 6.0 LV SV:         82 LV SV Index:   42 LVOT Area:     4.15 cm  LV Volumes (MOD) LV vol d, MOD A2C: 73.1 ml LV vol d, MOD A4C: 75.3 ml LV vol s, MOD A2C: 22.3 ml LV vol s, MOD A4C: 33.0 ml LV SV MOD A2C:     50.8 ml LV SV MOD A4C:     75.3 ml LV SV MOD BP:      48.5 ml RIGHT VENTRICLE             IVC RV S prime:     14.80 cm/s  IVC diam: 1.50 cm TAPSE (M-mode): 1.6 cm LEFT ATRIUM             Index        RIGHT ATRIUM          Index LA Vol (A2C):   16.1 ml 8.24 ml/m   RA Area:     6.79 cm  LA Vol (A4C):   28.7 ml 14.69 ml/m  RA Volume:   8.49 ml  4.35 ml/m LA Biplane Vol: 21.3 ml 10.90 ml/m  AORTIC VALVE LVOT Vmax:   126.00 cm/s LVOT Vmean:  79.700 cm/s LVOT VTI:    0.198 m  AORTA Ao Root diam: 3.50 cm Ao Asc diam:  3.70 cm MITRAL VALVE MV Area (PHT): 4.13 cm    SHUNTS MV Decel Time: 184 msec    Systemic VTI:  0.20 m  MV E velocity: 51.20 cm/s  Systemic Diam: 2.30 cm MV Sharada Albornoz velocity: 80.70 cm/s MV E/Laurell Coalson ratio:  0.63 Dorn Ross MD Electronically signed by Dorn Ross MD Signature Date/Time: 05/24/2024/3:49:57 PM    Final    CT Angio Chest Pulmonary Embolism (PE) W or WO Contrast Result Date: 05/23/2024 CLINICAL DATA:  Shortness of breath EXAM: CT ANGIOGRAPHY CHEST WITH CONTRAST TECHNIQUE: Multidetector CT imaging of the chest was performed using the standard protocol during bolus administration of intravenous contrast. Multiplanar CT image reconstructions and MIPs were obtained to evaluate the vascular anatomy. RADIATION DOSE REDUCTION: This exam was performed according to the departmental dose-optimization program which includes automated exposure control, adjustment of the mA and/or kV according to patient size and/or use of iterative reconstruction technique. CONTRAST:  75mL OMNIPAQUE  IOHEXOL  350 MG/ML SOLN COMPARISON:  Radiograph 05/22/2024 FINDINGS: Cardiovascular: No pericardial effusion. Normal caliber aorta without dissection. Negative for acute pulmonary embolism. Mediastinum/Nodes: Trachea and esophagus are unremarkable. No thoracic adenopathy. Lungs/Pleura: Right greater than left lower lobe atelectasis. No pleural effusion or pneumothorax. Upper Abdomen: Interval placement of Melvin Whiteford percutaneous drain in the cholecystectomy bed since abdomen pelvis CT 05/22/2024. Siddhartha Hoback gas and fluid collection is redemonstrated in the cholecystectomy bed. Musculoskeletal: No acute fracture. Review of the MIP images confirms the above findings. IMPRESSION: 1. Negative for acute pulmonary embolism. 2.  Interval placement of Jermani Eberlein percutaneous drain in the gas and fluid collection in the cholecystectomy bed compared to CT abdomen pelvis 05/22/2024. Electronically Signed   By: Norman Gatlin M.D.   On: 05/23/2024 19:14   CT GUIDED VISCERAL FLUID DRAIN BY PERC CATH Result Date: 05/23/2024 INDICATION: [id number] Intraabdominal fluid collection [id number] Briefly, 73 year old male with Grayson White history of cholecystectomy 04/28/2024 presenting with gallbladder fossa abscess EXAM: CT-GUIDED ABSCESS DRAINAGE CATHETER PLACEMENT INTO Krystl Wickware GALLBLADDER FOSSA ABSCESS COMPARISON:  CT AP, 05/22/2024 MEDICATIONS: The patient is currently admitted to the hospital and receiving intravenous antibiotics. The antibiotics were administered within an appropriate time frame prior to the initiation of the procedure. ANESTHESIA/SEDATION: Moderate (conscious) sedation was employed during this procedure. Ausar Georgiou total of Versed  2 mg and Fentanyl  100 mcg was administered intravenously. Moderate Sedation Time: 11 minutes. The patient's level of consciousness and vital signs were monitored continuously by radiology nursing throughout the procedure under my direct supervision. CONTRAST:  None FLUOROSCOPY TIME:  CT dose; 334 mGycm COMPLICATIONS: None immediate. PROCEDURE: RADIATION DOSE REDUCTION: This exam was performed according to the departmental dose-optimization program which includes automated exposure control, adjustment of the mA and/or kV according to patient size and/or use of iterative reconstruction technique. Informed written consent was obtained from the patient after Keon Pender discussion of the risks, benefits and alternatives to treatment. The patient was placed supine on the CT gantry and Kemiya Batdorf pre procedural CT was performed re-demonstrating the known abscess/fluid collection within the gallbladder fossa. The procedure was planned. Iysha Mishkin timeout was performed prior to the initiation of the procedure. The RIGHT upper quadrant was prepped and draped in the usual  sterile fashion. The overlying soft tissues were anesthetized with 1% lidocaine  with epinephrine . Appropriate trajectory was planned with the use of Omari Koslosky 22 gauge spinal needle. An 18 gauge trocar needle was advanced into the abscess/fluid collection and Laurella Tull short Amplatz super stiff wire was coiled within the collection. Appropriate positioning was confirmed with Muhannad Bignell limited CT scan. The tract was serially dilated allowing placement of Ugonna Keirsey 10 Fr drainage catheter. Appropriate positioning was confirmed with Kaylyne Axton limited postprocedural CT scan. 5 mL of purulent fluid was aspirated. The  tube was connected to bulb suction and sutured in place. Lola Lofaro dressing was placed. The patient tolerated the procedure well without immediate post procedural complication. IMPRESSION: Successful CT-guided placement of Olufemi Mofield 10 Fr drainage catheter into the gallbladder fossa with aspiration of 5 mL of purulent fluid. Samples were sent to the laboratory as requested by the ordering clinical team. RECOMMENDATIONS: The patient will return to Vascular Interventional Radiology (VIR) for routine drainage catheter evaluation and exchange in 7 days. Thom Hall, MD [facility] Electronically Signed   By: Thom Hall M.D.   On: 05/23/2024 15:30   DG Chest Port 1 View Result Date: 05/22/2024 CLINICAL DATA:  Questionable sepsis - evaluate for abnormality EXAM: PORTABLE CHEST - 1 VIEW COMPARISON:  May 19, 2024 FINDINGS: Low lung volumes with streaky atelectasis or scarring in the right lung base. No focal airspace consolidation, pleural effusion, or pneumothorax. No cardiomegaly. Tortuous aorta. No acute fracture or destructive lesion. Multilevel thoracic osteophytosis. IMPRESSION: No acute cardiopulmonary abnormality. Electronically Signed   By: Rogelia Myers M.D.   On: 05/22/2024 18:52   CT ABDOMEN PELVIS W CONTRAST Result Date: 05/22/2024 CLINICAL DATA:  Abdominal pain, acute, nonlocalized. EXAM:  CT ABDOMEN AND PELVIS WITH CONTRAST TECHNIQUE: Multidetector CT imaging of the abdomen and pelvis was performed using the standard protocol following bolus administration of intravenous contrast. RADIATION DOSE REDUCTION: This exam was performed according to the departmental dose-optimization program which includes automated exposure control, adjustment of the mA and/or kV according to patient size and/or use of iterative reconstruction technique. CONTRAST:  OMNIPAQUE  IOHEXOL  300 MG/ML  SOLN COMPARISON:  CT scan abdomen pelvis from 04/25/2024. FINDINGS: Lower chest: There are patchy atelectatic changes in the visualized lung bases. No overt consolidation. No pleural effusion. The heart is normal in size. No pericardial effusion. Hepatobiliary: The liver is normal in size. Non-cirrhotic configuration. No suspicious mass. No intrahepatic or extrahepatic bile duct dilation. As per the electronic medical record, patient underwent cholecystectomy on 04/28/2024. In the cholecystectomy bed, there is Cherelle Midkiff thick irregular multi septated walled-off collection measuring 6.4 x 7.3 x 6.4 cm (anteroposterior x transverse x craniocaudal), when measured including the walls. The collection contains moderate amount of air and multiple air-fluid levels. There is mild-to-moderate surrounding fat stranding. Findings are compatible with abscess/complex collection in the cholecystectomy bed. There is loss of fat planes and reactive thickening of the anti mesenteric wall of the hepatic flexure of colon. Pancreas: Unremarkable. No pancreatic ductal dilatation or surrounding inflammatory changes. Spleen: Surgically absent. Accessory splenules noted in the left upper quadrant, posteriorly. Adrenals/Urinary Tract: Adrenal glands are unremarkable. No suspicious renal mass. Redemonstration of multiple simple cysts in bilateral kidneys with largest arising from the left kidney lower pole, posteriorly measuring 5.4 x 5.8 cm. No  nephroureterolithiasis or obstructive uropathy on either side. 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 Urinary bladder is under distended, precluding optimal assessment. However, no large mass or stones identified. No perivesical fat stranding. Stomach/Bowel: No disproportionate dilation of the small or large bowel loops. The appendix is unremarkable. There is asymmetric thickening of the anti mesenteric wall of the hepatic flexure of colon due to proximity with cholecystectomy bed abscess/collection. Vascular/Lymphatic: No ascites or pneumoperitoneum. No abdominal or pelvic lymphadenopathy, by size criteria. No aneurysmal dilation of the major abdominal arteries. Reproductive: Enlarged prostate. Symmetric seminal vesicles. Other: Periumbilical surgical scar noted. There is also probable prior right-sided orchiectomy versus hernia repair. Correlate with surgical history. The soft tissues  and abdominal wall are otherwise unremarkable. Musculoskeletal: No suspicious osseous lesions. There are mild multilevel degenerative changes in the visualized spine. Old healed fracture deformity of lower sacrum noted. There is also old healed deformity of bilateral superior/inferior pubic rami and pubic symphysis. IMPRESSION: 1. There is Deloria Brassfield 6.4 x 7.3 x 6.4 cm abscess/complex collection in the cholecystectomy bed. There is loss of fat planes and reactive thickening of the anti mesenteric wall of the hepatic flexure of colon. 2. Multiple other nonacute observations, as described above. Electronically Signed   By: Ree Molt M.D.   On: 05/22/2024 14:14   DG Chest 2 View Result Date: 05/19/2024 CLINICAL DATA:  Postop fever, recent cholecystectomy EXAM: CHEST - 2 VIEW COMPARISON:  04/30/2024 FINDINGS: Linear subsegmental atelectasis at the right lung base. Left lung clear. Heart and mediastinal contours are within normal limits. No effusions or pneumothorax. No acute bony abnormality.  IMPRESSION: Right base subsegmental atelectasis. No active disease. Electronically Signed   By: Franky Crease M.D.   On: 05/19/2024 15:00   DG Lumbar Spine 2-3 Views Result Date: 05/19/2024 CLINICAL DATA:  Lumbar pain, postop recent cholecystectomy. EXAM: LUMBAR SPINE - 2-3 VIEW COMPARISON:  CT abdomen and pelvis 04/25/2024 FINDINGS: Advanced degenerative facet disease diffusely, most pronounced at L4-5 and L5-S1. Moderate degenerative disc disease with anterior spurring throughout the lumbar spine. No fracture or subluxation. SI joints symmetric and unremarkable. IMPRESSION: Degenerative disc and facet disease. No acute bony abnormality. Electronically Signed   By: Franky Crease M.D.   On: 05/19/2024 14:59   DG ABD ACUTE 2+V W 1V CHEST Result Date: 04/30/2024 CLINICAL DATA:  Postoperative abdominal distention. EXAM: DG ABDOMEN ACUTE WITH 1 VIEW CHEST COMPARISON:  April 27, 2024. FINDINGS: Moderately dilated small bowel loops are noted concerning for distal small bowel obstruction or possibly ileus. No colonic dilatation is noted. Moderate amount of stool seen in the right colon. Phleboliths are noted in the pelvis. Mild right basilar atelectasis is noted. Cardiomediastinal silhouette is unremarkable. IMPRESSION: Mild right basilar atelectasis. Moderately dilated small bowel loops are noted concerning for distal small bowel obstruction or possibly ileus. No colonic dilatation. Moderate stool burden seen in right colon. Electronically Signed   By: Lynwood Landy Raddle M.D.   On: 04/30/2024 09:17   US  Abdomen Limited RUQ (LIVER/GB) Result Date: 04/27/2024 CLINICAL DATA:  [id number] RUQ abdominal pain [id number] EXAM: ULTRASOUND ABDOMEN LIMITED RIGHT UPPER QUADRANT COMPARISON:  04/25/2024. FINDINGS: Gallbladder: Wall thickening. Echogenic sludge. No pericholecystic fluid. No shadowing stones. Common bile duct: Diameter: 0.4 cm. Liver: Liver is hyperechoic consistent with fatty infiltration. No focal hepatic lesions identified.  No intrahepatic ductal dilatation. Hepatopetal portal vein flow. IMPRESSION: Gallbladder sludge and wall thickening. Fatty infiltration of the liver. Electronically Signed   By: Fonda Field M.D.   On: 04/27/2024 19:16   DG Chest 1 View Result Date: 04/27/2024 CLINICAL DATA:  [id number] SOB (shortness of breath) [id number] EXAM: CHEST  1 VIEW COMPARISON:  November 08, 2020 FINDINGS: Low lung volumes with streaky bibasilar atelectasis. No lobar consolidation, pleural effusion, or pneumothorax. No cardiomegaly. No acute fracture or destructive lesion. Multilevel thoracic osteophytosis. IMPRESSION: Low lung volumes with streaky bibasilar atelectasis. Electronically Signed   By: Rogelia Myers M.D.   On: 04/27/2024 18:14    Microbiology: Recent Results (from the past 240 hours)  Urine Culture     Status: None   Collection Time: 05/19/24  2:31 PM   Specimen: Urine  Result Value Ref Range Status   Source: URINE  Final   Status: FINAL  Final   Result: No Growth  Final  Blood Culture (routine x 2)     Status: None (Preliminary result)   Collection Time: 05/22/24  7:25 PM   Specimen: BLOOD  Result Value Ref Range Status   Specimen Description   Final    BLOOD RIGHT ANTECUBITAL Performed at [facility], [address], [place], [place] [address]    Special Requests   Final    BOTTLES DRAWN AEROBIC AND ANAEROBIC Blood Culture adequate volume Performed at [facility], [address] [place], [place] [address]    Culture   Final    NO GROWTH 4 DAYS Performed at [facility], [address], [place], [place] [address]    Report Status PENDING  Incomplete  Blood Culture (routine x 2)     Status: None (Preliminary result)   Collection Time: 05/22/24  7:28 PM   Specimen: BLOOD  Result Value Ref Range Status   Specimen Description   Final    BLOOD LEFT ANTECUBITAL Performed at [facility], [address] [place], [place]  [address]    Special Requests   Final    BOTTLES DRAWN AEROBIC AND ANAEROBIC Blood Culture adequate volume Performed at [facility], [address], [place], [place] [address]    Culture   Final    NO GROWTH 4 DAYS Performed at [facility], [address] [place], [place] [address]    Report Status PENDING  Incomplete  Aerobic/Anaerobic Culture w Gram Stain (surgical/deep wound)     Status: None (Preliminary result)   Collection Time: 05/23/24 11:56 AM   Specimen: Abscess  Result Value Ref Range Status   Specimen Description ABSCESS  Final   Special Requests ABSCESS  Final   Gram Stain   Final    MODERATE WBC PRESENT,BOTH PMN AND MONONUCLEAR NO ORGANISMS SEEN    Culture   Final    NO GROWTH 2 DAYS Performed at [facility], [address], [place], [place] [address]    Report Status PENDING  Incomplete     Labs: Basic Metabolic Panel: Recent Labs  Lab 05/22/24 1905 05/23/24 0856 05/24/24 0934 05/25/24 0700 05/26/24 0641  NA 138 139 138 138 140  K 3.9 3.4* 3.6 3.4* 3.6  CL 101 104 101 101 103  CO2 24 22 23 25 26   GLUCOSE 154* 100* 99 126* 102*  BUN 18 11 9  7* 7*  CREATININE 1.13 0.98 0.95 0.94 0.96  CALCIUM  10.1 8.9 9.2 8.9 9.0   Liver Function Tests: Recent Labs  Lab 05/22/24 1905 05/23/24 0856 05/24/24 0934 05/25/24 0700 05/26/24 0641  AST 110* 82* 73* 46* 41  ALT 127* 87* 81* 60* 52*  ALKPHOS 279* 182* 175* 151* 151*  BILITOT 0.6 1.2 1.1 0.9 0.8  PROT 8.5* 7.2 7.1 7.1 7.3  ALBUMIN 3.6 2.4* 2.3* 2.3* 2.4*   No results for input(s): LIPASE, AMYLASE in the last 168 hours. No results for input(s): AMMONIA in the last 168 hours. CBC: Recent Labs  Lab 05/22/24 1905 05/23/24 0856 05/24/24 0934 05/25/24 0700 05/26/24 0641  WBC 15.2* 14.7* 11.3* 11.0* 11.4*  NEUTROABS 10.0*  --   --   --   --   HGB 12.4* 11.7* 12.0* 11.7* 12.1*  HCT 37.4* 35.5* 36.6* 35.7* 36.3*  MCV 94.9 96.2 95.8 94.4 95.3  PLT 521* 502* 491*  459* 459*   Cardiac Enzymes: No results for input(s):  CKTOTAL, CKMB, CKMBINDEX, TROPONINI in the last 168 hours. BNP: BNP (last 3 results) No results for input(s): BNP in the last 8760 hours.  ProBNP (last 3 results) No results for input(s): PROBNP in the last 8760 hours.  CBG: Recent Labs  Lab 05/25/24 0850 05/25/24 1241 05/25/24 1618 05/25/24 2157 05/26/24 0846  GLUCAP 190* 159* 164* 142* 138*       Signed:  Meliton Monte MD.  [facility] 05/26/2024, 11:49 AM

## 2024-05-26 NOTE — Plan of Care (Signed)

## 2024-05-26 NOTE — Progress Notes (Signed)
 Explained discharge instructions to patient. Reviewed follow up appointment and next medication administration times. Also reviewed education. Patient verbalized having an understanding for instructions given. All belongings are in the patient's possession to include TOC meds. IV was removed. No other needs verbalized. Volunteer services will transport downstairs for discharge.

## 2024-05-26 NOTE — Progress Notes (Signed)
 Chief Complaint: Subhepatic abscess following cholecystectomy  Subjective: Patient has c/o mild pain around drain, otherwise pain is well managed. Patient not in acute distress. Pt remain HDS. No c/o N/V,denies BM but still endorses flatus.   Objective: Vital signs in last 24 hours: Temp:  [98.6 F (37 C)-98.8 F (37.1 C)] 98.6 F (37 C) (07/01 0844) Pulse Rate:  [62-80] 72 (07/01 0844) Resp:  [17-18] 17 (07/01 0844) BP: (118-133)/(71-91) 133/83 (07/01 0844) SpO2:  [95 %-100 %] 96 % (07/01 0844) Last BM Date : 05/21/24  Intake/Output from previous day: 06/30 0701 - 07/01 0700 In: 981.3 [P.O.:594; I.V.:8; IV Piggyback:374.3] Out: 15 [Drains:15] Intake/Output this shift: No intake/output data recorded.  Physical Exam: HEENT - sclerae clear, mucous membranes moist Abdomen - soft, no mass, mild tenderness; drain remains with minimal purulent output in bulb.  Lab Results:  Recent Labs    05/25/24 0700 05/26/24 0641  WBC 11.0* 11.4*  HGB 11.7* 12.1*  HCT 35.7* 36.3*  PLT 459* 459*   BMET Recent Labs    05/25/24 0700 05/26/24 0641  NA 138 140  K 3.4* 3.6  CL 101 103  CO2 25 26  GLUCOSE 126* 102*  BUN 7* 7*  CREATININE 0.94 0.96  CALCIUM  8.9 9.0   PT/INR No results for input(s): LABPROT, INR in the last 72 hours.  Comprehensive Metabolic Panel:    Component Value Date/Time   NA 140 05/26/2024 0641   NA 138 05/25/2024 0700   K 3.6 05/26/2024 0641   K 3.4 (L) 05/25/2024 0700   CL 103 05/26/2024 0641   CL 101 05/25/2024 0700   CO2 26 05/26/2024 0641   CO2 25 05/25/2024 0700   BUN 7 (L) 05/26/2024 0641   BUN 7 (L) 05/25/2024 0700   CREATININE 0.96 05/26/2024 0641   CREATININE 0.94 05/25/2024 0700   CREATININE 0.94 04/07/2015 1015   CREATININE 0.82 09/05/2013 1050   GLUCOSE 102 (H) 05/26/2024 0641   GLUCOSE 126 (H) 05/25/2024 0700   CALCIUM  9.0 05/26/2024 0641   CALCIUM  8.9 05/25/2024 0700   AST 41 05/26/2024 0641   AST 46 (H)  05/25/2024 0700   ALT 52 (H) 05/26/2024 0641   ALT 60 (H) 05/25/2024 0700   ALKPHOS 151 (H) 05/26/2024 0641   ALKPHOS 151 (H) 05/25/2024 0700   BILITOT 0.8 05/26/2024 0641   BILITOT 0.9 05/25/2024 0700   PROT 7.3 05/26/2024 0641   PROT 7.1 05/25/2024 0700   ALBUMIN 2.4 (L) 05/26/2024 0641   ALBUMIN 2.3 (L) 05/25/2024 0700    Studies/Results: ECHOCARDIOGRAM COMPLETE Result Date: 05/24/2024    ECHOCARDIOGRAM REPORT   Patient Name:   Benjamin Patton Date of Exam: 05/24/2024 Medical Rec #:  [id number]        Height:       70.0 in Accession #:    [id number]       Weight:       171.1 lb Date of Birth:  1950/11/28       BSA:          1.953 m Patient Age:    72 years         BP:           112/75 mmHg Patient Gender: M                HR:           105 bpm. Exam  Location:  Inpatient Procedure: 2D Echo, Cardiac Doppler and Color Doppler (Both Spectral and Color            Flow Doppler were utilized during procedure). Indications:    R01.1 Murmur  History:        Patient has prior history of Echocardiogram examinations, most                 recent 04/13/2015. Signs/Symptoms:Chest Pain and Murmur; Risk                 Factors:Hypertension, Diabetes and Dyslipidemia. Intraabdominal                 abscess.  Sonographer:    Ellouise Mose RDCS Referring Phys: [id number] A CALDWELL POWELL JR IMPRESSIONS  1. Left ventricular ejection fraction, by estimation, is 60 to 65%. The left ventricle has normal function. The left ventricle has no regional wall motion abnormalities. Left ventricular diastolic parameters are consistent with Grade I diastolic dysfunction (impaired relaxation).  2. Right ventricular systolic function is normal. The right ventricular size is normal. Tricuspid regurgitation signal is inadequate for assessing PA pressure.  3. The mitral valve is normal in structure. Trivial mitral valve regurgitation. No evidence of mitral stenosis.  4. The aortic valve is tricuspid. Aortic valve regurgitation is not  visualized. No aortic stenosis is present. FINDINGS  Left Ventricle: Left ventricular ejection fraction, by estimation, is 60 to 65%. The left ventricle has normal function. The left ventricle has no regional wall motion abnormalities. The left ventricular internal cavity size was normal in size. There is  no left ventricular hypertrophy. Left ventricular diastolic parameters are consistent with Grade I diastolic dysfunction (impaired relaxation). Right Ventricle: The right ventricular size is normal. No increase in right ventricular wall thickness. Right ventricular systolic function is normal. Tricuspid regurgitation signal is inadequate for assessing PA pressure. Left Atrium: Left atrial size was normal in size. Right Atrium: Right atrial size was normal in size. Pericardium: There is no evidence of pericardial effusion. Mitral Valve: The mitral valve is normal in structure. Trivial mitral valve regurgitation. No evidence of mitral valve stenosis. Tricuspid Valve: The tricuspid valve is normal in structure. Tricuspid valve regurgitation is trivial. No evidence of tricuspid stenosis. Aortic Valve: The aortic valve is tricuspid. Aortic valve regurgitation is not visualized. No aortic stenosis is present. Pulmonic Valve: The pulmonic valve was normal in structure. Pulmonic valve regurgitation is trivial. No evidence of pulmonic stenosis. Aorta: The aortic root and ascending aorta are structurally normal, with no evidence of dilitation. IAS/Shunts: The interatrial septum appears to be lipomatous. No atrial level shunt detected by color flow Doppler.  LEFT VENTRICLE PLAX 2D LVIDd:         4.50 cm     Diastology LVIDs:         3.10 cm     LV e' medial:    5.98 cm/s LV PW:         1.00 cm     LV E/e' medial:  8.6 LV IVS:        1.00 cm     LV e' lateral:   8.49 cm/s LVOT diam:     2.30 cm     LV E/e' lateral: 6.0 LV SV:         82 LV SV Index:   42 LVOT Area:     4.15 cm  LV Volumes (MOD) LV vol d, MOD A2C: 73.1 ml LV  vol d, MOD A4C: 75.3  ml LV vol s, MOD A2C: 22.3 ml LV vol s, MOD A4C: 33.0 ml LV SV MOD A2C:     50.8 ml LV SV MOD A4C:     75.3 ml LV SV MOD BP:      48.5 ml RIGHT VENTRICLE             IVC RV S prime:     14.80 cm/s  IVC diam: 1.50 cm TAPSE (M-mode): 1.6 cm LEFT ATRIUM             Index        RIGHT ATRIUM          Index LA Vol (A2C):   16.1 ml 8.24 ml/m   RA Area:     6.79 cm LA Vol (A4C):   28.7 ml 14.69 ml/m  RA Volume:   8.49 ml  4.35 ml/m LA Biplane Vol: 21.3 ml 10.90 ml/m  AORTIC VALVE LVOT Vmax:   126.00 cm/s LVOT Vmean:  79.700 cm/s LVOT VTI:    0.198 m  AORTA Ao Root diam: 3.50 cm Ao Asc diam:  3.70 cm MITRAL VALVE MV Area (PHT): 4.13 cm    SHUNTS MV Decel Time: 184 msec    Systemic VTI:  0.20 m MV E velocity: 51.20 cm/s  Systemic Diam: 2.30 cm MV A velocity: 80.70 cm/s MV E/A ratio:  0.63 Dorn Ross MD Electronically signed by Dorn Ross MD Signature Date/Time: 05/24/2024/3:49:57 PM    Final     Assessment & Plan: Abscess in gallbladder fossa - status post cholecystectomy 04/28/2024 by Dr. Rubin  - admitted to medical service - IV Zosyn  - percutaneous drainage by IR successful, 6/28 - WBC 11.4K today (7/1) - Currently on carb modified diet and tolerating   From a surgical standpoint, patient is stable for discharge.  Spoke with Dr. Perri outside room who will start the process.  Patient already has follow-up with IR in drain clinic as well as follow-up in our office.  Eulah Samyah Bilbo,PA-C 05/26/2024  Patient ID: Benjamin Patton, male   DOB: Feb 07, 1951, 73 y.o.   MRN: [id number]

## 2024-05-27 ENCOUNTER — Other Ambulatory Visit: Payer: Self-pay | Admitting: Student

## 2024-05-27 ENCOUNTER — Telehealth: Payer: Self-pay | Admitting: *Deleted

## 2024-05-27 DIAGNOSIS — K651 Peritoneal abscess: Secondary | ICD-10-CM

## 2024-05-27 LAB — AEROBIC/ANAEROBIC CULTURE W GRAM STAIN (SURGICAL/DEEP WOUND): Culture: NO GROWTH

## 2024-05-27 LAB — CULTURE, BLOOD (ROUTINE X 2)
Culture: NO GROWTH
Culture: NO GROWTH
Special Requests: ADEQUATE
Special Requests: ADEQUATE

## 2024-05-27 NOTE — Transitions of Care (Post Inpatient/ED Visit) (Signed)
   05/27/2024  Name: IZAIH KATAOKA MRN: [id number] DOB: December 09, 1950  Today's TOC FU Call Status: Today's TOC FU Call Status:: Unsuccessful Call (1st Attempt) Unsuccessful Call (3rd Attempt) Date: 05/27/24  Attempted to reach the patient regarding the most recent Inpatient/ED visit.  Contacted phone number set as default in EHR: daughter reports this is her number (she is not verified on [facility] DPR- self only 08/28/2018); she requested that I call patient on his mobile number: [phone]  Left HIPAA compliant voice message requesting call back  Follow Up Plan: Additional outreach attempts will be made to reach the patient to complete the Transitions of Care (Post Inpatient/ED visit) call.   Pls call/ message for questions,  Kallen Mccrystal Mckinney Lameisha Schuenemann, RN, BSN, CCRN Alumnus RN Care Manager  Transitions of Care  [facility] - [facility] [phone]: direct office

## 2024-05-28 ENCOUNTER — Telehealth: Payer: Self-pay | Admitting: *Deleted

## 2024-05-28 NOTE — Transitions of Care (Post Inpatient/ED Visit) (Signed)
 05/28/2024  Name: Benjamin Patton MRN: [id number] DOB: 20-Jul-1951  Today's TOC FU Call Status: Today's TOC FU Call Status:: Successful TOC FU Call Completed TOC FU Call Complete Date: 05/28/24 Patient's Name and Date of Birth confirmed.  Transition Care Management Follow-up Telephone Call Date of Discharge: 05/26/24 Discharge Facility: [facility]) Type of Discharge: Inpatient Admission Primary Inpatient Discharge Diagnosis:: intaabdominal abscess post- previous medical treatment for acute cholecystectomy 04/28/24 How have you been since you were released from the hospital?: Better (I am doing better I guess; they keep getting my treatment wrong at the hospital which is why I have to keep going back.  Not real happy with the care I have received at your hospital, and I hope I am finally getting better this time) Any questions or concerns?: Yes Patient Questions/Concerns:: Patient and girlfriend express concerns around patient's prior treatment at hospital: state they have mis-diagnosed him several times; state we have already talked to them about this Patient Questions/Concerns Addressed: Other: (Service recovery measures provided)  Items Reviewed: Did you receive and understand the discharge instructions provided?: Yes (thoroughly reviewed with patient and his girlfriend who verbalizes good understanding of same) Medications obtained,verified, and reconciled?: Yes (Medications Reviewed) (Full medication reconciliation/ review completed; no concerns or discrepancies identified; confirmed patient obtained/ is taking all newly Rx'd medications as instructed; self- manages medications and denies questions/ concerns around medications today) Any new allergies since your discharge?: No Dietary orders reviewed?: Yes Type of Diet Ordered:: Healthy as possible; Regular food Do you have support at home?: Yes People in Home [RPT]: alone Name of Support/Comfort Primary Source: Reports  independent in self-care activities; normally resides alone; temporarily staying with my lady friend; [place]--  assists as/ if needed/ indicated  Medications Reviewed Today: Medications Reviewed Today     Reviewed by Mayra Brahm M, RN (Registered Nurse) on 05/28/24 at 1611  Med List Status: <None>   Medication Order Taking? Sig Documenting Provider Last Dose Status Informant  amLODipine -benazepril  (LOTREL) 10-20 MG capsule [id number] Yes Take 1 capsule by mouth daily. [provider]  Active Self, Pharmacy Records  amoxicillin -clavulanate (AUGMENTIN ) 875-125 MG tablet [id number] Yes Take 1 tablet by mouth 2 (two) times daily for 4 days. Perri DELENA Meliton Mickey., MD  Active   Cholecalciferol  (VITAMIN D -3 PO) [id number] Yes Take 1 capsule by mouth daily. [provider]  Active Self, Pharmacy Records  cyclobenzaprine  (FLEXERIL ) 10 MG tablet [id number] Yes Take 1 tablet (10 mg total) by mouth at bedtime.  Patient taking differently: Take 10 mg by mouth at bedtime. 05/28/24: reports during [facility] call has not yet started taking- has at home, but has not yet started   Sagardia, Miguel Jose, MD  Active Self, Pharmacy Records  famotidine  (PEPCID ) 20 MG tablet [id number]  Take 1 tablet (20 mg total) by mouth 2 (two) times daily.  Patient not taking: Reported on 05/28/2024   [name], [name], PA-C  Active Self, Pharmacy Records  glipiZIDE  (GLUCOTROL  XL) 10 MG 24 hr tablet [id number] Yes Take 1 tablet (10 mg total) by mouth every morning. Norleen Lynwood LELON, MD  Active Self, Pharmacy Records  ibuprofen (ADVIL) 200 MG tablet [id number]  Take 400 mg by mouth 2 (two) times daily as needed for headache or moderate pain (pain score 4-6).  Patient not taking: Reported on 05/28/2024   [provider]  Active Self, Pharmacy Records  insulin  glargine (LANTUS  SOLOSTAR) 100 UNIT/ML Solostar Pen [id number] Yes Inject 30 Units into the skin daily. E11.9  Patient taking differently: Inject 30 Units into the  skin daily. E11.9  reports during TOC call 05/28/24- currently taking 20 U QD   John, James W, MD  Active Self, Pharmacy Records  oxyCODONE  (OXY IR/ROXICODONE ) 5 MG immediate release tablet [id number] Yes Take 1 tablet (5 mg total) by mouth every 6 (six) hours as needed for up to 3 days for moderate pain (pain score 4-6). Perri DELENA Meliton Mickey., MD  Active   pantoprazole  (PROTONIX ) 20 MG tablet [id number]  Take 1 tablet (20 mg total) by mouth daily.  Patient not taking: Reported on 05/28/2024   [name], [name], PA-C  Active Self, Pharmacy Records  pioglitazone  (ACTOS ) 45 MG tablet [id number] Yes Take 1 tablet (45 mg total) by mouth daily. Norleen Lynwood ORN, MD  Active Self, Pharmacy Records  polyethylene glycol (MIRALAX  / GLYCOLAX ) 17 g packet [id number] Yes Take 17 g by mouth daily as needed. Vicci Burnard SAUNDERS, PA-C  Active Self, Pharmacy Records  rosuvastatin  (CRESTOR ) 20 MG tablet [id number] Yes Take 1 tablet (20 mg total) by mouth daily. Norleen Lynwood ORN, MD  Active Self, Pharmacy Records           Med Note (LEE, NICOLE   Tue Apr 28, 2024  7:02 AM)    sodium chloride  flush 0.9 % SOLN injection [id number] Yes Flush catheter with 5-10cc saline daily to prevent clogging. Perri DELENA Meliton Mickey., MD  Active            Home Care and Equipment/Supplies: Were Home Health Services Ordered?: No Any new equipment or medical supplies ordered?: No  Functional Questionnaire: Do you need assistance with bathing/showering or dressing?: No Do you need assistance with meal preparation?: Yes (My lady friend helps make my food) Do you need assistance with eating?: No Do you have difficulty maintaining continence: No Do you need assistance with getting out of bed/getting out of a chair/moving?: No Do you have difficulty managing or taking your medications?: Yes (My lady friend helps me with my medicines)  Follow up appointments reviewed: PCP Follow-up appointment confirmed?: Yes (care coordination outreach in  real-time with scheduling care guide to successfully schedule hospital follow up PCP appointment 06/02/24) Date of PCP follow-up appointment?: 06/02/24 Follow-up Provider: PCP Specialist Hospital Follow-up appointment confirmed?: Yes Date of Specialist follow-up appointment?: 06/03/24 Follow-Up Specialty Provider:: Has appointments on 06/03/24 with IR and cardiology providers Do you need transportation to your follow-up appointment?: No Do you understand care options if your condition(s) worsen?: Yes-patient verbalized understanding  SDOH Interventions Today    Flowsheet Row Most Recent Value  SDOH Interventions   Food Insecurity Interventions Intervention Not Indicated  Housing Interventions Intervention Not Indicated  Transportation Interventions Intervention Not Indicated  [Reports drives self at baseline,  girlfriend and daughter assisting post- recent hospital admissions]  Utilities Interventions Intervention Not Indicated   Patient declines enrollment in 30-day TOC program- declines taking my direct phone number should needs/ concerns arise post-TOC call   See TOC assessment tabs for additional assessment/ TOC intervention information  Pls call/ message for questions,  Uno Esau Mckinney Jshaun Abernathy, RN, BSN, CCRN Alumnus RN Care Manager  Transitions of Care  [facility] - [facility] [phone]: direct office

## 2024-06-01 ENCOUNTER — Ambulatory Visit: Admitting: Cardiology

## 2024-06-02 ENCOUNTER — Ambulatory Visit: Payer: Self-pay | Admitting: Internal Medicine

## 2024-06-02 ENCOUNTER — Encounter: Payer: Self-pay | Admitting: *Deleted

## 2024-06-02 ENCOUNTER — Ambulatory Visit: Admitting: Internal Medicine

## 2024-06-02 ENCOUNTER — Encounter: Payer: Self-pay | Admitting: Internal Medicine

## 2024-06-02 VITALS — BP 98/62 | HR 103 | Temp 98.4°F | Ht 70.0 in | Wt 177.2 lb

## 2024-06-02 DIAGNOSIS — T8143XD Infection following a procedure, organ and space surgical site, subsequent encounter: Secondary | ICD-10-CM | POA: Diagnosis not present

## 2024-06-02 DIAGNOSIS — K651 Peritoneal abscess: Secondary | ICD-10-CM | POA: Diagnosis not present

## 2024-06-02 LAB — HEPATIC FUNCTION PANEL
ALT: 30 U/L (ref 0–53)
AST: 23 U/L (ref 0–37)
Albumin: 3.8 g/dL (ref 3.5–5.2)
Alkaline Phosphatase: 150 U/L — ABNORMAL HIGH (ref 39–117)
Bilirubin, Direct: 0.1 mg/dL (ref 0.0–0.3)
Total Bilirubin: 0.5 mg/dL (ref 0.2–1.2)
Total Protein: 7.9 g/dL (ref 6.0–8.3)

## 2024-06-02 LAB — CBC WITH DIFFERENTIAL/PLATELET
Basophils Absolute: 0.1 K/uL (ref 0.0–0.1)
Basophils Relative: 0.6 % (ref 0.0–3.0)
Eosinophils Absolute: 0.1 K/uL (ref 0.0–0.7)
Eosinophils Relative: 1 % (ref 0.0–5.0)
HCT: 39.6 % (ref 39.0–52.0)
Hemoglobin: 13 g/dL (ref 13.0–17.0)
Lymphocytes Relative: 26.6 % (ref 12.0–46.0)
Lymphs Abs: 3.4 K/uL (ref 0.7–4.0)
MCHC: 32.8 g/dL (ref 30.0–36.0)
MCV: 94.3 fl (ref 78.0–100.0)
Monocytes Absolute: 0.9 K/uL (ref 0.1–1.0)
Monocytes Relative: 6.9 % (ref 3.0–12.0)
Neutro Abs: 8.2 K/uL — ABNORMAL HIGH (ref 1.4–7.7)
Neutrophils Relative %: 64.9 % (ref 43.0–77.0)
Platelets: 515 K/uL — ABNORMAL HIGH (ref 150.0–400.0)
RBC: 4.2 Mil/uL — ABNORMAL LOW (ref 4.22–5.81)
RDW: 13.8 % (ref 11.5–15.5)
WBC: 12.7 K/uL — ABNORMAL HIGH (ref 4.0–10.5)

## 2024-06-02 LAB — BASIC METABOLIC PANEL WITH GFR
BUN: 16 mg/dL (ref 6–23)
CO2: 27 meq/L (ref 19–32)
Calcium: 9.4 mg/dL (ref 8.4–10.5)
Chloride: 100 meq/L (ref 96–112)
Creatinine, Ser: 0.86 mg/dL (ref 0.40–1.50)
GFR: 87.04 mL/min (ref >60.00)
Glucose, Bld: 276 mg/dL — ABNORMAL HIGH (ref 70–99)
Potassium: 4.2 meq/L (ref 3.5–5.1)
Sodium: 136 meq/L (ref 135–145)
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
## 2024-06-02 NOTE — Progress Notes (Signed)
 OFFICE NOTE:    Date:  06/03/2024  ID:  [name], DOB 10/01/1951, MRN [id number] PCP: Norleen Lynwood LELON, MD  [facility] Providers Cardiologist:  None        TTE 04/13/15: EF 55-60, no RWMA, Gr 1 DD, trace MR and trace TR CAC 02/26/23: 0 TTE 05/24/24: EF 60-65, no RWMA, Gr 1 DD, NL RVSF, trivial MR Hypertension  Hyperlipidemia Diabetes mellitus  A1c 14.9% Cholecystitis (gangrenous) s/p cholecystectomy 04/2024 Readmit w intraabd abscess s/p drain 04/2024        Discussed the use of AI scribe software for clinical note transcription with the patient, who gave verbal consent to proceed. History of Present Illness Benjamin Patton is a 73 y.o. male who is referred by Dr. Barbarann for evaluation of diastolic murmur. He was admitted in 04/2024 with acute gangrenous cholecystitis s/p cholecystectomy. Hospitalization c/b AKI which resolved prior to DC. Pt was noted to have persistent diastolic murmur and OP cardiology eval was recommended. He was readmitted with intra-abdominal abscess s/p drain. He had issues with shortness of breath and CT was neg for PE, TTE showed normal EF and no significant valvular disease.    He has no history of heart problems, heart attacks, or atrial fibrillation.  He experiences an intermittent itch sensation on the L side of his chest. This sensation has been present for several years. It was described in the note when he saw Dr. Burnard in 2016. No associated shortness of breath, sweating, nausea, or pressure-like sensations are present. Pressing on his L chest provides some relief. He has not had exertional chest pain, shortness of breath, unusual fatigue, syncope, orthopnea, paroxysmal nocturnal dyspnea, leg swelling.  His family history is significant for heart problems; his father died at 62 from hypertension-related heart failure, and his brother has had multiple heart attacks and has a defibrillator.  He does not smoke and drinks beer occasionally.     ROS-See HPI    Studies Reviewed:              Physical Exam:  VS:  BP 112/70   Pulse 99   Ht 5' 10 (1.778 m)   Wt 177 lb 6.4 oz (80.5 kg)   SpO2 97%   BMI 25.45 kg/m        Wt Readings from Last 3 Encounters:  06/03/24 177 lb 6.4 oz (80.5 kg)  06/02/24 177 lb 3.2 oz (80.4 kg)  05/23/24 171 lb 1.2 oz (77.6 kg)    Constitutional:      Appearance: Healthy appearance. Not in distress.  Neck:     Vascular: JVD normal.  Pulmonary:     Breath sounds: Normal breath sounds. No wheezing. No rales.  Cardiovascular:     Normal rate. Regular rhythm.     Murmurs: There is no murmur.  Edema:    Peripheral edema absent.  Abdominal:     Palpations: Abdomen is soft.       Assessment and Plan:    Assessment & Plan Essential hypertension The patient's blood pressure is controlled on his current regimen.  Continue current therapy.   Diastolic murmur He has no murmur on exam today at rest, with squatting or with hand grip. His echocardiogram in 04/2024 showed normal EF and no significant valvular heart disease. Question if his murmur was related to volume shifts in the setting of his acute illness. No further testing needed at this time.  Precordial chest pain He has a family history of  ischemic heart disease and significant risk factors including diabetes, hypertension, and hyperlipidemia. He does not have symptoms suggestive of ischemic heart disease. Previous calcium  score of zero in April 2024, indicating lower risk. However, CAC score does not rule out soft plaque. We discussed proceeding with an exercise treadmill test to screen for ischemic heart disease vs watchful waiting. He would like to proceed with an ETT.  - Schedule an exercise treadmill test       Informed Consent   Shared Decision Making/Informed Consent The risks [chest pain, shortness of breath, cardiac arrhythmias, dizziness, blood pressure fluctuations, myocardial infarction, stroke/transient ischemic attack, and  life-threatening complications (estimated to be 1 in 10,000)], benefits (risk stratification, diagnosing coronary artery disease, treatment guidance) and alternatives of an exercise tolerance test were discussed in detail with [name] and he agrees to proceed.     Dispo:  Return for follow up as needed based upon test results..  Signed, Glendia Ferrier, PA-C

## 2024-06-02 NOTE — Patient Instructions (Signed)
 Please continue all other medications as before, and refills have been done if requested.  Please have the pharmacy call with any other refills you may need.  Please keep your appointments with your specialists as you may have planned - IR tomorrow  Please go to the LAB at the blood drawing area for the tests to be done   You will be contacted by phone if any changes need to be made immediately.  Otherwise, you will receive a letter about your results with an explanation, but please check with MyChart first.  Please make an Appointment to return in Oct 3, or sooner if needed

## 2024-06-02 NOTE — Assessment & Plan Note (Signed)
 S/p drainage per IR with f/u planned tomorrow, clinically doing very well with near resolved pain and otherwise non toxic.  Tolerated augmentin  well.   Today for cbc and LFTs, BMP,   to f/u any worsening symptoms or concerns

## 2024-06-03 ENCOUNTER — Encounter: Payer: Self-pay | Admitting: Physician Assistant

## 2024-06-03 ENCOUNTER — Ambulatory Visit
Admission: RE | Admit: 2024-06-03 | Discharge: 2024-06-03 | Disposition: A | Discharge status: Home / Self Care | Source: Ambulatory Visit | Attending: Student

## 2024-06-03 ENCOUNTER — Ambulatory Visit
Admission: RE | Admit: 2024-06-03 | Discharge: 2024-06-03 | Disposition: A | Discharge status: Home / Self Care | Source: Ambulatory Visit | Attending: Student | Admitting: Student

## 2024-06-03 ENCOUNTER — Ambulatory Visit: Attending: Physician Assistant | Admitting: Physician Assistant

## 2024-06-03 ENCOUNTER — Inpatient Hospital Stay
Admission: RE | Admit: 2024-06-03 | Discharge: 2024-06-03 | Disposition: A | Discharge status: Home / Self Care | Source: Ambulatory Visit | Attending: Radiology

## 2024-06-03 VITALS — BP 112/70 | HR 99 | Ht 70.0 in | Wt 177.4 lb

## 2024-06-03 DIAGNOSIS — R072 Precordial pain: Secondary | ICD-10-CM

## 2024-06-03 DIAGNOSIS — I10 Essential (primary) hypertension: Secondary | ICD-10-CM | POA: Diagnosis not present

## 2024-06-03 DIAGNOSIS — I38 Endocarditis, valve unspecified: Secondary | ICD-10-CM | POA: Diagnosis not present

## 2024-06-03 DIAGNOSIS — T8143XA Infection following a procedure, organ and space surgical site, initial encounter: Secondary | ICD-10-CM

## 2024-06-03 DIAGNOSIS — Z4803 Encounter for change or removal of drains: Secondary | ICD-10-CM | POA: Diagnosis not present

## 2024-06-03 DIAGNOSIS — K651 Peritoneal abscess: Secondary | ICD-10-CM | POA: Diagnosis not present

## 2024-06-03 HISTORY — PX: IR RADIOLOGIST EVAL & MGMT: IMG5224

## 2024-06-03 MED ORDER — IOPAMIDOL (ISOVUE-300) INJECTION 61%
100.0000 mL | Freq: Once | INTRAVENOUS | Status: AC | PRN
  Administered 2024-06-03: 100 mL via INTRAVENOUS
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
## 2024-06-03 NOTE — Assessment & Plan Note (Signed)
The patient's blood pressure is controlled on his current regimen.  Continue current therapy.  

## 2024-06-03 NOTE — Patient Instructions (Signed)
 Medication Instructions:  Your physician recommends that you continue on your current medications as directed. Please refer to the Current Medication list given to you today.  *If you need a refill on your cardiac medications before your next appointment, please call your pharmacy*  Lab Work: None ordered  If you have labs (blood work) drawn today and your tests are completely normal, you will receive your results only by: MyChart Message (if you have MyChart) OR A paper copy in the mail If you have any lab test that is abnormal or we need to change your treatment, we will call you to review the results.  Testing/Procedures: Your physician has requested that you have an exercise tolerance test. For further information please visit [email or web address] Please also follow instruction sheet, BELOW:   - DO NOT EAT, DRINK, OR USE TOBACCO PRODUCTS WITHIN 4 HOURS OF THE TEST - DRESS PREPARED TO EXERCISE IN A COMFORTABLE, 2 PIECE CLOTHING OUTFIT AND WALKING SHOES - BRING ANY PRESCRIPTION MEDICATIONS WITH YOU  - NOTIFY THE OFFICE 24 HOURS IN ADVANCE IF YOU NEED TO CANCEL - CALL THE OFFICE [phone] IF YOU HAVE ANY QUESTIONS    Follow-Up: At [facility], you and your health needs are our priority.  As part of our continuing mission to provide you with exceptional heart care, our providers are all part of one team.  This team includes your primary Cardiologist (physician) and Advanced Practice Providers or APPs (Physician Assistants and Nurse Practitioners) who all work together to provide you with the care you need, when you need it.  Your next appointment:   AS NEEDED   Provider:   Glendia Ferrier, PA-C          We recommend signing up for the patient portal called MyChart.  Sign up information is provided on this After Visit Summary.  MyChart is used to connect with patients for Virtual Visits (Telemedicine).  Patients are able to view lab/test results, encounter notes, upcoming  appointments, etc.  Non-urgent messages can be sent to your provider as well.   To learn more about what you can do with MyChart, go to [email or web address].   Other Instructions

## 2024-06-16 ENCOUNTER — Other Ambulatory Visit: Payer: Self-pay | Admitting: Emergency Medicine

## 2024-06-16 DIAGNOSIS — M545 Low back pain, unspecified: Secondary | ICD-10-CM

## 2024-06-25 ENCOUNTER — Telehealth: Payer: Self-pay | Admitting: Pharmacy Technician

## 2024-06-25 NOTE — Progress Notes (Signed)
   06/25/2024  Patient ID: Benjamin Patton, male   DOB: 06/05/1951, 73 y.o.   MRN: [id number]  Patient engaged with clinical pharmacist for management of diabetes on 04/07/2024. Outreach by [employer] technician was requested.   Outreached patient to discuss diabetes medication management. Left voicemail for patient to return my call at their convenience.   Gleb Mcguire, CPhT [facility] Office: [phone] Email: [email or web address]

## 2024-06-29 ENCOUNTER — Telehealth: Payer: Self-pay | Admitting: Pharmacy Technician

## 2024-06-29 NOTE — Progress Notes (Signed)
   06/29/2024  Patient ID: Benjamin Patton, male   DOB: [birth year], 73 y.o.   MRN: [id number]  Patient engaged with clinical pharmacist for management of diabetes on 04/07/2024. Outreach by [employer] technician was requested.   Outreached patient to discuss diabetes medication management. Left voicemail for patient to return my call at their convenience.   Tarini Carrier, CPhT [facility] Office: [phone] Email: [email or web address]

## 2024-07-01 ENCOUNTER — Telehealth: Payer: Self-pay | Admitting: Pharmacy Technician

## 2024-07-01 NOTE — Progress Notes (Signed)
   07/01/2024  Patient ID: Benjamin Patton, male   DOB: [birth year], 73 y.o.   MRN: [id number]  Patient engaged with clinical pharmacist for management of diabetes on 04/07/2024. Outreach by [employer] technician was requested.   Outreached patient to discuss diabetes medication management. Left voicemail for patient to return my call at their convenience.   Tarryn Bogdan, CPhT [facility] Office: [phone] Email: [email or web address]

## 2024-07-12 ENCOUNTER — Other Ambulatory Visit: Payer: Self-pay | Admitting: Internal Medicine

## 2024-07-31 DIAGNOSIS — K5900 Constipation, unspecified: Secondary | ICD-10-CM | POA: Diagnosis not present

## 2024-07-31 DIAGNOSIS — S39012A Strain of muscle, fascia and tendon of lower back, initial encounter: Secondary | ICD-10-CM | POA: Diagnosis not present

## 2024-07-31 DIAGNOSIS — R109 Unspecified abdominal pain: Secondary | ICD-10-CM | POA: Diagnosis not present

## 2024-07-31 DIAGNOSIS — M47819 Spondylosis without myelopathy or radiculopathy, site unspecified: Secondary | ICD-10-CM | POA: Diagnosis not present

## 2024-08-21 ENCOUNTER — Encounter (HOSPITAL_COMMUNITY): Payer: Self-pay | Admitting: *Deleted

## 2024-08-28 ENCOUNTER — Ambulatory Visit: Admitting: Internal Medicine

## 2024-08-28 ENCOUNTER — Ambulatory Visit (HOSPITAL_COMMUNITY)
Admission: RE | Admit: 2024-08-28 | Discharge: 2024-08-28 | Disposition: A | Discharge status: Home / Self Care | Source: Ambulatory Visit | Attending: Physician Assistant | Admitting: Physician Assistant

## 2024-08-28 ENCOUNTER — Ambulatory Visit: Payer: Self-pay | Admitting: Physician Assistant

## 2024-08-28 DIAGNOSIS — R072 Precordial pain: Secondary | ICD-10-CM | POA: Diagnosis not present

## 2024-08-28 LAB — EXERCISE TOLERANCE TEST
Angina Index: 0
Duke Treadmill Score: 3
Estimated workload: 10.1
Exercise duration (min): 8 min
Exercise duration (sec): 0 s
MPHR: 149 {beats}/min
Peak HR: 171 {beats}/min
Percent HR: 114 %
RPE: 18
Rest HR: 90 {beats}/min
ST Depression (mm): 1 mm

## 2024-09-08 ENCOUNTER — Ambulatory Visit (INDEPENDENT_AMBULATORY_CARE_PROVIDER_SITE_OTHER)

## 2024-09-08 VITALS — Ht 70.5 in | Wt 185.0 lb

## 2024-09-08 DIAGNOSIS — K635 Polyp of colon: Secondary | ICD-10-CM | POA: Diagnosis not present

## 2024-09-08 DIAGNOSIS — Z0000 Encounter for general adult medical examination without abnormal findings: Secondary | ICD-10-CM | POA: Diagnosis not present
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
## 2024-09-08 NOTE — Patient Instructions (Addendum)
 Benjamin Patton,  Thank you for taking the time for your Medicare Wellness Visit. I appreciate your continued commitment to your health goals. Please review the care plan we discussed, and feel free to reach out if I can assist you further.  Medicare recommends these wellness visits once per year to help you and your care team stay ahead of potential health issues. These visits are designed to focus on prevention, allowing your provider to concentrate on managing your acute and chronic conditions during your regular appointments.  Please note that Annual Wellness Visits do not include a physical exam. Some assessments may be limited, especially if the visit was conducted virtually. If needed, we may recommend a separate in-person follow-up with your provider.  Ongoing Care Seeing your primary care provider every 3 to 6 months helps us  monitor your health and provide consistent, personalized care.   Referrals If a referral was made during today's visit and you haven't received any updates within two weeks, please contact the referred provider directly to check on the status.  Recommended Screenings:  Health Maintenance  Topic Date Due   Zoster (Shingles) Vaccine (1 of 2) Never done   Colon Cancer Screening  09/11/2023   Eye exam for diabetics  05/09/2024   Flu Shot  Never done   Complete foot exam   11/14/2024   Hemoglobin A1C  11/17/2024   Yearly kidney health urinalysis for diabetes  05/18/2025   Yearly kidney function blood test for diabetes  06/02/2025   Medicare Annual Wellness Visit  09/08/2025   DTaP/Tdap/Td vaccine (3 - Td or Tdap) 05/10/2031   Pneumococcal Vaccine for age over 1  Completed   Hepatitis C Screening  Completed   Meningitis B Vaccine  Aged Out   COVID-19 Vaccine  Discontinued       09/08/2024    3:54 PM  Advanced Directives  Does Patient Have a Medical Advance Directive? No  Would patient like information on creating a medical advance directive? No - Patient  declined   Advance Care Planning is important because it: Ensures you receive medical care that aligns with your values, goals, and preferences. Provides guidance to your family and loved ones, reducing the emotional burden of decision-making during critical moments.  Vision: Annual vision screenings are recommended for early detection of glaucoma, cataracts, and diabetic retinopathy. These exams can also reveal signs of chronic conditions such as diabetes and high blood pressure.  Dental: Annual dental screenings help detect early signs of oral cancer, gum disease, and other conditions linked to overall health, including heart disease and diabetes.

## 2024-10-13 ENCOUNTER — Encounter: Payer: Self-pay | Admitting: Internal Medicine

## 2024-10-13 NOTE — Progress Notes (Signed)
 Pharmacy Quality Measure Review  This patient is appearing on a report for being at risk of failing the adherence measure for hypertension (ACEi/ARB) medications this calendar year.   Medication: Amlodipine -Benazepril  10-20mg  Last fill date: 11/13 for 90 day supply  Insurance report was not up to date. No action needed at this time.   Angela Baalmann, PharmD [facility] Pharmacist

## 2024-12-04 ENCOUNTER — Ambulatory Visit: Admitting: Internal Medicine

## 2024-12-04 ENCOUNTER — Ambulatory Visit: Payer: Self-pay | Admitting: Internal Medicine

## 2024-12-04 ENCOUNTER — Other Ambulatory Visit: Payer: Self-pay | Admitting: Internal Medicine

## 2024-12-04 ENCOUNTER — Encounter: Payer: Self-pay | Admitting: Internal Medicine

## 2024-12-04 VITALS — BP 140/82 | HR 75 | Temp 98.1°F | Ht 70.5 in | Wt 196.0 lb

## 2024-12-04 DIAGNOSIS — Z7984 Long term (current) use of oral hypoglycemic drugs: Secondary | ICD-10-CM

## 2024-12-04 DIAGNOSIS — E7800 Pure hypercholesterolemia, unspecified: Secondary | ICD-10-CM

## 2024-12-04 DIAGNOSIS — Z125 Encounter for screening for malignant neoplasm of prostate: Secondary | ICD-10-CM

## 2024-12-04 DIAGNOSIS — E559 Vitamin D deficiency, unspecified: Secondary | ICD-10-CM | POA: Diagnosis not present

## 2024-12-04 DIAGNOSIS — E538 Deficiency of other specified B group vitamins: Secondary | ICD-10-CM

## 2024-12-04 DIAGNOSIS — E1165 Type 2 diabetes mellitus with hyperglycemia: Secondary | ICD-10-CM

## 2024-12-04 DIAGNOSIS — I10 Essential (primary) hypertension: Secondary | ICD-10-CM

## 2024-12-04 DIAGNOSIS — Z1211 Encounter for screening for malignant neoplasm of colon: Secondary | ICD-10-CM

## 2024-12-04 DIAGNOSIS — Z0001 Encounter for general adult medical examination with abnormal findings: Secondary | ICD-10-CM | POA: Diagnosis not present

## 2024-12-04 LAB — HEPATIC FUNCTION PANEL
ALT: 14 U/L (ref 3–53)
AST: 18 U/L (ref 5–37)
Albumin: 4.1 g/dL (ref 3.5–5.2)
Alkaline Phosphatase: 134 U/L — ABNORMAL HIGH (ref 39–117)
Bilirubin, Direct: 0.2 mg/dL (ref 0.1–0.3)
Total Bilirubin: 1.2 mg/dL (ref 0.2–1.2)
Total Protein: 7.4 g/dL (ref 6.0–8.3)

## 2024-12-04 LAB — VITAMIN B12: Vitamin B-12: 516 pg/mL (ref 211–911)

## 2024-12-04 LAB — URINALYSIS, ROUTINE W REFLEX MICROSCOPIC
Hgb urine dipstick: NEGATIVE
Ketones, ur: NEGATIVE
Leukocytes,Ua: NEGATIVE
Nitrite: NEGATIVE
Specific Gravity, Urine: 1.025 (ref 1.000–1.030)
Total Protein, Urine: NEGATIVE
Urine Glucose: 100 — AB
Urobilinogen, UA: 2 — AB (ref 0.0–1.0)
pH: 6 (ref 5.0–8.0)

## 2024-12-04 LAB — CBC WITH DIFFERENTIAL/PLATELET
Basophils Absolute: 0.1 K/uL (ref 0.0–0.1)
Basophils Relative: 0.6 % (ref 0.0–3.0)
Eosinophils Absolute: 0.2 K/uL (ref 0.0–0.7)
Eosinophils Relative: 1.9 % (ref 0.0–5.0)
HCT: 43.7 % (ref 39.0–52.0)
Hemoglobin: 14.8 g/dL (ref 13.0–17.0)
Lymphocytes Relative: 31.6 % (ref 12.0–46.0)
Lymphs Abs: 2.9 K/uL (ref 0.7–4.0)
MCHC: 33.9 g/dL (ref 30.0–36.0)
MCV: 93.5 fl (ref 78.0–100.0)
Monocytes Absolute: 0.9 K/uL (ref 0.1–1.0)
Monocytes Relative: 10 % (ref 3.0–12.0)
Neutro Abs: 5.1 K/uL (ref 1.4–7.7)
Neutrophils Relative %: 55.9 % (ref 43.0–77.0)
Platelets: 256 K/uL (ref 150.0–400.0)
RBC: 4.68 Mil/uL (ref 4.22–5.81)
RDW: 13.7 % (ref 11.5–15.5)
WBC: 9.2 K/uL (ref 4.0–10.5)

## 2024-12-04 LAB — BASIC METABOLIC PANEL WITH GFR
BUN: 11 mg/dL (ref 6–23)
CO2: 24 meq/L (ref 19–32)
Calcium: 9.2 mg/dL (ref 8.4–10.5)
Chloride: 104 meq/L (ref 96–112)
Creatinine, Ser: 0.94 mg/dL (ref 0.40–1.50)
GFR: 81.2 mL/min
Glucose, Bld: 161 mg/dL — ABNORMAL HIGH (ref 70–99)
Potassium: 3.6 meq/L (ref 3.5–5.1)
Sodium: 139 meq/L (ref 135–145)

## 2024-12-04 LAB — VITAMIN D 25 HYDROXY (VIT D DEFICIENCY, FRACTURES): VITD: 23.24 ng/mL — ABNORMAL LOW (ref 30.00–100.00)

## 2024-12-04 LAB — LIPID PANEL
Cholesterol: 128 mg/dL (ref 28–200)
HDL: 36.4 mg/dL — ABNORMAL LOW
LDL Cholesterol: 71 mg/dL (ref 10–99)
NonHDL: 91.66
Total CHOL/HDL Ratio: 4
Triglycerides: 104 mg/dL (ref 10.0–149.0)
VLDL: 20.8 mg/dL (ref 0.0–40.0)

## 2024-12-04 LAB — MICROALBUMIN / CREATININE URINE RATIO
Creatinine,U: 230.5 mg/dL
Microalb Creat Ratio: 10.1 mg/g (ref 0.0–30.0)
Microalb, Ur: 2.3 mg/dL — ABNORMAL HIGH (ref 0.7–1.9)

## 2024-12-04 LAB — HEMOGLOBIN A1C: Hgb A1c MFr Bld: 8.8 % — ABNORMAL HIGH (ref 4.6–6.5)

## 2024-12-04 LAB — TSH: TSH: 2.47 u[IU]/mL (ref 0.35–5.50)

## 2024-12-04 LAB — PSA: PSA: 2.25 ng/mL (ref 0.10–4.00)

## 2024-12-04 MED ORDER — LANTUS SOLOSTAR 100 UNIT/ML ~~LOC~~ SOPN: 40.0000 [IU] | PEN_INJECTOR | Freq: Every day | SUBCUTANEOUS | 11 refills | Status: AC

## 2024-12-04 NOTE — Assessment & Plan Note (Signed)
 Age and sex appropriate education and counseling updated with regular exercise and diet Referrals for preventative services - for eye exam referral, also colonoscopy Immunizations addressed - none needed Smoking counseling  - none needed Evidence for depression or other mood disorder - none significant Most recent labs reviewed. I have personally reviewed and have noted: 1) the patient's medical and social history 2) The patient's current medications and supplements 3) The patient's height, weight, and BMI have been recorded in the chart

## 2024-12-04 NOTE — Assessment & Plan Note (Signed)
 Last vitamin D  Lab Results  Component Value Date   VD25OH 15.85 (L) 05/18/2024   Low, to start oral replacement

## 2024-12-04 NOTE — Assessment & Plan Note (Signed)
 Lab Results  Component Value Date   LDLCALC 89 05/18/2024   Uncontrolled, but pt to continue current statin crestor  20 mg every day, declines change for now, for f/u lab today

## 2024-12-04 NOTE — Patient Instructions (Signed)
 Please continue all other medications as before, and refills have been done if requested.  Please have the pharmacy call with any other refills you may need.  Please continue your efforts at being more active, low cholesterol diet, and weight control.  You are otherwise up to date with prevention measures today.  Please keep your appointments with your specialists as you may have planned  You will be contacted regarding the referral for: Colonoscopy, and Eye doctor  Please go to the LAB at the blood drawing area for the tests to be done  You will be contacted by phone if any changes need to be made immediately.  Otherwise, you will receive a letter about your results with an explanation, but please check with MyChart first.  Please make an Appointment to return in 6 months, or sooner if needed
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
## 2024-12-04 NOTE — Assessment & Plan Note (Addendum)
 Without insulin , with hyperglycemia  Lab Results  Component Value Date   HGBA1C 8.2 (H) 05/18/2024   unocontrolled, has been working on better diet, pt to continue current medical treatment glucotrol  xl 10 every day, lantus  30 every day, actos  45 mg every day and f/u lab today

## 2024-12-04 NOTE — Assessment & Plan Note (Signed)
 BP Readings from Last 3 Encounters:  12/04/24 (!) 140/82  06/03/24 112/70  06/02/24 98/62   Uncontrolled here, but pt states controlled at home, pt to continue medical treatment lotrel 10 20, declines change today

## 2024-12-09 ENCOUNTER — Encounter: Payer: Self-pay | Admitting: Internal Medicine

## 2024-12-24 ENCOUNTER — Ambulatory Visit

## 2024-12-24 ENCOUNTER — Encounter: Payer: Self-pay | Admitting: Internal Medicine

## 2024-12-24 VITALS — Ht 70.5 in | Wt 190.0 lb

## 2024-12-24 DIAGNOSIS — Z860100 Personal history of colon polyps, unspecified: Secondary | ICD-10-CM

## 2024-12-24 MED ORDER — NA SULFATE-K SULFATE-MG SULF 17.5-3.13-1.6 GM/177ML PO SOLN: 1.0000 | Freq: Once | ORAL | 0 refills | Status: AC

## 2024-12-24 NOTE — Progress Notes (Signed)

## 2025-01-07 ENCOUNTER — Encounter: Admitting: Internal Medicine

## 2025-09-13 ENCOUNTER — Ambulatory Visit

## 2025-09-13 ENCOUNTER — Encounter: Admitting: Internal Medicine
